# Patient Record
Sex: Female | Born: 1995 | Race: Black or African American | Hispanic: No | Marital: Married | Smoking: Never smoker
Health system: Southern US, Community
[De-identification: ages and names within clinical notes are randomized; demographics above are authoritative.]

## PROBLEM LIST (undated history)

## (undated) DIAGNOSIS — D649 Anemia, unspecified: Secondary | ICD-10-CM

## (undated) DIAGNOSIS — L732 Hidradenitis suppurativa: Secondary | ICD-10-CM

## (undated) DIAGNOSIS — O139 Gestational [pregnancy-induced] hypertension without significant proteinuria, unspecified trimester: Secondary | ICD-10-CM

## (undated) DIAGNOSIS — G971 Other reaction to spinal and lumbar puncture: Secondary | ICD-10-CM

## (undated) HISTORY — PX: KNEE ARTHROSCOPY: SUR90

## (undated) HISTORY — DX: Other reaction to spinal and lumbar puncture: G97.1

---
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
## 2016-06-30 ENCOUNTER — Emergency Department (HOSPITAL_COMMUNITY)
Admission: EM | Admit: 2016-06-30 | Discharge: 2016-06-30 | Disposition: A | Discharge status: Home / Self Care | Payer: Medicaid Other | Attending: Emergency Medicine | Admitting: Emergency Medicine

## 2016-06-30 ENCOUNTER — Encounter (HOSPITAL_COMMUNITY): Payer: Self-pay | Admitting: *Deleted

## 2016-06-30 DIAGNOSIS — J029 Acute pharyngitis, unspecified: Secondary | ICD-10-CM | POA: Diagnosis not present

## 2016-06-30 DIAGNOSIS — R05 Cough: Secondary | ICD-10-CM | POA: Insufficient documentation

## 2016-06-30 DIAGNOSIS — R509 Fever, unspecified: Secondary | ICD-10-CM | POA: Diagnosis not present

## 2016-06-30 DIAGNOSIS — R69 Illness, unspecified: Secondary | ICD-10-CM

## 2016-06-30 DIAGNOSIS — R000 Tachycardia, unspecified: Secondary | ICD-10-CM | POA: Insufficient documentation

## 2016-06-30 DIAGNOSIS — R51 Headache: Secondary | ICD-10-CM | POA: Diagnosis not present

## 2016-06-30 DIAGNOSIS — J111 Influenza due to unidentified influenza virus with other respiratory manifestations: Secondary | ICD-10-CM

## 2016-06-30 HISTORY — DX: Hidradenitis suppurativa: L73.2

## 2016-06-30 LAB — I-STAT CG4 LACTIC ACID, ED: Lactic Acid, Venous: 0.62 mmol/L (ref 0.5–1.9)

## 2016-06-30 MED ORDER — ONDANSETRON HCL 4 MG/2ML IJ SOLN
4.0000 mg | Freq: Once | INTRAMUSCULAR | Status: AC
  Administered 2016-06-30: 4 mg via INTRAVENOUS
  Filled 2016-06-30: qty 2

## 2016-06-30 MED ORDER — IBUPROFEN 800 MG PO TABS
800.0000 mg | ORAL_TABLET | Freq: Once | ORAL | Status: AC
  Administered 2016-06-30: 800 mg via ORAL
  Filled 2016-06-30: qty 1

## 2016-06-30 MED ORDER — SODIUM CHLORIDE 0.9 % IV BOLUS (SEPSIS)
1000.0000 mL | Freq: Once | INTRAVENOUS | Status: AC
  Administered 2016-06-30: 1000 mL via INTRAVENOUS

## 2016-06-30 MED ORDER — ONDANSETRON HCL 4 MG PO TABS: 4.0000 mg | ORAL_TABLET | Freq: Four times a day (QID) | ORAL | 0 refills | Status: DC

## 2016-06-30 MED ORDER — BENZONATATE 100 MG PO CAPS: 200.0000 mg | ORAL_CAPSULE | Freq: Two times a day (BID) | ORAL | 0 refills | Status: DC | PRN

## 2016-06-30 MED ORDER — ACETAMINOPHEN 325 MG PO TABS
650.0000 mg | ORAL_TABLET | Freq: Once | ORAL | Status: AC | PRN
  Administered 2016-06-30: 650 mg via ORAL
  Filled 2016-06-30: qty 2

## 2016-06-30 MED ORDER — OSELTAMIVIR PHOSPHATE 75 MG PO CAPS: 75.0000 mg | ORAL_CAPSULE | Freq: Two times a day (BID) | ORAL | 0 refills | Status: DC

## 2016-06-30 NOTE — ED Triage Notes (Signed)
Pt c/o generalized body aches, fever, chills, cough, & HA onset yesterday, pt A&O x4

## 2016-06-30 NOTE — ED Provider Notes (Signed)
[facility]-EMERGENCY DEPT Provider Note   CSN: [id number] Arrival date & time: 06/30/16  [id number]     History   Chief Complaint Chief Complaint  Patient presents with  . Generalized Body Aches  . Fever    HPI Alexandra Collins is a 21 y.o. female.  Patient presents to the emergency department with chief complaint of flulike symptoms. She reports associated fevers, chills, sore throat, cough, generalized body aches, and headache. She reports sudden onset of symptoms yesterday. She has now tried taking anything to alleviate her symptoms. There are no modifying factors. She denies having gotten a flu shot this year. She denies any other associated symptoms.   The history is provided by the patient. No language interpreter was used.    Past Medical History:  Diagnosis Date  . Hydradenitis     There are no active problems to display for this patient.   Past Surgical History:  Procedure Laterality Date  . KNEE ARTHROSCOPY Right     OB History    No data available       Home Medications    Prior to Admission medications   Not on File    Family History No family history on file.  Social History Social History  Substance Use Topics  . Smoking status: Never Smoker  . Smokeless tobacco: Never Used  . Alcohol use No     Allergies   Penicillins and Sulfur   Review of Systems Review of Systems  Constitutional: Positive for chills and fever.  HENT: Positive for sore throat.   Respiratory: Positive for cough.   Neurological: Positive for headaches.  All other systems reviewed and are negative.    Physical Exam Updated Vital Signs BP 134/79   Pulse 108   Temp 101.7 F (38.7 C) (Oral)   Resp 16   Ht 5' 6.5" (1.689 m)   Wt 122.5 kg   LMP 05/31/2016   SpO2 97%   BMI 42.93 kg/m   Physical Exam Nursing note and vitals reviewed.  Constitutional: Appears well-developed and well-nourished. No distress.  HENT: Oropharynx is mildly erythematous, no tonsillar  exudates or abscess, airway intact. Eyes: Conjunctivae are normal. Pupils are equal, round, and reactive to light.  Neck: Normal range of motion and full passive range of motion without pain.  Cardiovascular: Mild tachycardia (108), normal rhythm and intact distal pulses.   Pulmonary/Chest: Effort normal and breath sounds normal. No stridor. No wheezes, rhonchi, or rales. Abdominal: Soft. There is no focal abdominal tenderness.  Musculoskeletal: Normal range of motion. Moves all extremities. Neurological: Pt is alert and oriented to person, place, and time. Sensation and strength grossly intact throughout. Skin: Skin is warm and dry. No rash noted. Pt is not diaphoretic.  Psychiatric: Normal mood and affect.    ED Treatments / Results  Labs (all labs ordered are listed, but only abnormal results are displayed) Labs Reviewed  I-STAT CG4 LACTIC ACID, ED    EKG  EKG Interpretation None       Radiology No results found.  Procedures Procedures (including critical care time)  Medications Ordered in ED Medications  acetaminophen (TYLENOL) tablet 650 mg (650 mg Oral Given 06/30/16 0812)  sodium chloride 0.9 % bolus 1,000 mL (1,000 mLs Intravenous New Bag/Given 06/30/16 0835)  sodium chloride 0.9 % bolus 1,000 mL (1,000 mLs Intravenous New Bag/Given 06/30/16 0835)  ibuprofen (ADVIL,MOTRIN) tablet 800 mg (800 mg Oral Given 06/30/16 0833)  ondansetron (ZOFRAN) injection 4 mg (4 mg Intravenous Given 06/30/16 0833)  Initial Impression / Assessment and Plan / ED Course  I have reviewed the triage vital signs and the nursing notes.  Pertinent labs & imaging results that were available during my care of the patient were reviewed by me and considered in my medical decision making (see chart for details).     Patient with flulike symptoms. She is noted to be febrile and tachycardic in triage. Will give fluids and antipyretics. Will reassess. Take acid ordered in triage is 0.62. Suspect  influenza. No evidence of septic shock.  Patient feels improved after 2 L of fluid. Her vital signs are normalizing. Patient appears appropriate for outpatient treatment. I will prescribe Tamiflu given that she is within the treatment window and would like to try this. Specific return precautions given.  Final Clinical Impressions(s) / ED Diagnoses   Final diagnoses:  Influenza-like illness    New Prescriptions New Prescriptions   BENZONATATE (TESSALON) 100 MG CAPSULE    Take 2 capsules (200 mg total) by mouth 2 (two) times daily as needed for cough.   ONDANSETRON (ZOFRAN) 4 MG TABLET    Take 1 tablet (4 mg total) by mouth every 6 (six) hours.   OSELTAMIVIR (TAMIFLU) 75 MG CAPSULE    Take 1 capsule (75 mg total) by mouth every 12 (twelve) hours.     Roxy Horseman, PA-C 06/30/16 1009    Canary Brim Tegeler, MD 06/30/16 2009

## 2016-10-25 ENCOUNTER — Encounter (HOSPITAL_COMMUNITY): Payer: Self-pay | Admitting: Emergency Medicine

## 2016-10-25 ENCOUNTER — Emergency Department (HOSPITAL_COMMUNITY)
Admission: EM | Admit: 2016-10-25 | Discharge: 2016-10-25 | Disposition: A | Discharge status: Home / Self Care | Payer: Medicaid Other | Attending: Emergency Medicine | Admitting: Emergency Medicine

## 2016-10-25 DIAGNOSIS — L509 Urticaria, unspecified: Secondary | ICD-10-CM | POA: Diagnosis not present

## 2016-10-25 DIAGNOSIS — Z7982 Long term (current) use of aspirin: Secondary | ICD-10-CM | POA: Diagnosis not present

## 2016-10-25 DIAGNOSIS — Z79899 Other long term (current) drug therapy: Secondary | ICD-10-CM | POA: Diagnosis not present

## 2016-10-25 DIAGNOSIS — R21 Rash and other nonspecific skin eruption: Secondary | ICD-10-CM | POA: Diagnosis present

## 2016-10-25 MED ORDER — PREDNISONE 20 MG PO TABS
60.0000 mg | ORAL_TABLET | Freq: Once | ORAL | Status: AC
Start: 2016-10-25 — End: 2016-10-25
  Administered 2016-10-25: 60 mg via ORAL
  Filled 2016-10-25: qty 3

## 2016-10-25 MED ORDER — DIPHENHYDRAMINE HCL 25 MG PO CAPS
25.0000 mg | ORAL_CAPSULE | Freq: Once | ORAL | Status: AC
  Administered 2016-10-25: 25 mg via ORAL
  Filled 2016-10-25: qty 1

## 2016-10-25 MED ORDER — PREDNISONE 20 MG PO TABS: 40.0000 mg | ORAL_TABLET | Freq: Every day | ORAL | 0 refills | Status: DC

## 2016-10-25 NOTE — Discharge Instructions (Signed)
Continue Benadryl 25mg  over the counter every 6 hours as needed Continue Prednisone for the next 4 days Return for worsening symptoms

## 2016-10-25 NOTE — ED Triage Notes (Signed)
Pt reports eating shrimp last night, woke up today with hives on LUE, L side of face.  Pt now reports itching to lower lip.  Reports hx of "itchy tongue" after eating shrimp in past.

## 2016-10-26 NOTE — ED Provider Notes (Signed)
[facility]PT Provider Note   CSN: [id number] Arrival date & time: 10/25/16  0703     History   Chief Complaint Chief Complaint  Patient presents with  . Urticaria   HPI Alexandra Collins is a 21 y.o. female who presents with a rash. Past medical history significant for shrimp allergy. She denies history of anaphylaxis but was prescribed an EpiPen as precaution. She states that last night she ate shrimp and this morning she woke up with hives on her arms much side of her face. She reports an itchy lower lip. She denies fever, throat swelling, inability to swallow, drooling, facial swelling, shortness of breath, wheezing, abdominal pain, nausea or vomiting. She has not taken any medicine prior to arrival  HPI  Past Medical History:  Diagnosis Date  . Hydradenitis     There are no active problems to display for this patient.   Past Surgical History:  Procedure Laterality Date  . KNEE ARTHROSCOPY Right     OB History    No data available       Home Medications    Prior to Admission medications   Medication Sig Start Date End Date Taking? Authorizing Provider  aspirin-sod bicarb-citric acid (ALKA-SELTZER) 325 MG TBEF tablet Take 325 mg by mouth every 6 (six) hours as needed (cold symptoms).   Yes [provider]  etonogestrel (NEXPLANON) 68 MG IMPL implant Inject 68 mg into the muscle once.   Yes [provider]  Multiple Vitamin (MULTIVITAMIN) tablet Take 1 tablet by mouth daily.   Yes [provider]  benzonatate (TESSALON) 100 MG capsule Take 2 capsules (200 mg total) by mouth 2 (two) times daily as needed for cough. Patient not taking: Reported on 10/25/2016 06/30/16   Roxy HorsemanBrowning, Robert, PA-C  ondansetron (ZOFRAN) 4 MG tablet Take 1 tablet (4 mg total) by mouth every 6 (six) hours. Patient not taking: Reported on 10/25/2016 06/30/16   Roxy HorsemanBrowning, Robert, PA-C  oseltamivir (TAMIFLU) 75 MG capsule Take 1 capsule (75 mg total) by mouth every 12  (twelve) hours. Patient not taking: Reported on 10/25/2016 06/30/16   Roxy HorsemanBrowning, Robert, PA-C  predniSONE (DELTASONE) 20 MG tablet Take 2 tablets (40 mg total) by mouth daily. 10/25/16   Bethel BornGekas, Vermelle Cammarata Marie, PA-C    Family History No family history on file.  Social History Social History  Substance Use Topics  . Smoking status: Never Smoker  . Smokeless tobacco: Never Used  . Alcohol use No     Allergies   Ceclor [cefaclor]; Garlic; Penicillins; Shellfish allergy; Sulfamethoxazole; Sulfur; and Amoxicillin-pot clavulanate   Review of Systems Review of Systems  Constitutional: Negative for fever.  HENT: Negative for drooling, facial swelling and trouble swallowing.   Respiratory: Negative for shortness of breath and wheezing.   Gastrointestinal: Negative for abdominal pain, nausea and vomiting.  Skin: Positive for rash.  All other systems reviewed and are negative.    Physical Exam Updated Vital Signs BP 122/63 (BP Location: Right Arm)   Pulse 64   Temp 98.4 F (36.9 C)   Resp 16   Ht 5' 6.5" (1.689 m)   Wt 126.1 kg (278 lb)   LMP 08/25/2016 (Approximate)   SpO2 100%   BMI 44.20 kg/m   Physical Exam  Constitutional: She is oriented to person, place, and time. She appears well-developed and well-nourished. No distress.  Talking in full sentences  HENT:  Head: Normocephalic and atraumatic.  Mouth/Throat: Uvula is midline. No posterior oropharyngeal edema.  Eyes: Conjunctivae are normal.  Pupils are equal, round, and reactive to light. Right eye exhibits no discharge. Left eye exhibits no discharge. No scleral icterus.  Neck: Normal range of motion.  Cardiovascular: Normal rate and regular rhythm.  Exam reveals no gallop and no friction rub.   No murmur heard. Pulmonary/Chest: Effort normal and breath sounds normal. No respiratory distress. She has no wheezes. She has no rales. She exhibits no tenderness.  Abdominal: She exhibits no distension.  Neurological: She is  alert and oriented to person, place, and time.  Skin: Skin is warm and dry. Rash (Hives over antecubital space of bilateral arms.) noted.  Psychiatric: She has a normal mood and affect. Her behavior is normal.  Nursing note and vitals reviewed.    ED Treatments / Results  Labs (all labs ordered are listed, but only abnormal results are displayed) Labs Reviewed - No data to display  EKG  EKG Interpretation None       Radiology No results found.  Procedures Procedures (including critical care time)  Medications Ordered in ED Medications  diphenhydrAMINE (BENADRYL) capsule 25 mg (25 mg Oral Given 10/25/16 0844)  predniSONE (DELTASONE) tablet 60 mg (60 mg Oral Given 10/25/16 0844)     Initial Impression / Assessment and Plan / ED Course  I have reviewed the triage vital signs and the nursing notes.  Pertinent labs & imaging results that were available during my care of the patient were reviewed by me and considered in my medical decision making (see chart for details).  21 year old female with hives. No signs of anaphylaxis. Vital signs are normal. Benadryl and steroids given. We'll reassess  After medication patient's symptoms are improved. Will discharge with Benadryl and short course of steroids. Return precautions given.  Final Clinical Impressions(s) / ED Diagnoses   Final diagnoses:  Urticaria    New Prescriptions Discharge Medication List as of 10/25/2016  9:50 AM    START taking these medications   Details  predniSONE (DELTASONE) 20 MG tablet Take 2 tablets (40 mg total) by mouth daily., Starting Mon 10/25/2016, Print         Bethel Born, PA-C 10/26/16 1610    Azalia Bilis, MD 10/26/16 [phone]

## 2017-07-22 ENCOUNTER — Emergency Department (HOSPITAL_COMMUNITY): Payer: BLUE CROSS/BLUE SHIELD

## 2017-07-22 ENCOUNTER — Encounter (HOSPITAL_COMMUNITY): Payer: Self-pay

## 2017-07-22 ENCOUNTER — Emergency Department (HOSPITAL_COMMUNITY)
Admission: EM | Admit: 2017-07-22 | Discharge: 2017-07-23 | Disposition: A | Discharge status: Home / Self Care | Payer: BLUE CROSS/BLUE SHIELD | Attending: Emergency Medicine | Admitting: Emergency Medicine

## 2017-07-22 ENCOUNTER — Other Ambulatory Visit: Payer: Self-pay

## 2017-07-22 DIAGNOSIS — Z7982 Long term (current) use of aspirin: Secondary | ICD-10-CM | POA: Diagnosis not present

## 2017-07-22 DIAGNOSIS — Y929 Unspecified place or not applicable: Secondary | ICD-10-CM | POA: Diagnosis not present

## 2017-07-22 DIAGNOSIS — Y939 Activity, unspecified: Secondary | ICD-10-CM | POA: Insufficient documentation

## 2017-07-22 DIAGNOSIS — X58XXXA Exposure to other specified factors, initial encounter: Secondary | ICD-10-CM | POA: Diagnosis not present

## 2017-07-22 DIAGNOSIS — S91341A Puncture wound with foreign body, right foot, initial encounter: Secondary | ICD-10-CM | POA: Insufficient documentation

## 2017-07-22 DIAGNOSIS — T148XXA Other injury of unspecified body region, initial encounter: Secondary | ICD-10-CM

## 2017-07-22 DIAGNOSIS — Y999 Unspecified external cause status: Secondary | ICD-10-CM | POA: Insufficient documentation

## 2017-07-22 NOTE — ED Triage Notes (Signed)
Patient from home after having an acrylic bead get stuck in right foot.  Bleeding controlled and bead able to be seen.  Tetanus given 3 yr ago.

## 2017-07-23 DIAGNOSIS — S91341A Puncture wound with foreign body, right foot, initial encounter: Secondary | ICD-10-CM | POA: Diagnosis not present

## 2017-07-23 MED ORDER — LIDOCAINE-EPINEPHRINE (PF) 2 %-1:200000 IJ SOLN
10.0000 mL | Freq: Once | INTRAMUSCULAR | Status: AC
  Administered 2017-07-23: 10 mL via INTRADERMAL
  Filled 2017-07-23: qty 20

## 2017-07-23 NOTE — ED Provider Notes (Signed)
[facility] EMERGENCY DEPARTMENT Provider Note   CSN: [id number] Arrival date & time: 07/22/17  2211     History   Chief Complaint Chief Complaint  Patient presents with  . Foreign Body in Skin    right foot    HPI Alexandra Collins is a 22 y.o. female.  HPI Alexandra Collins is a 22 y.o. female presents to emergency department with complaint of right foot injury.  Patient states she stepped on a acrylic bead from a broken bracelet and it is lodged in the bottom of her foot.  She states that it is very painful and she is unable to see it or pull it out.  She denies any numbness or weakness distal to the injury.  She denies any other injuries.  She believes her tetanus was 4 years ago no treatment prior to coming in.  Bleeding was stopped with pressure  Past Medical History:  Diagnosis Date  . Hydradenitis     There are no active problems to display for this patient.   Past Surgical History:  Procedure Laterality Date  . KNEE ARTHROSCOPY Right     OB History    No data available       Home Medications    Prior to Admission medications   Medication Sig Start Date End Date Taking? Authorizing Provider  aspirin-sod bicarb-citric acid (ALKA-SELTZER) 325 MG TBEF tablet Take 325 mg by mouth every 6 (six) hours as needed (cold symptoms).    [provider]  benzonatate (TESSALON) 100 MG capsule Take 2 capsules (200 mg total) by mouth 2 (two) times daily as needed for cough. Patient not taking: Reported on 10/25/2016 06/30/16   Roxy Horseman, PA-C  etonogestrel (NEXPLANON) 68 MG IMPL implant Inject 68 mg into the muscle once.    [provider]  Multiple Vitamin (MULTIVITAMIN) tablet Take 1 tablet by mouth daily.    [provider]  ondansetron (ZOFRAN) 4 MG tablet Take 1 tablet (4 mg total) by mouth every 6 (six) hours. Patient not taking: Reported on 10/25/2016 06/30/16   Roxy Horseman, PA-C  oseltamivir (TAMIFLU) 75 MG capsule  Take 1 capsule (75 mg total) by mouth every 12 (twelve) hours. Patient not taking: Reported on 10/25/2016 06/30/16   Roxy Horseman, PA-C  predniSONE (DELTASONE) 20 MG tablet Take 2 tablets (40 mg total) by mouth daily. 10/25/16   Bethel Born, PA-C    Family History History reviewed. No pertinent family history.  Social History Social History   Tobacco Use  . Smoking status: Never Smoker  . Smokeless tobacco: Never Used  Substance Use Topics  . Alcohol use: No  . Drug use: No     Allergies   Ceclor [cefaclor]; Garlic; Penicillins; Shellfish allergy; Sulfamethoxazole; Sulfur; and Amoxicillin-pot clavulanate   Review of Systems Review of Systems  Constitutional: Negative for chills and fever.  Respiratory: Negative for cough, chest tightness and shortness of breath.   Cardiovascular: Negative for chest pain, palpitations and leg swelling.  Musculoskeletal: Negative for arthralgias and myalgias.  Skin: Positive for wound. Negative for rash.  Neurological: Negative for weakness.  All other systems reviewed and are negative.    Physical Exam Updated Vital Signs BP 138/72 (BP Location: Left Arm)   Pulse 83   Temp 98.1 F (36.7 C) (Oral)   Resp 18   LMP 07/07/2017 (Within Weeks)   SpO2 100%   Physical Exam  Constitutional: She appears well-developed and well-nourished. No distress.  Eyes: Conjunctivae are normal.  Neck: Neck supple.  Musculoskeletal:  Small foreign body palpated just underneath the skin in the right foot, specifically over the a soft pad of the fourth MTP joint.  Dried blood to the bottom of the foot.  Full range of motion of all toes.  Neurological: She is alert.  Skin: Skin is warm and dry.  Nursing note and vitals reviewed.    ED Treatments / Results  Labs (all labs ordered are listed, but only abnormal results are displayed) Labs Reviewed - No data to display  EKG  EKG Interpretation None       Radiology Dg Foot Complete  Right  Result Date: 07/22/2017 CLINICAL DATA:  Stepped on acrylic bead from broken bracelet. Bead is stuck in patient's right foot. Initial encounter. EXAM: RIGHT FOOT COMPLETE - 3+ VIEW COMPARISON:  None. FINDINGS: There is no evidence of fracture or dislocation. The joint spaces are preserved. There is no evidence of talar subluxation; the subtalar joint is unremarkable in appearance. An os naviculare is noted. A high-density 6 mm foreign body is noted at the plantar soft tissues near the bases of the third and fourth toes. This is just deep to the skin surface. IMPRESSION: 1. No evidence of fracture or dislocation. 2. High-density 6 mm foreign body in the plantar soft tissues near the bases of the third and fourth toes. This is just deep to the skin surface. Electronically Signed   By: Roanna RaiderJeffery  Chang M.D.   On: 07/22/2017 23:35    Procedures .Foreign Body Removal Date/Time: 07/23/2017 12:32 AM Performed by: Jaynie CrumbleKirichenko, Louie Meaders, PA-C Authorized by: Jaynie CrumbleKirichenko, Raychell Holcomb, PA-C  Consent: Verbal consent obtained. Patient understanding: patient states understanding of the procedure being performed Patient consent: the patient's understanding of the procedure matches consent given Patient identity confirmed: verbally with patient and arm band Body area: skin Anesthesia: local infiltration  Anesthesia: Local Anesthetic: lidocaine 2% with epinephrine Anesthetic total: 2 mL Patient cooperative: yes Localization method: probed and visualized Removal mechanism: forceps and irrigation Dressing: antibiotic ointment Depth: subcutaneous Complexity: simple 1 objects recovered. Objects recovered: bead Post-procedure assessment: foreign body removed Patient tolerance: Patient tolerated the procedure well with no immediate complications   (including critical care time)  Medications Ordered in ED Medications  lidocaine-EPINEPHrine (XYLOCAINE W/EPI) 2 %-1:200000 (PF) injection 10 mL (10 mLs Intradermal  Given by Other 07/23/17 0008)     Initial Impression / Assessment and Plan / ED Course  I have reviewed the triage vital signs and the nursing notes.  Pertinent labs & imaging results that were available during my care of the patient were reviewed by me and considered in my medical decision making (see chart for details).     Patient with foreign body in the right foot.  Numbed with lidocaine, 4 body removed.  Cleaned thoroughly.  Dressing and antibiotic ointment applied.  Wound care at home discussed.  Follow-up as needed. Tetanus up-to-date  Vitals:   07/22/17 2229  BP: 138/72  Pulse: 83  Resp: 18  Temp: 98.1 F (36.7 C)  TempSrc: Oral  SpO2: 100%    Final Clinical Impressions(s) / ED Diagnoses   Final diagnoses:  Foreign body in skin  Puncture wound    ED Discharge Orders    None      Jaynie CrumbleKirichenko, Noora Locascio, PA-C 07/23/17 0035  Glynn Octaveancour, Stephen, MD 07/23/17 [phone]

## 2017-07-23 NOTE — ED Notes (Signed)
Patient left at this time with all belongings. 

## 2017-07-23 NOTE — Discharge Instructions (Signed)
Keep your wound clean and dry.  Wash with soap and water.  Bacitracin twice a day.  Follow-up as needed.  Watch for any signs of infection.

## 2017-11-29 ENCOUNTER — Other Ambulatory Visit: Payer: Self-pay

## 2017-11-29 ENCOUNTER — Encounter (HOSPITAL_COMMUNITY): Payer: Self-pay | Admitting: Emergency Medicine

## 2017-11-29 ENCOUNTER — Ambulatory Visit (HOSPITAL_COMMUNITY)
Admission: EM | Admit: 2017-11-29 | Discharge: 2017-11-29 | Disposition: A | Discharge status: Home / Self Care | Payer: BLUE CROSS/BLUE SHIELD | Attending: Family Medicine | Admitting: Family Medicine

## 2017-11-29 DIAGNOSIS — R05 Cough: Secondary | ICD-10-CM | POA: Diagnosis not present

## 2017-11-29 DIAGNOSIS — R51 Headache: Secondary | ICD-10-CM

## 2017-11-29 DIAGNOSIS — B9789 Other viral agents as the cause of diseases classified elsewhere: Secondary | ICD-10-CM | POA: Diagnosis not present

## 2017-11-29 DIAGNOSIS — J069 Acute upper respiratory infection, unspecified: Secondary | ICD-10-CM

## 2017-11-29 MED ORDER — FLUTICASONE PROPIONATE 50 MCG/ACT NA SUSP: 2.0000 | Freq: Every day | NASAL | 0 refills | Status: DC

## 2017-11-29 MED ORDER — IPRATROPIUM BROMIDE 0.06 % NA SOLN: 2.0000 | Freq: Four times a day (QID) | NASAL | 0 refills | Status: DC

## 2017-11-29 MED ORDER — BENZONATATE 100 MG PO CAPS: 100.0000 mg | ORAL_CAPSULE | Freq: Three times a day (TID) | ORAL | 0 refills | Status: DC

## 2017-11-29 NOTE — Discharge Instructions (Signed)
Tessalon for cough. Start flonase, atrovent nasal spray for nasal congestion/drainage. As discussed, start an allergy medicine such as zyrtec, allegra, claritin for the next 1-3 months. You can use over the counter nasal saline rinse such as neti pot for nasal congestion. Keep hydrated, your urine should be clear to pale yellow in color. Tylenol/motrin for fever and pain. Monitor for any worsening of symptoms, chest pain, shortness of breath, wheezing, swelling of the throat, follow up for reevaluation.   For sore throat/cough try using a honey-based tea. Use 3 teaspoons of honey with juice squeezed from half lemon. Place shaved pieces of ginger into 1/2-1 cup of water and warm over stove top. Then mix the ingredients and repeat every 4 hours as needed.

## 2017-11-29 NOTE — ED Provider Notes (Signed)
[facility]    CSN: [id number] Arrival date & time: 11/29/17  1644     History   Chief Complaint Chief Complaint  Patient presents with  . Cough    HPI Alexandra Collins is a 22 y.o. female.   22 year old female comes in for URI symptoms.  States came in because 2 to 3 days ago started having congestion, dry cough, headache, neck pain, rhinorrhea.  However, for the past 3 weeks, she has had hoarseness with intermittent congestion.  She denies fever, chills, night sweats.  Does endorse sneezing, itchy watery eyes.  Denies abdominal pain, nausea, vomiting.  Headache is through the whole head and extends down to the neck.  Denies neck stiffness, photophobia, altered mental status.  Took ibuprofen with good relief of neck pain and headache.  States when symptoms first started 3 weeks ago, started on antihistamine for 3 days, but then discontinued as symptoms did not improve.  Since current symptom onset 2 to 3 days ago, has had OTC cold medication without relief.  Never smoker, passive smoker at home.  Works at daycare with positive sick contact.     Past Medical History:  Diagnosis Date  . Hydradenitis     There are no active problems to display for this patient.   Past Surgical History:  Procedure Laterality Date  . KNEE ARTHROSCOPY Right     OB History   None      Home Medications    Prior to Admission medications   Medication Sig Start Date End Date Taking? Authorizing Provider  aspirin-sod bicarb-citric acid (ALKA-SELTZER) 325 MG TBEF tablet Take 325 mg by mouth every 6 (six) hours as needed (cold symptoms).   Yes [provider]  doxycycline (VIBRAMYCIN) 100 MG capsule Take 100 mg by mouth 2 (two) times daily.   Yes [provider]  etonogestrel (NEXPLANON) 68 MG IMPL implant Inject 68 mg into the muscle once.   Yes [provider]  ferrous sulfate 325 (65 FE) MG tablet Take 325 mg by mouth daily with breakfast.   Yes [provider]  Multiple Vitamin (MULTIVITAMIN) tablet Take 1 tablet by mouth daily.   Yes [provider]  benzonatate (TESSALON) 100 MG capsule Take 1 capsule (100 mg total) by mouth every 8 (eight) hours. 11/29/17   Cathie HoopsYu, Jaydalynn Olivero V, PA-C  fluticasone (FLONASE) 50 MCG/ACT nasal spray Place 2 sprays into both nostrils daily. 11/29/17   Cathie HoopsYu, Janathan Bribiesca V, PA-C  ipratropium (ATROVENT) 0.06 % nasal spray Place 2 sprays into both nostrils 4 (four) times daily. 11/29/17   Belinda FisherYu, Ramiro Pangilinan V, PA-C    Family History History reviewed. No pertinent family history.  Social History Social History   Tobacco Use  . Smoking status: Never Smoker  . Smokeless tobacco: Never Used  Substance Use Topics  . Alcohol use: No  . Drug use: No     Allergies   Ceclor [cefaclor]; Garlic; Penicillins; Shellfish allergy; Sulfamethoxazole; Sulfur; and Amoxicillin-pot clavulanate   Review of Systems Review of Systems  Reason unable to perform ROS: See HPI as above.     Physical Exam Triage Vital Signs ED Triage Vitals  Enc Vitals Group     BP 11/29/17 1713 140/75     Pulse Rate 11/29/17 1713 74     Resp 11/29/17 1713 20     Temp 11/29/17 1713 98 F (36.7 C)     Temp Source 11/29/17 1713 Oral     SpO2 11/29/17 1713 100 %  Weight --      Height --      Head Circumference --      Peak Flow --      Pain Score 11/29/17 1711 0     Pain Loc --      Pain Edu? --      Excl. in GC? --    No data found.  Updated Vital Signs BP 140/75 (BP Location: Left Arm)   Pulse 74   Temp 98 F (36.7 C) (Oral)   Resp 20   SpO2 100%   Physical Exam  Constitutional: She is oriented to person, place, and time. She appears well-developed and well-nourished. No distress.  HENT:  Head: Normocephalic and atraumatic.  Right Ear: Tympanic membrane, external ear and ear canal normal. Tympanic membrane is not erythematous and not bulging.  Left Ear: Tympanic membrane, external ear and ear canal normal. Tympanic membrane is not  erythematous and not bulging.  Nose: Mucosal edema and rhinorrhea present. Right sinus exhibits no maxillary sinus tenderness and no frontal sinus tenderness. Left sinus exhibits no maxillary sinus tenderness and no frontal sinus tenderness.  Mouth/Throat: Uvula is midline, oropharynx is clear and moist and mucous membranes are normal.  Eyes: Pupils are equal, round, and reactive to light. Conjunctivae are normal.  Neck: Normal range of motion. Neck supple. No spinous process tenderness and no muscular tenderness present. Normal range of motion present.  Cardiovascular: Normal rate, regular rhythm and normal heart sounds. Exam reveals no gallop and no friction rub.  No murmur heard. Pulmonary/Chest: Effort normal and breath sounds normal. She has no decreased breath sounds. She has no wheezes. She has no rhonchi. She has no rales.  Lymphadenopathy:    She has no cervical adenopathy.  Neurological: She is alert and oriented to person, place, and time.  Skin: Skin is warm and dry.  Psychiatric: She has a normal mood and affect. Her behavior is normal. Judgment normal.    UC Treatments / Results  Labs (all labs ordered are listed, but only abnormal results are displayed) Labs Reviewed - No data to display  EKG None  Radiology No results found.  Procedures Procedures (including critical care time)  Medications Ordered in UC Medications - No data to display  Initial Impression / Assessment and Plan / UC Course  I have reviewed the triage vital signs and the nursing notes.  Pertinent labs & imaging results that were available during my care of the patient were reviewed by me and considered in my medical decision making (see chart for details).    Discussed with patient history and exam most consistent with viral URI. Symptomatic treatment as needed. Push fluids. Return precautions given.   Final Clinical Impressions(s) / UC Diagnoses   Final diagnoses:  Viral URI with cough     ED Prescriptions    Medication Sig Dispense Auth. Provider   ipratropium (ATROVENT) 0.06 % nasal spray Place 2 sprays into both nostrils 4 (four) times daily. 15 mL Nesiah Jump V, PA-C   fluticasone (FLONASE) 50 MCG/ACT nasal spray Place 2 sprays into both nostrils daily. 1 g Gwendola Hornaday V, PA-C   benzonatate (TESSALON) 100 MG capsule Take 1 capsule (100 mg total) by mouth every 8 (eight) hours. 21 capsule Threasa Alpha, [place] 11/29/17 1744

## 2017-11-29 NOTE — ED Triage Notes (Signed)
[facility] a [facility] of a dry cough, congestion and laryngitis x 3 weeks.

## 2018-05-26 ENCOUNTER — Ambulatory Visit (HOSPITAL_COMMUNITY)
Admission: EM | Admit: 2018-05-26 | Discharge: 2018-05-26 | Disposition: A | Discharge status: Home / Self Care | Payer: BLUE CROSS/BLUE SHIELD

## 2018-07-21 ENCOUNTER — Emergency Department (HOSPITAL_BASED_OUTPATIENT_CLINIC_OR_DEPARTMENT_OTHER)
Admission: EM | Admit: 2018-07-21 | Discharge: 2018-07-21 | Disposition: A | Discharge status: Home / Self Care | Payer: BLUE CROSS/BLUE SHIELD | Attending: Emergency Medicine | Admitting: Emergency Medicine

## 2018-07-21 ENCOUNTER — Other Ambulatory Visit: Payer: Self-pay

## 2018-07-21 ENCOUNTER — Encounter (HOSPITAL_BASED_OUTPATIENT_CLINIC_OR_DEPARTMENT_OTHER): Payer: Self-pay | Admitting: Emergency Medicine

## 2018-07-21 ENCOUNTER — Emergency Department (HOSPITAL_BASED_OUTPATIENT_CLINIC_OR_DEPARTMENT_OTHER): Payer: BLUE CROSS/BLUE SHIELD

## 2018-07-21 DIAGNOSIS — J020 Streptococcal pharyngitis: Secondary | ICD-10-CM | POA: Insufficient documentation

## 2018-07-21 DIAGNOSIS — Z79899 Other long term (current) drug therapy: Secondary | ICD-10-CM | POA: Diagnosis not present

## 2018-07-21 DIAGNOSIS — R05 Cough: Secondary | ICD-10-CM | POA: Diagnosis present

## 2018-07-21 LAB — GROUP A STREP BY PCR: Group A Strep by PCR: DETECTED — AB

## 2018-07-21 MED ORDER — AZITHROMYCIN 250 MG PO TABS: 250.0000 mg | ORAL_TABLET | Freq: Every day | ORAL | 0 refills | Status: DC

## 2018-07-21 MED ORDER — PREDNISONE 50 MG PO TABS
60.0000 mg | ORAL_TABLET | Freq: Once | ORAL | Status: AC
  Administered 2018-07-21: 60 mg via ORAL
  Filled 2018-07-21: qty 1

## 2018-07-21 NOTE — ED Triage Notes (Signed)
Patient presents with co cough, sore throat, sneezing, and shortness of breath; called EMS this am for same; denied transport and came by POV for eval; nad noted; ambulatory with steady gait. Denies fever at home.

## 2018-07-21 NOTE — ED Provider Notes (Signed)
[facility] EMERGENCY DEPARTMENT Provider Note   CSN: [id number] Arrival date & time: 07/21/18  7829    History   Chief Complaint Chief Complaint  Patient presents with  . Cough    HPI Alexandra Collins is a 23 y.o. female.     The history is provided by the patient.  Cough  Cough characteristics:  Dry and non-productive Severity:  Moderate Onset quality:  Gradual Duration:  1 week Timing:  Intermittent Progression:  Unchanged Chronicity:  New Context: sick contacts and upper respiratory infection   Relieved by:  Nothing Worsened by:  Lying down and activity Ineffective treatments:  Beta-agonist inhaler Associated symptoms: rhinorrhea, shortness of breath, sinus congestion and sore throat   Associated symptoms: no chest pain, no fever and no wheezing   Associated symptoms comment:  Sore throat starting yesterday and SOB starting today.  Better with sitting straight up or standing.  Worse with lying down or walking.  Tried albuterol at home today but no help.  [facility] and given meds for conjunctivitis. Risk factors: no recent travel   Risk factors comment:  Has not traveled outside this town recently.  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 Hydradenitis     There are no active problems to display for this patient.   Past Surgical History:  Procedure Laterality Date  . KNEE ARTHROSCOPY Right      OB History   No obstetric history on file.      Home Medications    Prior to Admission medications   Medication Sig Start Date End Date Taking? Authorizing Provider  norgestimate-ethinyl estradiol (ORTHO-CYCLEN,SPRINTEC,PREVIFEM) 0.25-35 MG-MCG tablet Take by mouth. 05/16/18  Yes [provider]  aspirin-sod bicarb-citric acid (ALKA-SELTZER) 325 MG TBEF tablet Take 325 mg by mouth every 6 (six) hours as needed (cold symptoms).    [provider]   benzonatate (TESSALON) 100 MG capsule Take 1 capsule (100 mg total) by mouth every 8 (eight) hours. 11/29/17   Cathie Hoops, Amy V, PA-C  doxycycline (VIBRAMYCIN) 100 MG capsule Take 100 mg by mouth 2 (two) times daily.    [provider]  etonogestrel (NEXPLANON) 68 MG IMPL implant Inject 68 mg into the muscle once.    [provider]  ferrous sulfate 325 (65 FE) MG tablet Take 325 mg by mouth daily with breakfast.    [provider]  fluticasone (FLONASE) 50 MCG/ACT nasal spray Place 2 sprays into both nostrils daily. 11/29/17   Cathie Hoops, Amy V, PA-C  ipratropium (ATROVENT) 0.06 % nasal spray Place 2 sprays into both nostrils 4 (four) times daily. 11/29/17   Cathie Hoops, Amy V, PA-C  Multiple Vitamin (MULTIVITAMIN) tablet Take 1 tablet by mouth daily.    [provider]    Family History History reviewed. No pertinent family history.  Social History Social History   Tobacco Use  . Smoking status: Never Smoker  . Smokeless tobacco: Never Used  Substance Use Topics  . Alcohol use: No  . Drug use: No     Allergies   Ceclor [cefaclor]; Garlic; Penicillins; Shellfish allergy; Sulfamethoxazole; Sulfur; and Amoxicillin-pot clavulanate   Review of Systems Review of Systems  Constitutional: Negative for fever.  HENT: Positive for rhinorrhea and sore throat.   Respiratory: Positive for cough and shortness of breath. Negative for wheezing.   Cardiovascular: Negative for chest pain.  All other systems reviewed and are negative.  Physical Exam Updated Vital Signs BP 133/80 (BP Location: Right Arm)   Pulse (!) 101   Temp 98.4 F (36.9 C) (Oral)   Resp 20   Ht 5\' 6"  (1.676 m)   Wt (!) 137.4 kg   LMP 07/18/2018   SpO2 100%   BMI 48.91 kg/m   Physical Exam Vitals signs and nursing note reviewed.  Constitutional:      General: She is not in acute distress.    Appearance: She is well-developed.  HENT:     Head: Normocephalic and atraumatic.     Right Ear: Tympanic  membrane normal.     Left Ear: Tympanic membrane normal.     Nose: Congestion present.     Mouth/Throat:     Mouth: Mucous membranes are moist.     Pharynx: Posterior oropharyngeal erythema present. No oropharyngeal exudate.     Comments: Bilateral tonsillar hypertrophy Eyes:     Pupils: Pupils are equal, round, and reactive to light.  Cardiovascular:     Rate and Rhythm: Normal rate and regular rhythm.     Heart sounds: Normal heart sounds. No murmur. No friction rub.  Pulmonary:     Effort: Pulmonary effort is normal.     Breath sounds: Normal breath sounds. No wheezing or rales.  Abdominal:     General: Bowel sounds are normal. There is no distension.     Palpations: Abdomen is soft.     Tenderness: There is no abdominal tenderness. There is no guarding or rebound.  Musculoskeletal: Normal range of motion.        General: No tenderness.     Right lower leg: No edema.     Left lower leg: No edema.     Comments: No edema  Skin:    General: Skin is warm and dry.     Findings: No rash.  Neurological:     General: No focal deficit present.     Mental Status: She is alert and oriented to person, place, and time. Mental status is at baseline.     Cranial Nerves: No cranial nerve deficit.  Psychiatric:        Mood and Affect: Mood normal.        Behavior: Behavior normal.        Thought Content: Thought content normal.      ED Treatments / Results  Labs (all labs ordered are listed, but only abnormal results are displayed) Labs Reviewed  GROUP A STREP BY PCR - Abnormal; Notable for the following components:      Result Value   Group A Strep by PCR DETECTED (*)    All other components within normal limits    EKG None  Radiology Dg Chest 2 View  Result Date: 07/21/2018 CLINICAL DATA:  Cough, sore throat and sneezing. EXAM: CHEST - 2 VIEW COMPARISON:  None. FINDINGS: Lungs clear. Heart size normal. No pneumothorax or pleural fluid. No bony abnormality. IMPRESSION:  Normal chest. Electronically Signed   By: Drusilla Kanner M.D.   On: 07/21/2018 07:46    Procedures Procedures (including critical care time)  Medications Ordered in ED Medications  predniSONE (DELTASONE) tablet 60 mg (has no administration in time range)     Initial Impression / Assessment and Plan / ED Course  I have reviewed the triage vital signs and the nursing notes.  Pertinent labs & imaging results that were available during my care of the patient were reviewed by me and considered in my medical decision making (  see chart for details).       Pt with symptoms consistent with viral URI.  Well appearing here.  No signs of breathing difficulty with normal resp rate and O2 sat.  Pt's breath sounds are clear without wheezing.  Breath sounds are clear bilaterally and low suspicion for PNA, PTX.  Minimal risk for PE other than birth control.  No signs of fluid overload.  Pt used inhaler before arrival which would explain tachycardia.  No travel or higher risk factors for COVID 19.  Mild tonsillar enlargement but no exudate or lymph nodes present but erythema.  No signs of otitis or abnormal abdominal findings.   CXR wnl and rapid strep pending.  Pt given dose of steroids.  pt to return with any further problems. Strep positive and pt states she gets it frequently.  Pt given azithro due to abx allergies and f.u with ENT   Final Clinical Impressions(s) / ED Diagnoses   Final diagnoses:  Strep pharyngitis    ED Discharge Orders         Ordered    azithromycin (ZITHROMAX) 250 MG tablet  Daily     07/21/18 6553           Gwyneth Sprout, MD 07/21/18 [id number]

## 2018-07-26 ENCOUNTER — Ambulatory Visit: Payer: BLUE CROSS/BLUE SHIELD | Admitting: Registered"

## 2018-09-27 LAB — CYTOLOGY - PAP: Pap: NEGATIVE

## 2018-11-27 LAB — OB RESULTS CONSOLE ANTIBODY SCREEN: Antibody Screen: NEGATIVE

## 2018-11-27 LAB — OB RESULTS CONSOLE ABO/RH: RH Type: POSITIVE

## 2018-12-21 DIAGNOSIS — Z3490 Encounter for supervision of normal pregnancy, unspecified, unspecified trimester: Secondary | ICD-10-CM | POA: Insufficient documentation

## 2018-12-21 DIAGNOSIS — O99210 Obesity complicating pregnancy, unspecified trimester: Secondary | ICD-10-CM | POA: Insufficient documentation

## 2018-12-21 LAB — DRUG SCREEN, URINE: Drug Screen, Urine: NEGATIVE

## 2018-12-21 LAB — URINE CULTURE: Urine Culture, OB: NEGATIVE

## 2018-12-21 LAB — OB RESULTS CONSOLE RUBELLA ANTIBODY, IGM: Rubella: IMMUNE

## 2018-12-21 LAB — SICKLE CELL SCREEN: Sickle Cell, Prenatal: NEGATIVE

## 2018-12-21 LAB — OB RESULTS CONSOLE GC/CHLAMYDIA
Chlamydia: NEGATIVE
Gonorrhea: NEGATIVE

## 2018-12-21 LAB — OB RESULTS CONSOLE RPR: RPR: NONREACTIVE

## 2018-12-21 LAB — OB RESULTS CONSOLE HIV ANTIBODY (ROUTINE TESTING): HIV: NONREACTIVE

## 2018-12-21 LAB — OB RESULTS CONSOLE HEPATITIS B SURFACE ANTIGEN: Hepatitis B Surface Ag: NEGATIVE

## 2019-01-13 ENCOUNTER — Inpatient Hospital Stay (HOSPITAL_COMMUNITY)
Admission: AD | Admit: 2019-01-13 | Discharge: 2019-01-13 | Disposition: A | Discharge status: Home / Self Care | Payer: BC Managed Care – PPO | Attending: Obstetrics and Gynecology | Admitting: Obstetrics and Gynecology

## 2019-01-13 ENCOUNTER — Other Ambulatory Visit: Payer: Self-pay

## 2019-01-13 ENCOUNTER — Inpatient Hospital Stay (HOSPITAL_COMMUNITY): Payer: Medicaid Other

## 2019-01-13 ENCOUNTER — Encounter (HOSPITAL_COMMUNITY): Payer: Self-pay

## 2019-01-13 DIAGNOSIS — R1011 Right upper quadrant pain: Secondary | ICD-10-CM | POA: Insufficient documentation

## 2019-01-13 DIAGNOSIS — Z880 Allergy status to penicillin: Secondary | ICD-10-CM | POA: Insufficient documentation

## 2019-01-13 DIAGNOSIS — O26891 Other specified pregnancy related conditions, first trimester: Secondary | ICD-10-CM | POA: Insufficient documentation

## 2019-01-13 DIAGNOSIS — R1013 Epigastric pain: Secondary | ICD-10-CM | POA: Insufficient documentation

## 2019-01-13 DIAGNOSIS — Z3A11 11 weeks gestation of pregnancy: Secondary | ICD-10-CM | POA: Diagnosis not present

## 2019-01-13 LAB — COMPREHENSIVE METABOLIC PANEL
ALT: 13 U/L (ref 0–44)
AST: 18 U/L (ref 15–41)
Albumin: 3.3 g/dL — ABNORMAL LOW (ref 3.5–5.0)
Alkaline Phosphatase: 55 U/L (ref 38–126)
Anion gap: 8 (ref 5–15)
BUN: 6 mg/dL (ref 6–20)
CO2: 24 mmol/L (ref 22–32)
Calcium: 9.1 mg/dL (ref 8.9–10.3)
Chloride: 103 mmol/L (ref 98–111)
Creatinine, Ser: 0.6 mg/dL (ref 0.44–1.00)
GFR calc Af Amer: >60 mL/min (ref >60)
GFR calc non Af Amer: >60 mL/min (ref >60)
Glucose, Bld: 91 mg/dL (ref 70–99)
Potassium: 4 mmol/L (ref 3.5–5.1)
Sodium: 135 mmol/L (ref 135–145)
Total Bilirubin: 0.3 mg/dL (ref 0.3–1.2)
Total Protein: 7 g/dL (ref 6.5–8.1)

## 2019-01-13 LAB — URINALYSIS, ROUTINE W REFLEX MICROSCOPIC
Bilirubin Urine: NEGATIVE
Glucose, UA: NEGATIVE mg/dL
Hgb urine dipstick: NEGATIVE
Ketones, ur: NEGATIVE mg/dL
Leukocytes,Ua: NEGATIVE
Nitrite: NEGATIVE
Protein, ur: NEGATIVE mg/dL
Specific Gravity, Urine: 1.023 (ref 1.005–1.030)
pH: 7 (ref 5.0–8.0)

## 2019-01-13 LAB — CBC
HCT: 35.6 % — ABNORMAL LOW (ref 36.0–46.0)
Hemoglobin: 12.2 g/dL (ref 12.0–15.0)
MCH: 30.2 pg (ref 26.0–34.0)
MCHC: 34.3 g/dL (ref 30.0–36.0)
MCV: 88.1 fL (ref 80.0–100.0)
Platelets: 331 10*3/uL (ref 150–400)
RBC: 4.04 MIL/uL (ref 3.87–5.11)
RDW: 12.1 % (ref 11.5–15.5)
WBC: 5.8 10*3/uL (ref 4.0–10.5)
nRBC: 0 % (ref 0.0–0.2)

## 2019-01-13 NOTE — MAU Note (Signed)
Alexandra Collins is a 23 y.o. at [redacted]w[redacted]d here in MAU reporting: has started [facility] through [facility] (able to see some results and confirmation of pregnancy under Care Everywhere). Around 1430, pt reports she had an abrupt onset of epigastric pain, was unable to get up, pt and SO called 911 and then refused transport. Reports she is still having this pain intermittently but it is now as severe and now she feels as if she is breathing more shallow, states she can't take a deep breath. Pt reports she does not feel short of breath.   Onset of complaint: around 1430  Pain score: 1/10  Vitals:   01/13/19 1609  BP: 130/67  Pulse: 95  Resp: 20  Temp: 98.9 F (37.2 C)  SpO2: 100%      Lab orders placed from triage: UA

## 2019-01-13 NOTE — Discharge Instructions (Signed)
Abdominal Pain During Pregnancy  Belly (abdominal) pain is common during pregnancy. There are many possible causes. Most of the time, it is not a serious problem. Other times, it can be a sign that something is wrong with the pregnancy. Always tell your doctor if you have belly pain. Follow these instructions at home:  Do not have sex or put anything in your vagina until your pain goes away completely.  Get plenty of rest until your pain gets better.  Drink enough fluid to keep your pee (urine) pale yellow.  Take over-the-counter and prescription medicines only as told by your doctor.  Keep all follow-up visits as told by your doctor. This is important. Contact a doctor if:  Your pain continues or gets worse after resting.  You have lower belly pain that: ? Comes and goes at regular times. ? Spreads to your back. ? Feels like menstrual cramps.  You have pain or burning when you pee (urinate). Get help right away if:  You have a fever or chills.  You have vaginal bleeding.  You are leaking fluid from your vagina.  You are passing tissue from your vagina.  You throw up (vomit) for more than 24 hours.  You have watery poop (diarrhea) for more than 24 hours.  Your baby is moving less than usual.  You feel very weak or faint.  You have shortness of breath.  You have very bad pain in your upper belly. Summary  Belly (abdominal) pain is common during pregnancy. There are many possible causes.  If you have belly pain during pregnancy, tell your doctor right away.  Keep all follow-up visits as told by your doctor. This is important. This information is not intended to replace advice given to you by your health care provider. Make sure you discuss any questions you have with your health care provider. Document Released: 04/14/2009 Document Revised: 08/14/2018 Document Reviewed: 07/29/2016 Elsevier Patient Education  2020 [employer]    Eating Plan for Pregnant  Women While you are pregnant, your body requires additional nutrition to help support your growing baby. You also have a higher need for some vitamins and minerals, such as folic acid, calcium, iron, and vitamin D. Eating a healthy, well-balanced diet is very important for your health and your baby's health. Your need for extra calories varies for the three 8353-month segments of your pregnancy (trimesters). For most women, it is recommended to consume:  150 extra calories a day during the first trimester.  300 extra calories a day during the second trimester.  300 extra calories a day during the third trimester. What are tips for following this plan?   Do not try to lose weight or go on a diet during pregnancy.  Limit your overall intake of foods that have "empty calories." These are foods that have little nutritional value, such as sweets, desserts, candies, and sugar-sweetened beverages.  Eat a variety of foods (especially fruits and vegetables) to get a full range of vitamins and minerals.  Take a prenatal vitamin to help meet your additional vitamin and mineral needs during pregnancy, specifically for folic acid, iron, calcium, and vitamin D.  Remember to stay active. Ask your health care provider what types of exercise and activities are safe for you.  Practice good food safety and cleanliness. Wash your hands before you eat and after you prepare raw meat. Wash all fruits and vegetables well before peeling or eating. Taking these actions can help to prevent food-borne illnesses that can  be very dangerous to your baby, such as listeriosis. Ask your health care provider for more information about listeriosis. What does 150 extra calories look like? Healthy options that provide 150 extra calories each day could be any of the following:  6-8 oz (170-230 g) of plain low-fat yogurt with  cup of berries.  1 apple with 2 teaspoons (11 g) of peanut butter.  Cut-up vegetables with  cup (60 g)  of hummus.  8 oz (230 mL) or 1 cup of low-fat chocolate milk.  1 stick of string cheese with 1 medium orange.  1 peanut butter and jelly sandwich that is made with one slice of whole-wheat bread and 1 tsp (5 g) of peanut butter. For 300 extra calories, you could eat two of those healthy options each day. What is a healthy amount of weight to gain? The right amount of weight gain for you is based on your BMI before you became pregnant. If your BMI:  Was less than 18 (underweight), you should gain 28-40 lb (13-18 kg).  Was 18-24.9 (normal), you should gain 25-35 lb (11-16 kg).  Was 25-29.9 (overweight), you should gain 15-25 lb (7-11 kg).  Was 30 or greater (obese), you should gain 11-20 lb (5-9 kg). What if I am having twins or multiples? Generally, if you are carrying twins or multiples:  You may need to eat 300-600 extra calories a day.  The recommended range for total weight gain is 25-54 lb (11-25 kg), depending on your BMI before pregnancy.  Talk with your health care provider to find out about nutritional needs, weight gain, and exercise that is right for you. What foods can I eat?  Grains All grains. Choose whole grains, such as whole-wheat bread, oatmeal, or brown rice. Vegetables All vegetables. Eat a variety of colors and types of vegetables. Remember to wash your vegetables well before peeling or eating. Fruits All fruits. Eat a variety of colors and types of fruit. Remember to wash your fruits well before peeling or eating. Meats and other protein foods Lean meats, including chicken, [place], fish, and lean cuts of beef, veal, or pork. If you eat fish or seafood, choose options that are higher in omega-3 fatty acids and lower in mercury, such as salmon, herring, mussels, trout, sardines, pollock, shrimp, crab, and lobster. Tofu. Tempeh. Beans. Eggs. Peanut butter and other nut butters. Make sure that all meats, poultry, and eggs are cooked to food-safe temperatures or  "well-done." Two or more servings of fish are recommended each week in order to get the most benefits from omega-3 fatty acids that are found in seafood. Choose fish that are lower in mercury. You can find more information online:  GuamGaming.ch Dairy Pasteurized milk and milk alternatives (such as almond milk). Pasteurized yogurt and pasteurized cheese. Cottage cheese. Sour cream. Beverages Water. Juices that contain 100% fruit juice or vegetable juice. Caffeine-free teas and decaffeinated coffee. Drinks that contain caffeine are okay to drink, but it is better to avoid caffeine. Keep your total caffeine intake to less than 200 mg each day (which is 12 oz or 355 mL of coffee, tea, or soda) or the limit as told by your health care provider. Fats and oils Fats and oils are okay to include in moderation. Sweets and desserts Sweets and desserts are okay to include in moderation. Seasoning and other foods All pasteurized condiments. The items listed above may not be a complete list of recommended foods and beverages. Contact your dietitian for more options. The  items listed above may not be a complete list of foods and beverages [you/your child] can eat. Contact a dietitian for more information. What foods are not recommended? Vegetables Raw (unpasteurized) vegetable juices. Fruits Unpasteurized fruit juices. Meats and other protein foods Lunch meats, bologna, hot dogs, or other deli meats. (If you must eat those meats, reheat them until they are steaming hot.) Refrigerated pat, meat spreads from a meat counter, smoked seafood that is found in the refrigerated section of a store. Raw or undercooked meats, poultry, and eggs. Raw fish, such as sushi or sashimi. Fish that have high mercury content, such as tilefish, shark, swordfish, and king mackerel. To learn more about mercury in fish, talk with your health care provider or look for online resources, such as:  PumpkinSearch.com.ee Dairy Raw  (unpasteurized) milk and any foods that have raw milk in them. Soft cheeses, such as feta, queso blanco, queso fresco, Brie, Camembert cheeses, blue-veined cheeses, and Panela cheese (unless it is made with pasteurized milk, which must be stated on the label). Beverages Alcohol. Sugar-sweetened beverages, such as sodas, teas, or energy drinks. Seasoning and other foods Homemade fermented foods and drinks, such as pickles, sauerkraut, or kombucha drinks. (Store-bought pasteurized versions of these are okay.) Salads that are made in a store or deli, such as ham salad, chicken salad, egg salad, tuna salad, and seafood salad. The items listed above may not be a complete list of foods and beverages to avoid. Contact your dietitian for more information. The items listed above may not be a complete list of foods and beverages [you/your child] should avoid. Contact a dietitian for more information. Where to find more information To calculate the number of calories you need based on your height, weight, and activity level, you can use an online calculator such as:  PackageNews.is To calculate how much weight you should gain during pregnancy, you can use an online pregnancy weight gain calculator such as:  http://jones-berg.com/ Summary  While you are pregnant, your body requires additional nutrition to help support your growing baby.  Eat a variety of foods, especially fruits and vegetables to get a full range of vitamins and minerals.  Practice good food safety and cleanliness. Wash your hands before you eat and after you prepare raw meat. Wash all fruits and vegetables well before peeling or eating. Taking these actions can help to prevent food-borne illnesses, such as listeriosis, that can be very dangerous to your baby.  Do not eat raw meat or fish. Do not eat fish that have high mercury content, such as tilefish, shark, swordfish, and king  mackerel. Do not eat unpasteurized (raw) dairy.  Take a prenatal vitamin to help meet your additional vitamin and mineral needs during pregnancy, specifically for folic acid, iron, calcium, and vitamin D. This information is not intended to replace advice given to you by your health care provider. Make sure you discuss any questions you have with your health care provider. Document Released: 02/08/2014 Document Revised: 08/17/2018 Document Reviewed: 01/21/2017 Elsevier Patient Education  2020 [employer].   Follow up with your [facility] Provider as scheduled on 01/26/19

## 2019-01-13 NOTE — MAU Provider Note (Signed)
History     CSN: [id number]  Arrival date and time: 01/13/19 1518   None     Chief Complaint  Patient presents with  . Abdominal Pain   Alexandra Collins is a 23 y.o. at [redacted]w[redacted]d who is presenting for sudden onset epigastric pain that occurred around 1430. At that time she was watching TV with her husband and the pain came on suddenly. She describes it as sharp and stabby, and it's located was epigastric without radiation. The last thing she had to eat was at noon. She denies heartburn, diarrhea, or constipation, but does endorse some mild nausea that accompanied the pain. At the time of occurrence, she felt somewhat short of breath secondary to pain (denies wheezing, coughing, or difficulty with breathing), and needed her husband's help to get up and dressed. After a few minutes, the pain died down, and has faded to about a 2/10. She says it feels like it gets worse in waves and if she pushes in the spot where it occurred. She endorses a high fat diet, as she has been craving french fries during this pregnancy.  She receives her [facility].    OB History    Gravida  1   Para      Term      Preterm      AB      Living        SAB      TAB      Ectopic      Multiple      Live Births              Past Medical History:  Diagnosis Date  . Hydradenitis     Past Surgical History:  Procedure Laterality Date  . KNEE ARTHROSCOPY Right     No family history on file.  Social History   Tobacco Use  . Smoking status: Never Smoker  . Smokeless tobacco: Never Used  Substance Use Topics  . Alcohol use: No  . Drug use: No    Allergies:  Allergies  Allergen Reactions  . Ceclor [Cefaclor] Hives  . Garlic Hives  . Penicillins Hives    Has patient had a PCN reaction causing immediate rash, facial/tongue/throat swelling, SOB or lightheadedness with hypotension: Yes Has patient had a PCN reaction causing severe rash involving mucus membranes or skin  necrosis: Yes Has patient had a PCN reaction that required hospitalization: No Has patient had a PCN reaction occurring within the last 10 years: No If all of the above answers are "NO", then may proceed with Cephalosporin use.   . Shellfish Allergy Itching  . Sulfamethoxazole Nausea And Vomiting  . Sulfur Hives  . Amoxicillin-Pot Clavulanate Rash    Medications Prior to Admission  Medication Sig Dispense Refill Last Dose  . aspirin-sod bicarb-citric acid (ALKA-SELTZER) 325 MG TBEF tablet Take 325 mg by mouth every 6 (six) hours as needed (cold symptoms).     Marland Kitchen azithromycin (ZITHROMAX) 250 MG tablet Take 1 tablet (250 mg total) by mouth daily. Take first 2 tablets together, then 1 every day until finished. 6 tablet 0   . benzonatate (TESSALON) 100 MG capsule Take 1 capsule (100 mg total) by mouth every 8 (eight) hours. 21 capsule 0   . doxycycline (VIBRAMYCIN) 100 MG capsule Take 100 mg by mouth 2 (two) times daily.     Marland Kitchen etonogestrel (NEXPLANON) 68 MG IMPL implant Inject 68 mg into the muscle once.     Marland Kitchen  ferrous sulfate 325 (65 FE) MG tablet Take 325 mg by mouth daily with breakfast.     . fluticasone (FLONASE) 50 MCG/ACT nasal spray Place 2 sprays into both nostrils daily. 1 g 0   . ipratropium (ATROVENT) 0.06 % nasal spray Place 2 sprays into both nostrils 4 (four) times daily. 15 mL 0   . Multiple Vitamin (MULTIVITAMIN) tablet Take 1 tablet by mouth daily.     . norgestimate-ethinyl estradiol (ORTHO-CYCLEN,SPRINTEC,PREVIFEM) 0.25-35 MG-MCG tablet Take by mouth.       Review of Systems  Constitutional: Negative.   HENT: Negative.   Eyes: Negative.   Respiratory: Positive for shortness of breath (only secondary to pain). Negative for cough, choking, chest tightness and wheezing.   Cardiovascular: Negative.   Gastrointestinal: Positive for abdominal pain (right upper quadrant/epigastric) and nausea (mild, only with the episode of pain). Negative for abdominal distention, constipation,  diarrhea and vomiting.  Endocrine: Negative.   Genitourinary: Negative.   Musculoskeletal: Negative.   Skin: Negative.   Allergic/Immunologic: Negative.   Neurological: Negative.   Hematological: Negative.   Psychiatric/Behavioral: Negative.    Physical Exam   Blood pressure 130/67, pulse 95, temperature 98.9 F (37.2 C), temperature source Oral, resp. rate 20, height 5\' 6"  (1.676 m), weight (!) 137.7 kg, last menstrual period 07/18/2018, SpO2 100 %.  Physical Exam  Nursing note and vitals reviewed. Constitutional: She is oriented to person, place, and time. She appears well-developed and well-nourished.  HENT:  Head: Normocephalic and atraumatic.  Eyes: Pupils are equal, round, and reactive to light. EOM are normal.  Neck: Normal range of motion. Neck supple.  Cardiovascular: Normal rate, regular rhythm and normal heart sounds. Exam reveals no gallop and no friction rub.  No murmur heard. Respiratory: Effort normal and breath sounds normal. No respiratory distress. She has no wheezes. She has no rales. She exhibits no tenderness.  GI: Soft. Bowel sounds are normal. She exhibits no distension and no mass. There is abdominal tenderness (right upper quadrant/epigastric). There is no rebound and no guarding.  Obese  Genitourinary:    Genitourinary Comments: deferred   Musculoskeletal: Normal range of motion.  Neurological: She is alert and oriented to person, place, and time.  Skin: Skin is warm and dry.  Psychiatric: She has a normal mood and affect. Her behavior is normal. Thought content normal.   Results for orders placed or performed during the hospital encounter of 01/13/19 (from the past 24 hour(s))  Urinalysis, Routine w reflex microscopic     Status: None   Collection Time: 01/13/19  4:25 PM  Result Value Ref Range   Color, Urine YELLOW YELLOW   APPearance CLEAR CLEAR   Specific Gravity, Urine 1.023 1.005 - 1.030   pH 7.0 5.0 - 8.0   Glucose, UA NEGATIVE NEGATIVE mg/dL    Hgb urine dipstick NEGATIVE NEGATIVE   Bilirubin Urine NEGATIVE NEGATIVE   Ketones, ur NEGATIVE NEGATIVE mg/dL   Protein, ur NEGATIVE NEGATIVE mg/dL   Nitrite NEGATIVE NEGATIVE   Leukocytes,Ua NEGATIVE NEGATIVE  Comprehensive metabolic panel     Status: Abnormal   Collection Time: 01/13/19  5:06 PM  Result Value Ref Range   Sodium 135 135 - 145 mmol/L   Potassium 4.0 3.5 - 5.1 mmol/L   Chloride 103 98 - 111 mmol/L   CO2 24 22 - 32 mmol/L   Glucose, Bld 91 70 - 99 mg/dL   BUN 6 6 - 20 mg/dL   Creatinine, Ser 4.090.60 0.44 - 1.00 mg/dL  Calcium 9.1 8.9 - 10.3 mg/dL   Total Protein 7.0 6.5 - 8.1 g/dL   Albumin 3.3 (L) 3.5 - 5.0 g/dL   AST 18 15 - 41 U/L   ALT 13 0 - 44 U/L   Alkaline Phosphatase 55 38 - 126 U/L   Total Bilirubin 0.3 0.3 - 1.2 mg/dL   GFR calc non Af Amer >60 >60 mL/min   GFR calc Af Amer >60 >60 mL/min   Anion gap 8 5 - 15  CBC     Status: Abnormal   Collection Time: 01/13/19  5:06 PM  Result Value Ref Range   WBC 5.8 4.0 - 10.5 K/uL   RBC 4.04 3.87 - 5.11 MIL/uL   Hemoglobin 12.2 12.0 - 15.0 g/dL   HCT 68.0 (L) 88.1 - 10.3 %   MCV 88.1 80.0 - 100.0 fL   MCH 30.2 26.0 - 34.0 pg   MCHC 34.3 30.0 - 36.0 g/dL   RDW 15.9 45.8 - 59.2 %   Platelets 331 150 - 400 K/uL   nRBC 0.0 0.0 - 0.2 %   US Abdomen Limited Ruq  Result Date: 01/13/2019 CLINICAL DATA:  Epigastric pain, patient is [redacted] weeks pregnant EXAM: ULTRASOUND ABDOMEN LIMITED RIGHT UPPER QUADRANT COMPARISON:  None. FINDINGS: Gallbladder: No gallstones or wall thickening visualized. No sonographic Murphy sign noted by sonographer. Common bile duct: Diameter: 4 mm Liver: No focal lesion identified. Within normal limits in parenchymal echogenicity. Portal vein is patent on color Doppler imaging with normal direction of blood flow towards the liver. Other: None. IMPRESSION: No sonographic finding to explain the patient's epigastric pain. Electronically Signed   By: Emmaline Kluver M.D.   On: 01/13/2019 19:11     MAU Course  Procedures    MDM Pt informed that the ultrasound is considered a limited OB ultrasound and is not intended to be a complete ultrasound exam.  Patient also informed that the ultrasound is not being completed with the intent of assessing for fetal or placental anomalies or any pelvic abnormalities.  Explained that the purpose of today's ultrasound is to assess for  viability as fetal heart tones could not be heard with doppler.  Patient acknowledges the purpose of the exam and the limitations of the study.  Cardiac activity was seen on bedside ultrasound.  R/o cholecystitis (DDX includes gas pain, indigestion, acid reflux, biliary colic) CMP, CBC, RUQ [place]  Reassessment: Abdominal pain has completely resolved. She is resting comfortably with husband at bedside. Assessment and Plan  23 yo G1P0 at [redacted]w[redacted]d EGA with RUQ abdominal pain. - [place] negative for structural problems - CMP, CBC, UA wnl - Likely related to high-fat diet, educated on healthier dietary options for pregnancy - Follow up with OB [facility] on 01/26/19 as scheduled - Return precautions given  Aayra Hornbaker L Jax Abdelrahman 01/13/2019, 4:13 PM

## 2019-01-13 NOTE — ED Notes (Signed)
Transport called and will take pt to MAU

## 2019-01-22 ENCOUNTER — Other Ambulatory Visit: Payer: Self-pay

## 2019-01-22 ENCOUNTER — Encounter (HOSPITAL_COMMUNITY): Payer: Self-pay | Admitting: *Deleted

## 2019-01-22 ENCOUNTER — Inpatient Hospital Stay (HOSPITAL_COMMUNITY): Payer: Self-pay

## 2019-01-22 ENCOUNTER — Inpatient Hospital Stay (HOSPITAL_COMMUNITY)
Admission: AD | Admit: 2019-01-22 | Discharge: 2019-01-23 | Disposition: A | Discharge status: Home / Self Care | Payer: BC Managed Care – PPO | Attending: Obstetrics and Gynecology | Admitting: Obstetrics and Gynecology

## 2019-01-22 DIAGNOSIS — R519 Headache, unspecified: Secondary | ICD-10-CM

## 2019-01-22 DIAGNOSIS — Z3A13 13 weeks gestation of pregnancy: Secondary | ICD-10-CM | POA: Diagnosis not present

## 2019-01-22 DIAGNOSIS — O26891 Other specified pregnancy related conditions, first trimester: Secondary | ICD-10-CM | POA: Diagnosis not present

## 2019-01-22 DIAGNOSIS — R51 Headache: Secondary | ICD-10-CM | POA: Diagnosis not present

## 2019-01-22 DIAGNOSIS — R102 Pelvic and perineal pain: Secondary | ICD-10-CM | POA: Insufficient documentation

## 2019-01-22 DIAGNOSIS — Z880 Allergy status to penicillin: Secondary | ICD-10-CM | POA: Insufficient documentation

## 2019-01-22 DIAGNOSIS — O26899 Other specified pregnancy related conditions, unspecified trimester: Secondary | ICD-10-CM

## 2019-01-22 DIAGNOSIS — R0602 Shortness of breath: Secondary | ICD-10-CM | POA: Insufficient documentation

## 2019-01-22 LAB — URINALYSIS, ROUTINE W REFLEX MICROSCOPIC
Bilirubin Urine: NEGATIVE
Glucose, UA: NEGATIVE mg/dL
Hgb urine dipstick: NEGATIVE
Ketones, ur: NEGATIVE mg/dL
Leukocytes,Ua: NEGATIVE
Nitrite: NEGATIVE
Protein, ur: NEGATIVE mg/dL
Specific Gravity, Urine: 1.024 (ref 1.005–1.030)
pH: 6 (ref 5.0–8.0)

## 2019-01-22 LAB — CBC
HCT: 35.6 % — ABNORMAL LOW (ref 36.0–46.0)
Hemoglobin: 12.4 g/dL (ref 12.0–15.0)
MCH: 30.8 pg (ref 26.0–34.0)
MCHC: 34.8 g/dL (ref 30.0–36.0)
MCV: 88.6 fL (ref 80.0–100.0)
Platelets: 341 10*3/uL (ref 150–400)
RBC: 4.02 MIL/uL (ref 3.87–5.11)
RDW: 12.6 % (ref 11.5–15.5)
WBC: 6.1 10*3/uL (ref 4.0–10.5)
nRBC: 0 % (ref 0.0–0.2)

## 2019-01-22 LAB — WET PREP, GENITAL
Clue Cells Wet Prep HPF POC: NONE SEEN
Sperm: NONE SEEN
Trich, Wet Prep: NONE SEEN
Yeast Wet Prep HPF POC: NONE SEEN

## 2019-01-22 LAB — COMPREHENSIVE METABOLIC PANEL
ALT: 13 U/L (ref 0–44)
AST: 18 U/L (ref 15–41)
Albumin: 3.4 g/dL — ABNORMAL LOW (ref 3.5–5.0)
Alkaline Phosphatase: 56 U/L (ref 38–126)
Anion gap: 8 (ref 5–15)
BUN: 6 mg/dL (ref 6–20)
CO2: 22 mmol/L (ref 22–32)
Calcium: 9 mg/dL (ref 8.9–10.3)
Chloride: 105 mmol/L (ref 98–111)
Creatinine, Ser: 0.66 mg/dL (ref 0.44–1.00)
GFR calc Af Amer: >60 mL/min (ref >60)
GFR calc non Af Amer: >60 mL/min (ref >60)
Glucose, Bld: 124 mg/dL — ABNORMAL HIGH (ref 70–99)
Potassium: 3.8 mmol/L (ref 3.5–5.1)
Sodium: 135 mmol/L (ref 135–145)
Total Bilirubin: 0.2 mg/dL — ABNORMAL LOW (ref 0.3–1.2)
Total Protein: 6.9 g/dL (ref 6.5–8.1)

## 2019-01-22 LAB — HCG, QUANTITATIVE, PREGNANCY: hCG, Beta Chain, Quant, S: 115745 m[IU]/mL — ABNORMAL HIGH (ref <5)

## 2019-01-22 LAB — ABO/RH: ABO/RH(D): O POS

## 2019-01-22 MED ORDER — BUTALBITAL-APAP-CAFFEINE 50-325-40 MG PO TABS
2.0000 | ORAL_TABLET | Freq: Four times a day (QID) | ORAL | Status: DC | PRN
  Administered 2019-01-22: 2 via ORAL
  Filled 2019-01-22: qty 2

## 2019-01-22 MED ORDER — CYCLOBENZAPRINE HCL 10 MG PO TABS: 10.0000 mg | ORAL_TABLET | Freq: Once | ORAL | Status: DC

## 2019-01-22 NOTE — MAU Provider Note (Signed)
History     CSN: [id number]  Arrival date and time: 01/22/19 1534   First Provider Initiated Contact with Patient 01/22/19 2310      Chief Complaint  Patient presents with  . Abdominal Pain  . Shortness of Breath    Alexandra Collins is a 23 y.o. G1P0 at [redacted]w[redacted]d who receives care at [facility].  She presents today for Abdominal Pain and Shortness of Breath.  She states the abdominal pain and cramping started about 12pm today and has been a intermittent sharp pain. She states the pain is less than 30 seconds and is relieved with position change, while the cramping lasts about 2-3 minutes.  She rates the pain at about a 6-7/10, but it was initially a 2-3/10.  Patient states the SOB is in conjunction with the pain. Patient reports that she started having back pain at 8pm with the onset of chills shortly after.  Patient reports some vaginal discharge that is clear to white, but denies odor, itching, or burning.  Patient reports sexual activity yesterday. Patient also reports a headache that has been present all day despite tylenol dosing.  Patient goes on to state that she has been experiencing headaches daily since discovering pregnancy.  Patient questions normalcy of this.   Of note, patient with labs and [place] orders placed prior to provider assessment.      OB History    Gravida  1   Para      Term      Preterm      AB      Living        SAB      TAB      Ectopic      Multiple      Live Births              Past Medical History:  Diagnosis Date  . Hydradenitis     Past Surgical History:  Procedure Laterality Date  . KNEE ARTHROSCOPY Right     Family History  Problem Relation Age of Onset  . Hypertension Mother     Social History   Tobacco Use  . Smoking status: Never Smoker  . Smokeless tobacco: Never Used  Substance Use Topics  . Alcohol use: No  . Drug use: No    Allergies:  Allergies  Allergen Reactions  . Ceclor [Cefaclor]  Hives  . Garlic Hives  . Penicillins Hives    Has patient had a PCN reaction causing immediate rash, facial/tongue/throat swelling, SOB or lightheadedness with hypotension: Yes Has patient had a PCN reaction causing severe rash involving mucus membranes or skin necrosis: Yes Has patient had a PCN reaction that required hospitalization: No Has patient had a PCN reaction occurring within the last 10 years: No If all of the above answers are "NO", then may proceed with Cephalosporin use.   . Shellfish Allergy Itching  . Sulfamethoxazole Nausea And Vomiting  . Sulfur Hives  . Amoxicillin-Pot Clavulanate Rash    Medications Prior to Admission  Medication Sig Dispense Refill Last Dose  . Prenatal Vit-Fe Fumarate-FA (MULTIVITAMIN-PRENATAL) 27-0.8 MG TABS tablet Take 1 tablet by mouth daily.   01/21/2019 at Unknown time    Review of Systems  Constitutional: Positive for chills. Negative for fever.  Respiratory: Positive for shortness of breath. Negative for cough.   Gastrointestinal: Positive for abdominal pain, constipation and nausea. Negative for diarrhea and vomiting.  Genitourinary: Positive for vaginal discharge. Negative for difficulty urinating, dysuria and  vaginal bleeding.  Neurological: Positive for light-headedness and headaches (6/10). Negative for dizziness.   Physical Exam   Blood pressure (!) 127/54, pulse 86, temperature 97.9 F (36.6 C), temperature source Oral, resp. rate 20, height 5\' 6"  (1.676 m), weight (!) 139.7 kg, last menstrual period 07/18/2018, SpO2 100 %.  Physical Exam  Constitutional: She is oriented to person, place, and time. She appears well-developed and well-nourished. No distress.  HENT:  Head: Normocephalic and atraumatic.  Eyes: Conjunctivae are normal.  Neck: Normal range of motion.  Cardiovascular: Normal rate, regular rhythm and normal heart sounds.  Respiratory: Effort normal and breath sounds normal.  GI: Soft. Bowel sounds are normal. There  is abdominal tenderness in the right lower quadrant and left lower quadrant. There is no rebound, no guarding, no CVA tenderness and no tenderness at McBurney's point.  Musculoskeletal: Normal range of motion.  Neurological: She is alert and oriented to person, place, and time.  Skin: Skin is warm and dry.  Psychiatric: She has a normal mood and affect. Her behavior is normal.    MAU Course  Procedures Results for orders placed or performed during the hospital encounter of 01/22/19 (from the past 24 hour(s))  Urinalysis, Routine w reflex microscopic     Status: Abnormal   Collection Time: 01/22/19  4:00 PM  Result Value Ref Range   Color, Urine YELLOW YELLOW   APPearance HAZY (A) CLEAR   Specific Gravity, Urine 1.024 1.005 - 1.030   pH 6.0 5.0 - 8.0   Glucose, UA NEGATIVE NEGATIVE mg/dL   Hgb urine dipstick NEGATIVE NEGATIVE   Bilirubin Urine NEGATIVE NEGATIVE   Ketones, ur NEGATIVE NEGATIVE mg/dL   Protein, ur NEGATIVE NEGATIVE mg/dL   Nitrite NEGATIVE NEGATIVE   Leukocytes,Ua NEGATIVE NEGATIVE  ABO/Rh     Status: None   Collection Time: 01/22/19  6:55 PM  Result Value Ref Range   ABO/RH(D) O POS    No rh immune globuloin      NOT A RH IMMUNE GLOBULIN CANDIDATE, PT RH POSITIVE Performed at [facility], [address] [place], [place] [address]   CBC     Status: Abnormal   Collection Time: 01/22/19  7:10 PM  Result Value Ref Range   WBC 6.1 4.0 - 10.5 K/uL   RBC 4.02 3.87 - 5.11 MIL/uL   Hemoglobin 12.4 12.0 - 15.0 g/dL   HCT 60.435.6 (L) 54.036.0 - 98.146.0 %   MCV 88.6 80.0 - 100.0 fL   MCH 30.8 26.0 - 34.0 pg   MCHC 34.8 30.0 - 36.0 g/dL   RDW 19.112.6 47.811.5 - 29.515.5 %   Platelets 341 150 - 400 K/uL   nRBC 0.0 0.0 - 0.2 %  Comprehensive metabolic panel     Status: Abnormal   Collection Time: 01/22/19  7:10 PM  Result Value Ref Range   Sodium 135 135 - 145 mmol/L   Potassium 3.8 3.5 - 5.1 mmol/L   Chloride 105 98 - 111 mmol/L   CO2 22 22 - 32 mmol/L   Glucose, Bld 124 (H)  70 - 99 mg/dL   BUN 6 6 - 20 mg/dL   Creatinine, Ser 6.210.66 0.44 - 1.00 mg/dL   Calcium 9.0 8.9 - 30.810.3 mg/dL   Total Protein 6.9 6.5 - 8.1 g/dL   Albumin 3.4 (L) 3.5 - 5.0 g/dL   AST 18 15 - 41 U/L   ALT 13 0 - 44 U/L   Alkaline Phosphatase 56 38 - 126 U/L  Total Bilirubin 0.2 (L) 0.3 - 1.2 mg/dL   GFR calc non Af Amer >60 >60 mL/min   GFR calc Af Amer >60 >60 mL/min   Anion gap 8 5 - 15  hCG, quantitative, pregnancy     Status: Abnormal   Collection Time: 01/22/19  7:10 PM  Result Value Ref Range   hCG, Beta Chain, Quant, S 115,745 (H) <5 mIU/mL  Wet prep, genital     Status: Abnormal   Collection Time: 01/22/19 11:22 PM   Specimen: Vaginal  Result Value Ref Range   Yeast Wet Prep HPF POC NONE SEEN NONE SEEN   Trich, Wet Prep NONE SEEN NONE SEEN   Clue Cells Wet Prep HPF POC NONE SEEN NONE SEEN   WBC, Wet Prep HPF POC FEW (A) NONE SEEN   Sperm NONE SEEN    US Ob Comp Less 14 Wks  Result Date: 01/22/2019 CLINICAL DATA:  Pain EXAM: OBSTETRIC <14 WK ULTRASOUND TECHNIQUE: Transabdominal ultrasound was performed for evaluation of the gestation as well as the maternal uterus and adnexal regions. COMPARISON:  None. FINDINGS: LMP: 07/18/2018 GA by LMP: 26 w  6 d EDC by LMP: 04/24/2019 GA by first ultrasound: 13 weeks 1 days EDC by first ultrasound: 07/29/2019 Intrauterine gestational sac: Single Yolk sac:  Not Visualized. Embryo:  Visualized. Cardiac Activity: Visualized. Heart Rate: 150 bpm CRL:   55.8 mm   12 w 1 d                  [place] EDC: 08/05/2019 Subchorionic hemorrhage:  Trace subchorionic hemorrhage is noted. Maternal uterus/adnexae: Maternal structures are unremarkable. No free fluid. IMPRESSION: Single viable intrauterine gestation within estimated gestational age [redacted] weeks 1 day, similar to prior outside sonographic dating. Small volume subchorionic hemorrhage. Consider obstetrical consultation. Nonvisualization of the yolk sac may be a normal finding following [redacted] weeks gestation.  Electronically Signed   By: Kreg Shropshire M.D.   On: 01/22/2019 21:38    MDM Pelvic Exam; Wet Prep and GC/CT Labs: UA, UPT, CBC, hCG, ABO Ultrasound Pain Medication Assessment and Plan  23 year old G1 SIUP at 13.2 weeks Round Ligament Pain Headache  -Exam, lab, and US findings discussed. -Patient educated on normalcy of round ligament pain in pregnancy. -Discussed increase of headaches in pregnancy, but instructed to follow up with primary care ob for further evaluation and referral to neurology if necessary.  -Informed that provider will treat headache today and send medication if effective. -Patient instructed to collect cultures via self swab.  -Will also give food and fluids. -Will observe and reassess.   Cherre Robins  MSN, CNM 01/22/2019, 11:10 PM   Reassessment (12:13 AM)  -Wet prep returns with insignificant findings. -Results discussed with patient. -Informed that GC/CT will return within 2-3 days. -Patient reports pain now 0/10. -Rx for Fiorcet sent to pharmacy on file.  -Instructed to follow up with primary ob regarding recurring HA. -Encouraged to call or return to MAU if symptoms worsen or with the onset of new symptoms. -Discharged to home in improved condition.  Cherre Robins MSN, CNM

## 2019-01-22 NOTE — MAU Note (Signed)
Having discomfort/pain in mid abd, esp on rt side and behind belly button, cramping in lower abd.  Comes in waves, sharp pain.   SOB, this and pain started at 1200.  Nausea started at 1400. Was here before, found out gallbladder was enlarged. Was told if pain came back to come back.  Pain feels the same but now in a different spot.

## 2019-01-23 MED ORDER — BUTALBITAL-APAP-CAFFEINE 50-325-40 MG PO TABS: 2.0000 | ORAL_TABLET | Freq: Four times a day (QID) | ORAL | 0 refills | Status: DC | PRN

## 2019-01-23 NOTE — Discharge Instructions (Signed)
Round Ligament Pain  The round ligament is a cord of muscle and tissue that helps support the uterus. It can become a source of pain during pregnancy if it becomes stretched or twisted as the baby grows. The pain usually begins in the second trimester (13-28 weeks) of pregnancy, and it can come and go until the baby is delivered. It is not a serious problem, and it does not cause harm to the baby. Round ligament pain is usually a short, sharp, and pinching pain, but it can also be a dull, lingering, and aching pain. The pain is felt in the lower side of the abdomen or in the groin. It usually starts deep in the groin and moves up to the outside of the hip area. The pain may occur when you:  Suddenly change position, such as quickly going from a sitting to standing position.  Roll over in bed.  Cough or sneeze.  Do physical activity. Follow these instructions at home:   Watch your condition for any changes.  When the pain starts, relax. Then try any of these methods to help with the pain: ? Sitting down. ? Flexing your knees up to your abdomen. ? Lying on your side with one pillow under your abdomen and another pillow between your legs. ? Sitting in a warm bath for 15-20 minutes or until the pain goes away.  Take over-the-counter and prescription medicines only as told by your health care provider.  Move slowly when you sit down or stand up.  Avoid long walks if they cause pain.  Stop or reduce your physical activities if they cause pain.  Keep all follow-up visits as told by your health care provider. This is important. Contact a health care provider if:  Your pain does not go away with treatment.  You feel pain in your back that you did not have before.  Your medicine is not helping. Get help right away if:  You have a fever or chills.  You develop uterine contractions.  You have vaginal bleeding.  You have nausea or vomiting.  You have diarrhea.  You have pain  when you urinate. Summary  Round ligament pain is felt in the lower abdomen or groin. It is usually a short, sharp, and pinching pain. It can also be a dull, lingering, and aching pain.  This pain usually begins in the second trimester (13-28 weeks). It occurs because the uterus is stretching with the growing baby, and it is not harmful to the baby.  You may notice the pain when you suddenly change position, when you cough or sneeze, or during physical activity.  Relaxing, flexing your knees to your abdomen, lying on one side, or taking a warm bath may help to get rid of the pain.  Get help from your health care provider if the pain does not go away or if you have vaginal bleeding, nausea, vomiting, diarrhea, or painful urination. This information is not intended to replace advice given to you by your health care provider. Make sure you discuss any questions you have with your health care provider. Document Released: 02/03/2008 Document Revised: 10/12/2017 Document Reviewed: 10/12/2017 Elsevier Patient Education  2020 [employer].   Second Trimester of Pregnancy The second trimester is from week 14 through week 27 (months 4 through 6). The second trimester is often a time when you feel your best. Your body has adjusted to being pregnant, and you begin to feel better physically. Usually, morning sickness has lessened or quit  completely, you may have more energy, and you may have an increase in appetite. The second trimester is also a time when the fetus is growing rapidly. At the end of the sixth month, the fetus is about 9 inches long and weighs about 1 pounds. You will likely begin to feel the baby move (quickening) between 16 and 20 weeks of pregnancy. Body changes during your second trimester Your body continues to go through many changes during your second trimester. The changes vary from woman to woman.  Your weight will continue to increase. You will notice your lower abdomen bulging  out.  You may begin to get stretch marks on your hips, abdomen, and breasts.  You may develop headaches that can be relieved by medicines. The medicines should be approved by your health care provider.  You may urinate more often because the fetus is pressing on your bladder.  You may develop or continue to have heartburn as a result of your pregnancy.  You may develop constipation because certain hormones are causing the muscles that push waste through your intestines to slow down.  You may develop hemorrhoids or swollen, bulging veins (varicose veins).  You may have back pain. This is caused by: ? Weight gain. ? Pregnancy hormones that are relaxing the joints in your pelvis. ? A shift in weight and the muscles that support your balance.  Your breasts will continue to grow and they will continue to become tender.  Your gums may bleed and may be sensitive to brushing and flossing.  Dark spots or blotches (chloasma, mask of pregnancy) may develop on your face. This will likely fade after the baby is born.  A dark line from your belly button to the pubic area (linea nigra) may appear. This will likely fade after the baby is born.  You may have changes in your hair. These can include thickening of your hair, rapid growth, and changes in texture. Some women also have hair loss during or after pregnancy, or hair that feels dry or thin. Your hair will most likely return to normal after your baby is born. What to expect at prenatal visits During a routine prenatal visit:  You will be weighed to make sure you and the fetus are growing normally.  Your blood pressure will be taken.  Your abdomen will be measured to track your baby's growth.  The fetal heartbeat will be listened to.  Any test results from the previous visit will be discussed. Your health care provider may ask you:  How you are feeling.  If you are feeling the baby move.  If you have had any abnormal symptoms, such  as leaking fluid, bleeding, severe headaches, or abdominal cramping.  If you are using any tobacco products, including cigarettes, chewing tobacco, and electronic cigarettes.  If you have any questions. Other tests that may be performed during your second trimester include:  Blood tests that check for: ? Low iron levels (anemia). ? High blood sugar that affects pregnant women (gestational diabetes) between 16 and 28 weeks. ? Rh antibodies. This is to check for a protein on red blood cells (Rh factor).  Urine tests to check for infections, diabetes, or protein in the urine.  An ultrasound to confirm the proper growth and development of the baby.  An amniocentesis to check for possible genetic problems.  Fetal screens for spina bifida and Down syndrome.  HIV (human immunodeficiency virus) testing. Routine prenatal testing includes screening for HIV, unless you choose not to  have this test. Follow these instructions at home: Medicines  Follow your health care provider's instructions regarding medicine use. Specific medicines may be either safe or unsafe to take during pregnancy.  Take a prenatal vitamin that contains at least 600 micrograms (mcg) of folic acid.  If you develop constipation, try taking a stool softener if your health care provider approves. Eating and drinking   Eat a balanced diet that includes fresh fruits and vegetables, whole grains, good sources of protein such as meat, eggs, or tofu, and low-fat dairy. Your health care provider will help you determine the amount of weight gain that is right for you.  Avoid raw meat and uncooked cheese. These carry germs that can cause birth defects in the baby.  If you have low calcium intake from food, talk to your health care provider about whether you should take a daily calcium supplement.  Limit foods that are high in fat and processed sugars, such as fried and sweet foods.  To prevent constipation: ? Drink enough  fluid to keep your urine clear or pale yellow. ? Eat foods that are high in fiber, such as fresh fruits and vegetables, whole grains, and beans. Activity  Exercise only as directed by your health care provider. Most women can continue their usual exercise routine during pregnancy. Try to exercise for 30 minutes at least 5 days a week. Stop exercising if you experience uterine contractions.  Avoid heavy lifting, wear low heel shoes, and practice good posture.  A sexual relationship may be continued unless your health care provider directs you otherwise. Relieving pain and discomfort  Wear a good support bra to prevent discomfort from breast tenderness.  Take warm sitz baths to soothe any pain or discomfort caused by hemorrhoids. Use hemorrhoid cream if your health care provider approves.  Rest with your legs elevated if you have leg cramps or low back pain.  If you develop varicose veins, wear support hose. Elevate your feet for 15 minutes, 3-4 times a day. Limit salt in your diet. Prenatal Care  Write down your questions. Take them to your prenatal visits.  Keep all your prenatal visits as told by your health care provider. This is important. Safety  Wear your seat belt at all times when driving.  Make a list of emergency phone numbers, including numbers for family, friends, the hospital, and police and fire departments. General instructions  Ask your health care provider for a referral to a local prenatal education class. Begin classes no later than the beginning of month 6 of your pregnancy.  Ask for help if you have counseling or nutritional needs during pregnancy. Your health care provider can offer advice or refer you to specialists for help with various needs.  Do not use hot tubs, steam rooms, or saunas.  Do not douche or use tampons or scented sanitary pads.  Do not cross your legs for long periods of time.  Avoid cat litter boxes and soil used by cats. These carry  germs that can cause birth defects in the baby and possibly loss of the fetus by miscarriage or stillbirth.  Avoid all smoking, herbs, alcohol, and unprescribed drugs. Chemicals in these products can affect the formation and growth of the baby.  Do not use any products that contain nicotine or tobacco, such as cigarettes and e-cigarettes. If you need help quitting, ask your health care provider.  Visit your dentist if you have not gone yet during your pregnancy. Use a soft toothbrush to brush  your teeth and be gentle when you floss. Contact a health care provider if:  You have dizziness.  You have mild pelvic cramps, pelvic pressure, or nagging pain in the abdominal area.  You have persistent nausea, vomiting, or diarrhea.  You have a bad smelling vaginal discharge.  You have pain when you urinate. Get help right away if:  You have a fever.  You are leaking fluid from your vagina.  You have spotting or bleeding from your vagina.  You have severe abdominal cramping or pain.  You have rapid weight gain or weight loss.  You have shortness of breath with chest pain.  You notice sudden or extreme swelling of your face, hands, ankles, feet, or legs.  You have not felt your baby move in over an hour.  You have severe headaches that do not go away when you take medicine.  You have vision changes. Summary  The second trimester is from week 14 through week 27 (months 4 through 6). It is also a time when the fetus is growing rapidly.  Your body goes through many changes during pregnancy. The changes vary from woman to woman.  Avoid all smoking, herbs, alcohol, and unprescribed drugs. These chemicals affect the formation and growth your baby.  Do not use any tobacco products, such as cigarettes, chewing tobacco, and e-cigarettes. If you need help quitting, ask your health care provider.  Contact your health care provider if you have any questions. Keep all prenatal visits as told  by your health care provider. This is important. This information is not intended to replace advice given to you by your health care provider. Make sure you discuss any questions you have with your health care provider. Document Released: 04/20/2001 Document Revised: 08/18/2018 Document Reviewed: 06/01/2016 Elsevier Patient Education  2020 [employer]

## 2019-01-24 LAB — GC/CHLAMYDIA PROBE AMP (~~LOC~~) NOT AT ARMC
Chlamydia: NEGATIVE
Neisseria Gonorrhea: NEGATIVE

## 2019-01-26 LAB — CYSTIC FIBROSIS MUTATION 97
Cystic Fibrosis Profile: NEGATIVE
Glucose 1 Hour: 100

## 2019-02-18 ENCOUNTER — Other Ambulatory Visit: Payer: Self-pay

## 2019-02-18 ENCOUNTER — Emergency Department (HOSPITAL_COMMUNITY)
Admission: EM | Admit: 2019-02-18 | Discharge: 2019-02-18 | Disposition: A | Discharge status: Home / Self Care | Payer: BC Managed Care – PPO | Attending: Emergency Medicine | Admitting: Emergency Medicine

## 2019-02-18 ENCOUNTER — Encounter (HOSPITAL_COMMUNITY): Payer: Self-pay | Admitting: *Deleted

## 2019-02-18 DIAGNOSIS — Y999 Unspecified external cause status: Secondary | ICD-10-CM | POA: Diagnosis not present

## 2019-02-18 DIAGNOSIS — Y9389 Activity, other specified: Secondary | ICD-10-CM | POA: Diagnosis not present

## 2019-02-18 DIAGNOSIS — O99891 Other specified diseases and conditions complicating pregnancy: Secondary | ICD-10-CM | POA: Insufficient documentation

## 2019-02-18 DIAGNOSIS — Y92410 Unspecified street and highway as the place of occurrence of the external cause: Secondary | ICD-10-CM | POA: Diagnosis not present

## 2019-02-18 DIAGNOSIS — Z3A17 17 weeks gestation of pregnancy: Secondary | ICD-10-CM | POA: Diagnosis not present

## 2019-02-18 DIAGNOSIS — Z79899 Other long term (current) drug therapy: Secondary | ICD-10-CM | POA: Diagnosis not present

## 2019-02-18 DIAGNOSIS — R109 Unspecified abdominal pain: Secondary | ICD-10-CM | POA: Insufficient documentation

## 2019-02-18 NOTE — ED Notes (Signed)
Dr. Ronnald Nian at bedside. Ultrasound being preformed.
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
## 2019-02-18 NOTE — Discharge Instructions (Addendum)
Follow-up with your OB/GYN as discussed. 

## 2019-02-18 NOTE — ED Notes (Signed)
Discharge instructions discussed with pt. Pt verbalized understanding. Pt stable and ambulatory. No signature pad available. 

## 2019-03-17 ENCOUNTER — Other Ambulatory Visit: Payer: Self-pay

## 2019-03-17 ENCOUNTER — Inpatient Hospital Stay (HOSPITAL_COMMUNITY)
Admission: AD | Admit: 2019-03-17 | Discharge: 2019-03-18 | Disposition: A | Discharge status: Home / Self Care | Payer: BC Managed Care – PPO | Attending: Obstetrics and Gynecology | Admitting: Obstetrics and Gynecology

## 2019-03-17 DIAGNOSIS — Z880 Allergy status to penicillin: Secondary | ICD-10-CM | POA: Insufficient documentation

## 2019-03-17 DIAGNOSIS — Z3A21 21 weeks gestation of pregnancy: Secondary | ICD-10-CM

## 2019-03-17 DIAGNOSIS — N949 Unspecified condition associated with female genital organs and menstrual cycle: Secondary | ICD-10-CM

## 2019-03-17 DIAGNOSIS — Z882 Allergy status to sulfonamides status: Secondary | ICD-10-CM | POA: Insufficient documentation

## 2019-03-17 DIAGNOSIS — Z881 Allergy status to other antibiotic agents status: Secondary | ICD-10-CM | POA: Insufficient documentation

## 2019-03-17 DIAGNOSIS — R1032 Left lower quadrant pain: Secondary | ICD-10-CM | POA: Insufficient documentation

## 2019-03-17 DIAGNOSIS — O26892 Other specified pregnancy related conditions, second trimester: Secondary | ICD-10-CM | POA: Insufficient documentation

## 2019-03-17 HISTORY — DX: Anemia, unspecified: D64.9

## 2019-03-18 ENCOUNTER — Other Ambulatory Visit: Payer: Self-pay

## 2019-03-18 ENCOUNTER — Encounter (HOSPITAL_COMMUNITY): Payer: Self-pay | Admitting: *Deleted

## 2019-03-18 DIAGNOSIS — O26892 Other specified pregnancy related conditions, second trimester: Secondary | ICD-10-CM | POA: Diagnosis not present

## 2019-03-18 DIAGNOSIS — R1032 Left lower quadrant pain: Secondary | ICD-10-CM | POA: Diagnosis present

## 2019-03-18 DIAGNOSIS — Z3A21 21 weeks gestation of pregnancy: Secondary | ICD-10-CM | POA: Diagnosis not present

## 2019-03-18 DIAGNOSIS — N949 Unspecified condition associated with female genital organs and menstrual cycle: Secondary | ICD-10-CM | POA: Diagnosis not present

## 2019-03-18 DIAGNOSIS — Z881 Allergy status to other antibiotic agents status: Secondary | ICD-10-CM | POA: Diagnosis not present

## 2019-03-18 DIAGNOSIS — Z882 Allergy status to sulfonamides status: Secondary | ICD-10-CM | POA: Diagnosis not present

## 2019-03-18 DIAGNOSIS — Z880 Allergy status to penicillin: Secondary | ICD-10-CM | POA: Diagnosis not present

## 2019-03-18 LAB — URINALYSIS, ROUTINE W REFLEX MICROSCOPIC
Bilirubin Urine: NEGATIVE
Glucose, UA: NEGATIVE mg/dL
Hgb urine dipstick: NEGATIVE
Ketones, ur: NEGATIVE mg/dL
Leukocytes,Ua: NEGATIVE
Nitrite: NEGATIVE
Protein, ur: NEGATIVE mg/dL
Specific Gravity, Urine: 1.027 (ref 1.005–1.030)
pH: 6 (ref 5.0–8.0)

## 2019-03-18 MED ORDER — IBUPROFEN 600 MG PO TABS
600.0000 mg | ORAL_TABLET | Freq: Once | ORAL | Status: AC
  Administered 2019-03-18: 01:00:00 600 mg via ORAL
  Filled 2019-03-18: qty 1

## 2019-03-18 MED ORDER — CYCLOBENZAPRINE HCL 10 MG PO TABS: 10.0000 mg | ORAL_TABLET | Freq: Two times a day (BID) | ORAL | 1 refills | Status: DC | PRN

## 2019-03-18 MED ORDER — CYCLOBENZAPRINE HCL 10 MG PO TABS: 10.0000 mg | ORAL_TABLET | Freq: Two times a day (BID) | ORAL | 0 refills | Status: DC | PRN

## 2019-03-18 MED ORDER — CYCLOBENZAPRINE HCL 10 MG PO TABS
10.0000 mg | ORAL_TABLET | Freq: Once | ORAL | Status: AC
  Administered 2019-03-18: 01:00:00 10 mg via ORAL
  Filled 2019-03-18: qty 1

## 2019-03-18 NOTE — MAU Provider Note (Signed)
Patient Alexandra Collins is a 23 y.o. G1P0 At [redacted]w[redacted]d here with complaints of pain on her left lower quadrant that radiates to her groin. She denies VB, nausea/vomiting, dysuria, low back pain, vaginal discharge or other ob-gyn complaints. She gets her at [facility] in [facility], but she would like to deliver at [facility]   She denies problems with BM; denies constipation. She reports significant problems ambulating now;  History     CSN: [id number]  Arrival date and time: 03/17/19 2344   None     Chief Complaint  Patient presents with  . Groin Pain  . Abdominal Pain   Groin Pain The patient's pertinent negatives include no vaginal bleeding or vaginal discharge. This is a recurrent problem. The current episode started more than 1 month ago. The problem occurs constantly. Associated symptoms include abdominal pain. Pertinent negatives include no back pain, chills, constipation, diarrhea, sore throat, urgency or vomiting. Nothing aggravates the symptoms. She has tried nothing for the symptoms.  Abdominal Pain Pertinent negatives include no constipation, diarrhea or vomiting.    OB History    Gravida  1   Para      Term      Preterm      AB      Living        SAB      TAB      Ectopic      Multiple      Live Births              Past Medical History:  Diagnosis Date  . Anemia   . Hydradenitis     Past Surgical History:  Procedure Laterality Date  . KNEE ARTHROSCOPY Right     Family History  Problem Relation Age of Onset  . Hypertension Mother     Social History   Tobacco Use  . Smoking status: Never Smoker  . Smokeless tobacco: Never Used  Substance Use Topics  . Alcohol use: No  . Drug use: No    Allergies:  Allergies  Allergen Reactions  . Ceclor [Cefaclor] Hives  . Garlic Hives  . Penicillins Hives    Has patient had a PCN reaction causing immediate rash, facial/tongue/throat swelling, SOB or lightheadedness with hypotension: Yes Has patient  had a PCN reaction causing severe rash involving mucus membranes or skin necrosis: Yes Has patient had a PCN reaction that required hospitalization: No Has patient had a PCN reaction occurring within the last 10 years: No If all of the above answers are "NO", then may proceed with Cephalosporin use.   . Shellfish Allergy Itching  . Sulfamethoxazole Nausea And Vomiting  . Sulfur Hives  . Amoxicillin-Pot Clavulanate Rash    Medications Prior to Admission  Medication Sig Dispense Refill Last Dose  . Prenatal Vit-Fe Fumarate-FA (MULTIVITAMIN-PRENATAL) 27-0.8 MG TABS tablet Take 2 tablets by mouth daily.    03/18/2019 at Unknown time  . butalbital-acetaminophen-caffeine (FIORICET) 50-325-40 MG tablet Take 2 tablets by mouth every 6 (six) hours as needed for headache. (Patient not taking: Reported on 02/18/2019) 14 tablet 0 More than a month at Unknown time  . EPINEPHrine 0.3 mg/0.3 mL IJ SOAJ injection Inject 0.3 mg into the muscle as needed for anaphylaxis.   More than a month at Unknown time    Review of Systems  Constitutional: Negative for chills.  HENT: Negative for sore throat.   Gastrointestinal: Positive for abdominal pain. Negative for constipation, diarrhea and vomiting.  Genitourinary: Negative for urgency and vaginal  discharge.  Musculoskeletal: Negative for back pain.   Physical Exam   Blood pressure 109/63, pulse 98, temperature 98.8 F (37.1 C), temperature source Oral, resp. rate 18, height 5\' 6"  (1.676 m), weight (!) 141.8 kg, last menstrual period 07/18/2018, SpO2 98 %.  Physical Exam  Constitutional: She is oriented to person, place, and time. She appears well-developed and well-nourished.  HENT:  Head: Normocephalic.  Neck: Normal range of motion.  Respiratory: Effort normal.  GI: Soft.  Musculoskeletal: Normal range of motion.  Neurological: She is alert and oriented to person, place, and time.  Skin: Skin is warm and dry.  Psychiatric: She has a normal mood and  affect.  Abdomen is soft, non-tender.   MAU Course  Procedures  MDM -Patient appears to be having an exacerbation of round ligament pain. Patient ambulating in room with assistance but appears uncomfortable while walking.  -Low index of suspicion for surgical abdomen -FHR by Doppler is 147 -Patient had ibuprofen and flexeril, pain now improved.   Assessment and Plan   1. Round ligament pain    2. Patient desires to switch practices so that she can have prenatal care with [facility]; message sent to [facility] office to schedule.   3. Patient to be sent home with RX for flexeril, try heat and take ibuprofen up to 28 weeks. Written RX for maternity support belt given to patient as well as instructions on where to go to get it filled.   4. PT ambulatory referral sent to [facility] location.   [facility] Teresa Nicodemus 03/18/2019, 2:37 AM

## 2019-03-18 NOTE — ED Triage Notes (Signed)
Pt c/o pain below her left sided pannus as well as left groin and left, interior, upper thigh. States that this has been ongoing about 7-8 weeks. Pt is currently 20-[redacted] weeks pregnant.

## 2019-03-18 NOTE — Discharge Instructions (Signed)
-go to [email or web address] and buy a maternity support belt to help lift your uterus off of your hips. Get a larger size and have your husband help you put it on.  -try heat, ibuprofen up to 28 weeks and Flexeril.  -someone from PT will be calling you to arrange an appt-this will help you!    Round Ligament Pain  The round ligament is a cord of muscle and tissue that helps support the uterus. It can become a source of pain during pregnancy if it becomes stretched or twisted as the baby grows. The pain usually begins in the second trimester (13-28 weeks) of pregnancy, and it can come and go until the baby is delivered. It is not a serious problem, and it does not cause harm to the baby. Round ligament pain is usually a short, sharp, and pinching pain, but it can also be a dull, lingering, and aching pain. The pain is felt in the lower side of the abdomen or in the groin. It usually starts deep in the groin and moves up to the outside of the hip area. The pain may occur when you:  Suddenly change position, such as quickly going from a sitting to standing position.  Roll over in bed.  Cough or sneeze.  Do physical activity. Follow these instructions at home:   Watch your condition for any changes.  When the pain starts, relax. Then try any of these methods to help with the pain: ? Sitting down. ? Flexing your knees up to your abdomen. ? Lying on your side with one pillow under your abdomen and another pillow between your legs. ? Sitting in a warm bath for 15-20 minutes or until the pain goes away.  Take over-the-counter and prescription medicines only as told by your health care provider.  Move slowly when you sit down or stand up.  Avoid long walks if they cause pain.  Stop or reduce your physical activities if they cause pain.  Keep all follow-up visits as told by your health care provider. This is important. Contact a health care provider if:  Your pain does not go away with  treatment.  You feel pain in your back that you did not have before.  Your medicine is not helping. Get help right away if:  You have a fever or chills.  You develop uterine contractions.  You have vaginal bleeding.  You have nausea or vomiting.  You have diarrhea.  You have pain when you urinate. Summary  Round ligament pain is felt in the lower abdomen or groin. It is usually a short, sharp, and pinching pain. It can also be a dull, lingering, and aching pain.  This pain usually begins in the second trimester (13-28 weeks). It occurs because the uterus is stretching with the growing baby, and it is not harmful to the baby.  You may notice the pain when you suddenly change position, when you cough or sneeze, or during physical activity.  Relaxing, flexing your knees to your abdomen, lying on one side, or taking a warm bath may help to get rid of the pain.  Get help from your health care provider if the pain does not go away or if you have vaginal bleeding, nausea, vomiting, diarrhea, or painful urination. This information is not intended to replace advice given to you by your health care provider. Make sure you discuss any questions you have with your health care provider. Document Released: 02/03/2008 Document Revised: 10/12/2017 Document Reviewed: 10/12/2017 Elsevier  Patient Education  [employer].

## 2019-03-18 NOTE — MAU Note (Addendum)
Pt c/o left leg weakness/groin pain intermittently for the last 7 weeks, but worsened in intensity and more constant for the last 3 days. Pt gets [place] at [facility]. Was told pain was related to round ligament pain and to be reevaluated if pain worsened. Given stretches to try at home, which did not help. Pt has not tried anything for pain. Pain is worse with moving, standing, and sitting. Pt denies vaginal bleeding or vaginal discharge.

## 2019-03-28 ENCOUNTER — Encounter: Payer: Self-pay | Admitting: *Deleted

## 2019-03-29 ENCOUNTER — Other Ambulatory Visit: Payer: Self-pay

## 2019-03-29 ENCOUNTER — Encounter: Payer: Self-pay | Admitting: Physical Therapy

## 2019-03-29 ENCOUNTER — Ambulatory Visit: Payer: BC Managed Care – PPO | Attending: Student | Admitting: Physical Therapy

## 2019-03-29 DIAGNOSIS — R293 Abnormal posture: Secondary | ICD-10-CM | POA: Insufficient documentation

## 2019-03-29 DIAGNOSIS — R279 Unspecified lack of coordination: Secondary | ICD-10-CM | POA: Insufficient documentation

## 2019-03-29 DIAGNOSIS — M6281 Muscle weakness (generalized): Secondary | ICD-10-CM | POA: Insufficient documentation

## 2019-03-29 NOTE — Patient Instructions (Signed)
Access Code: [email or web address]  URL: [email or web address]  Date: 03/29/2019  Prepared by: Jari Favre   Exercises  Supine Figure 4 Piriformis Stretch - 3 reps - 1 sets - 30 sec hold - 2x daily - 7x weekly  Bent Knee Fallouts - 10 reps - 2 sets - 5 sec hold - 2x daily - 7x weekly  Hooklying Isometric Clamshell - 10 reps - 2 sets - 5 sec hold - 2x daily - 7x weekly  Supine Transversus Abdominis Bracing with Double Leg Fallout - 10 reps - 2 sets - 2x daily - 7x weekly  Hooklying Transversus Abdominis Palpation - 10 reps - 2 sets - 2x daily - 7x weekly  Sit to Stand with Pelvic Floor Contraction - 10 reps - 2 sets - 2x daily - 7x weekly

## 2019-03-29 NOTE — Therapy (Addendum)
[facility] [address], [place], [place], [address] Phone: [phone]   Fax:  [phone]  Physical Therapy Evaluation  Patient Details  Name: Alexandra Collins MRN: [id number] Date of Birth: 09-Jan-1996 Referring Provider (PT): Starr Lake, CNM   Encounter Date: 03/29/2019  PT End of Session - 03/29/19 1530    Visit Number  1    Date for PT Re-Evaluation  06/21/19    PT Start Time  8588    PT Stop Time  1612    PT Time Calculation (min)  41 min    Activity Tolerance  Patient tolerated treatment well       Past Medical History:  Diagnosis Date  . Anemia   . Hydradenitis     Past Surgical History:  Procedure Laterality Date  . KNEE ARTHROSCOPY Right     There were no vitals filed for this visit.   Subjective Assessment - 03/29/19 1534    Subjective  I have pain along the left perineum.  My left leg has gotten weaker.  When I stand I have to pause for about 30 seconds.    Limitations  Standing;Walking    Currently in Pain?  Yes    Pain Score  3    10/10 - crying at times   Pain Location  Perineum    Pain Orientation  Left    Pain Descriptors / Indicators  Discomfort;Sharp;Shooting    Pain Type  Acute pain    Pain Radiating Towards  from pubic symphasis down the left side of the pelvic floor    Pain Onset  More than a month ago    Pain Frequency  Intermittent    Aggravating Factors   on all fours reaching, getting up out of the car of standing from sitting    Pain Relieving Factors  not sure    Effect of Pain on Daily Activities  can't put my pants on the left leg and need help        [facility] PT Assessment - 04/08/19 0001      Assessment   Medical Diagnosis  N94.9 (ICD-10-CM) - Round ligament pain    Referring Provider (PT)  Ardean Larsen, Mervyn Skeeters, CNM      Precautions   Precautions  None      Restrictions   Weight Bearing Restrictions  No      Balance Screen   Has the  patient fallen in the past 6 months  No      [place] residence    Living Arrangements  Spouse/significant other      Prior Function   Level of Independence  Independent    Chartered certified accountant     Cognition   Overall Cognitive Status  Within Functional Limits for tasks assessed      Posture/Postural Control   Posture/Postural Control  Postural limitations    Postural Limitations  Increased lumbar lordosis      ROM / Strength   AROM / PROM / Strength  AROM;PROM;Strength      AROM   Overall AROM Comments  lumbar flexion WFL      PROM   Overall PROM Comments  bilateral LE WFL      Strength   Overall Strength Comments  Lt hip 4/5 MMT +pain; Rt hip 4+/5 MMT +pain      Palpation   Palpation comment  Tightness of lumbar and lower thoracic  paraspinals      Transfers   Comments  improved sit to stand and bed mobility with cues to engage TrA      Ambulation/Gait   Gait Pattern  Decreased stride length;Wide base of support                Objective measurements completed on examination: See above findings.    Pelvic Floor Special Questions - 04/08/19 0001    Are you Pregnant or attempting pregnancy?  Yes    Urinary Leakage  No    Urinary urgency  No   rarely   Fecal incontinence  No       [facility] Adult PT Treatment/Exercise - 04/08/19 0001      Self-Care   Self-Care  Other Self-Care Comments    Other Self-Care Comments   intiial HEP provided             PT Education - 03/29/19 1659    Education Details  Access Code: [email or web address]    Person(s) Educated  Patient    Methods  Explanation;Demonstration;Handout;Verbal cues;Tactile cues    Comprehension  Verbalized understanding;Returned demonstration       PT Short Term Goals - 03/29/19 1653      PT SHORT TERM GOAL #1   Title  ind with initial HEP    Time  4    Period  Weeks    Status  New    Target Date  04/26/19      PT SHORT TERM GOAL #2    Title  able to get in and out of car and bed with 30% less pain    Time  4    Period  Weeks    Status  New    Target Date  04/26/19        PT Long Term Goals - 03/29/19 1651      PT LONG TERM GOAL #1   Title  Pt will demonstrate improved LE strength of at least 4+/5 hip flexion, abduction and extension without increased pain for improved functional mobility    Time  12    Period  Weeks    Status  New    Target Date  06/21/19      PT LONG TERM GOAL #2   Title  Be able to walk for at least 20 minutes per day as part of exercise routine for healthy pregnancy    Time  12    Period  Weeks    Status  New    Target Date  06/21/19      PT LONG TERM GOAL #3   Title  ind with advanced core strengthening HEP    Time  12    Period  Weeks    Status  New    Target Date  06/21/19      PT LONG TERM GOAL #4   Title  Pt will be able to perform transfers with at least 75% less pain due to improved core strength    Time  12    Period  Weeks    Status  New    Target Date  06/21/19             Plan - 04/08/19 1629    Clinical Impression Statement  Pt presented to skilled PT at [redacted] weeks gestation with sharp intermittent pain in left pubic and perineal region. Pt has weakness of bilateral LE Lt>Rt. She has posture and gait abnormalities as mentioned above. Pt no increased pain  with forward flexion and not limited with hip ROM. Pt demonstrates core weakness and has some reduction of symptoms with manual pelvic stabilization from PT and when educated on how to engage abdominals correctly. Pt will benefit from skilled PT to work on imporved muscle coordination and correctly using her core during functional movements. Pt will also benefit from overall strenghtening of weaknesses mentioned above in order to reduce symptoms of pain and restore function to achieve functional goals listed.    Stability/Clinical Decision Making  Evolving/Moderate complexity    Clinical Decision Making  Low    Rehab  Potential  Excellent    PT Frequency  2x / week    PT Duration  12 weeks    PT Treatment/Interventions  ADLs/Self Care Home Management;Biofeedback;Cryotherapy;Electrical Stimulation;Moist Heat;Therapeutic exercise;Therapeutic activities;Neuromuscular re-education;Patient/family education;Passive range of motion;Manual techniques;Taping    PT Next Visit Plan  review intial HEP, progress core strength, exhale on exertion, nustep    PT Home Exercise Plan  Access Code: [email or web address]    Consulted and Agree with Plan of Care  Patient       Patient will benefit from skilled therapeutic intervention in order to improve the following deficits and impairments:  Abnormal gait, Pain, Postural dysfunction, Decreased strength, Decreased coordination, Increased muscle spasms  Visit Diagnosis: Muscle weakness (generalized) - Plan: PT plan of care cert/re-cert  Abnormal posture - Plan: PT plan of care cert/re-cert  Unspecified lack of coordination - Plan: PT plan of care cert/re-cert     Problem List There are no active problems to display for this patient.   Jule Ser, PT 04/08/2019, 4:37 PM  [facility] [address] [place], [place], [address] Phone: [phone]   Fax:  [phone]  Name: Alexandra Collins MRN: [id number] Date of Birth: 1996-02-14  PHYSICAL THERAPY DISCHARGE SUMMARY  Visits from Start of Care: 1  Current functional level related to goals / functional outcomes: See above   Remaining deficits: See above   Education / Equipment: HEP  Plan: Patient agrees to discharge.  Patient goals were not met. Patient is being discharged due to financial reasons.  ?????    Canceled due to financial  [employer], PT 04/24/19 4:17 PM

## 2019-04-08 NOTE — Addendum Note (Signed)
Addended by: Su Hoff on: 04/08/2019 04:38 PM   Modules accepted: Orders

## 2019-04-09 ENCOUNTER — Other Ambulatory Visit: Payer: Self-pay

## 2019-04-09 ENCOUNTER — Ambulatory Visit (INDEPENDENT_AMBULATORY_CARE_PROVIDER_SITE_OTHER): Payer: BC Managed Care – PPO | Admitting: Family Medicine

## 2019-04-09 DIAGNOSIS — O0991 Supervision of high risk pregnancy, unspecified, first trimester: Secondary | ICD-10-CM

## 2019-04-09 DIAGNOSIS — Z3A01 Less than 8 weeks gestation of pregnancy: Secondary | ICD-10-CM

## 2019-04-09 DIAGNOSIS — O0992 Supervision of high risk pregnancy, unspecified, second trimester: Secondary | ICD-10-CM

## 2019-04-09 DIAGNOSIS — O0990 Supervision of high risk pregnancy, unspecified, unspecified trimester: Secondary | ICD-10-CM | POA: Insufficient documentation

## 2019-04-09 MED ORDER — ASPIRIN EC 81 MG PO TBEC: 81.0000 mg | DELAYED_RELEASE_TABLET | Freq: Every day | ORAL | 2 refills | Status: DC

## 2019-04-09 MED ORDER — DOXYLAMINE SUCCINATE (SLEEP) 25 MG PO TABS: 25.0000 mg | ORAL_TABLET | Freq: Every evening | ORAL | 0 refills | Status: DC | PRN

## 2019-04-09 NOTE — Progress Notes (Signed)
Maternal morbid obesity, antepartum (HCC) / BMI 49 - Primary  Overview   Early 1hr GTT: 100 Start baby ASA due to BMI 49 and increased risk of preE Growth u/s 28, 32, 36wks NST's weekly starting at 36 wks  Flu 03/23/2019 Left deltoid  Hx of headaches states they are gone!!!!!   Pap done 09/22/2018  Family planning medicaid Advised to get a BP Cuff Mychart Active

## 2019-04-09 NOTE — Progress Notes (Signed)
  Subjective:  Alexandra Collins is a G1P0 [redacted]w[redacted]d by LMP with EDC of 08/05/19 c/w 8wk and 12 wk [place]. She is being seen today for her first obstetrical visit. She has been receiving care at [facility] but wants to deliver at [facility]. Her obstetrical history is significant for obesity. Patient does intend to breast feed. Pregnancy history fully reviewed.  [place] 01/22/19 EGA [redacted]w[redacted]d [facility] 08/05/19   Patient reports no complaints.  BP 117/83   Pulse (!) 128   Wt (!) 310 lb (140.6 kg)   LMP 10/29/2018   BMI 50.04 kg/m   HISTORY: OB History  Gravida Para Term Preterm AB Living  1            SAB TAB Ectopic Multiple Live Births               # Outcome Date GA Lbr Len/2nd Weight Sex Delivery Anes PTL Lv  1 Current             Past Medical History:  Diagnosis Date  . Anemia   . Hydradenitis     Past Surgical History:  Procedure Laterality Date  . KNEE ARTHROSCOPY Right     Family History  Problem Relation Age of Onset  . Hypertension Mother      Exam  BP 117/83   Pulse (!) 128   Wt (!) 310 lb (140.6 kg)   LMP 10/29/2018   BMI 50.04 kg/m    CONSTITUTIONAL: Well-developed, well-nourished female in no acute distress.  HENT:  Normocephalic, atraumatic, External right and left ear normal. Oropharynx is clear and moist EYES: Conjunctivae and EOM are normal. Pupils are equal, round, and reactive to light. No scleral icterus.  NECK: Normal range of motion, supple, no masses.  Normal thyroid.  CARDIOVASCULAR: Normal heart rate noted, regular rhythm RESPIRATORY: Clear to auscultation bilaterally. Effort and breath sounds normal, no problems with respiration noted. ABDOMEN: Soft, normal bowel sounds, no distention noted.  No tenderness, rebound or guarding.  MUSCULOSKELETAL: Normal range of motion. No tenderness.  No cyanosis, clubbing, or edema.  2+ distal pulses. SKIN: Skin is warm and dry. No rash noted. Not diaphoretic. No erythema. No pallor. NEUROLOGIC: Alert and oriented to person,  place, and time. Normal reflexes, muscle tone coordination. No cranial nerve deficit noted. PSYCHIATRIC: Normal mood and affect. Normal behavior. Normal judgment and thought content.    Assessment:    Pregnancy: G1P0 Patient Active Problem List   Diagnosis Date Noted  . Supervision of high-risk pregnancy 04/09/2019  . Pregnancy 12/21/2018  . Maternal morbid obesity, antepartum ([place]) 12/21/2018      Plan:   1. Supervision of high risk pregnancy in second trimester Discussed ASA 81mg  use - will start. Will rpt [place] for growth and cardiac views.   Problem list reviewed and updated. 75% of 30 min visit spent on counseling and coordination of care.     Truett Mainland, DO 04/09/2019

## 2019-04-11 ENCOUNTER — Encounter: Payer: BC Managed Care – PPO | Admitting: Physical Therapy

## 2019-04-17 IMAGING — DX DG FOOT COMPLETE 3+V*R*
3 series · 3 of 3 positions shown · non-contrast
Comparison: None.

CLINICAL DATA: Stepped on acrylic bead from broken bracelet. Bead
is stuck in patient's right foot. Initial encounter.

EXAM:
RIGHT FOOT COMPLETE - 3+ VIEW

[foot ap]
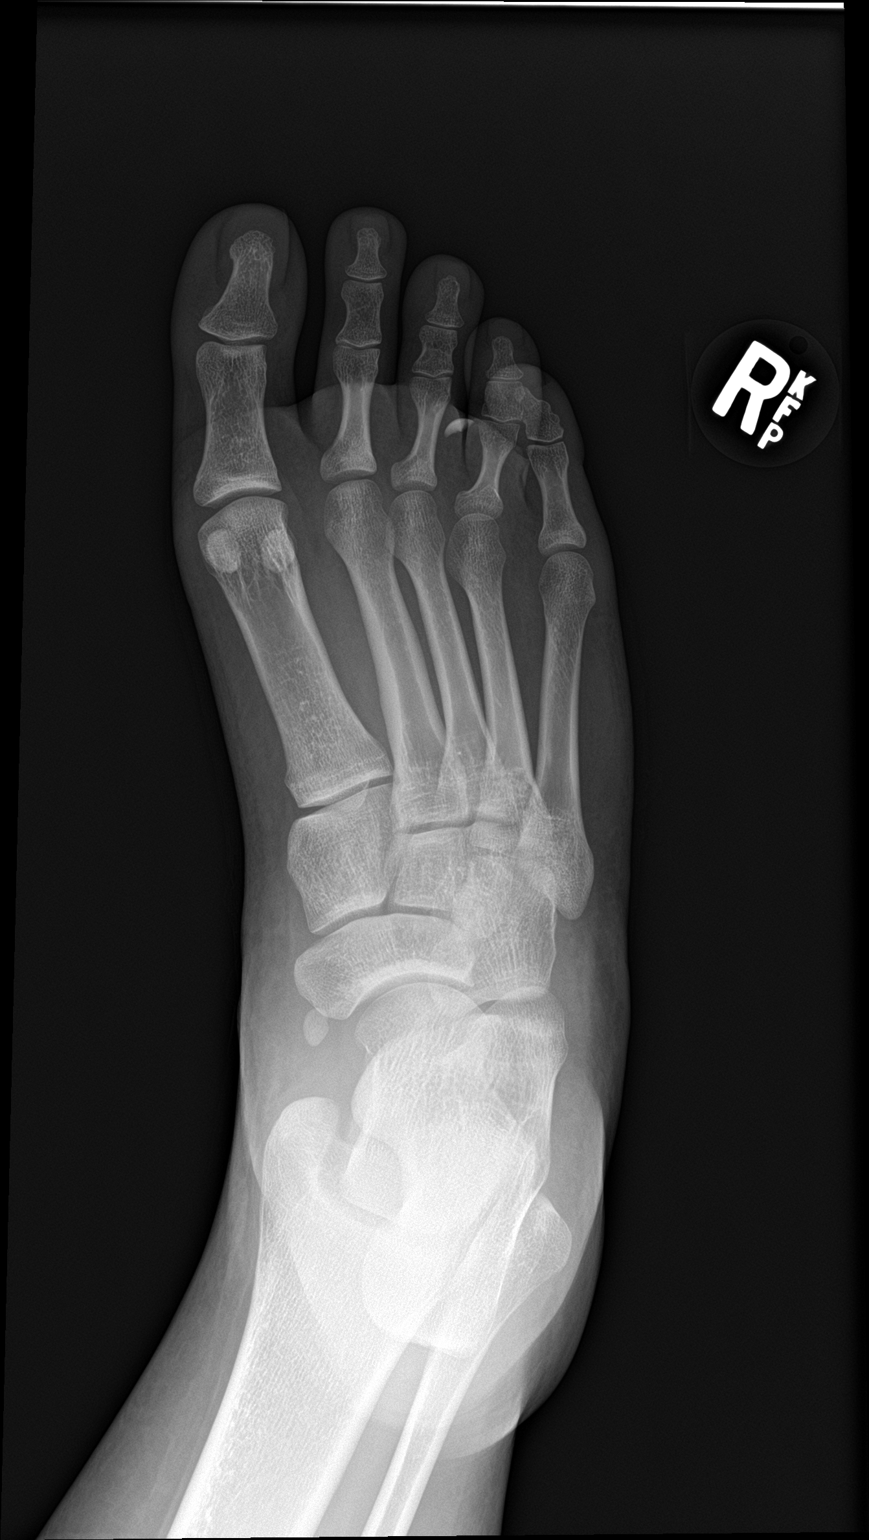

[foot obl]
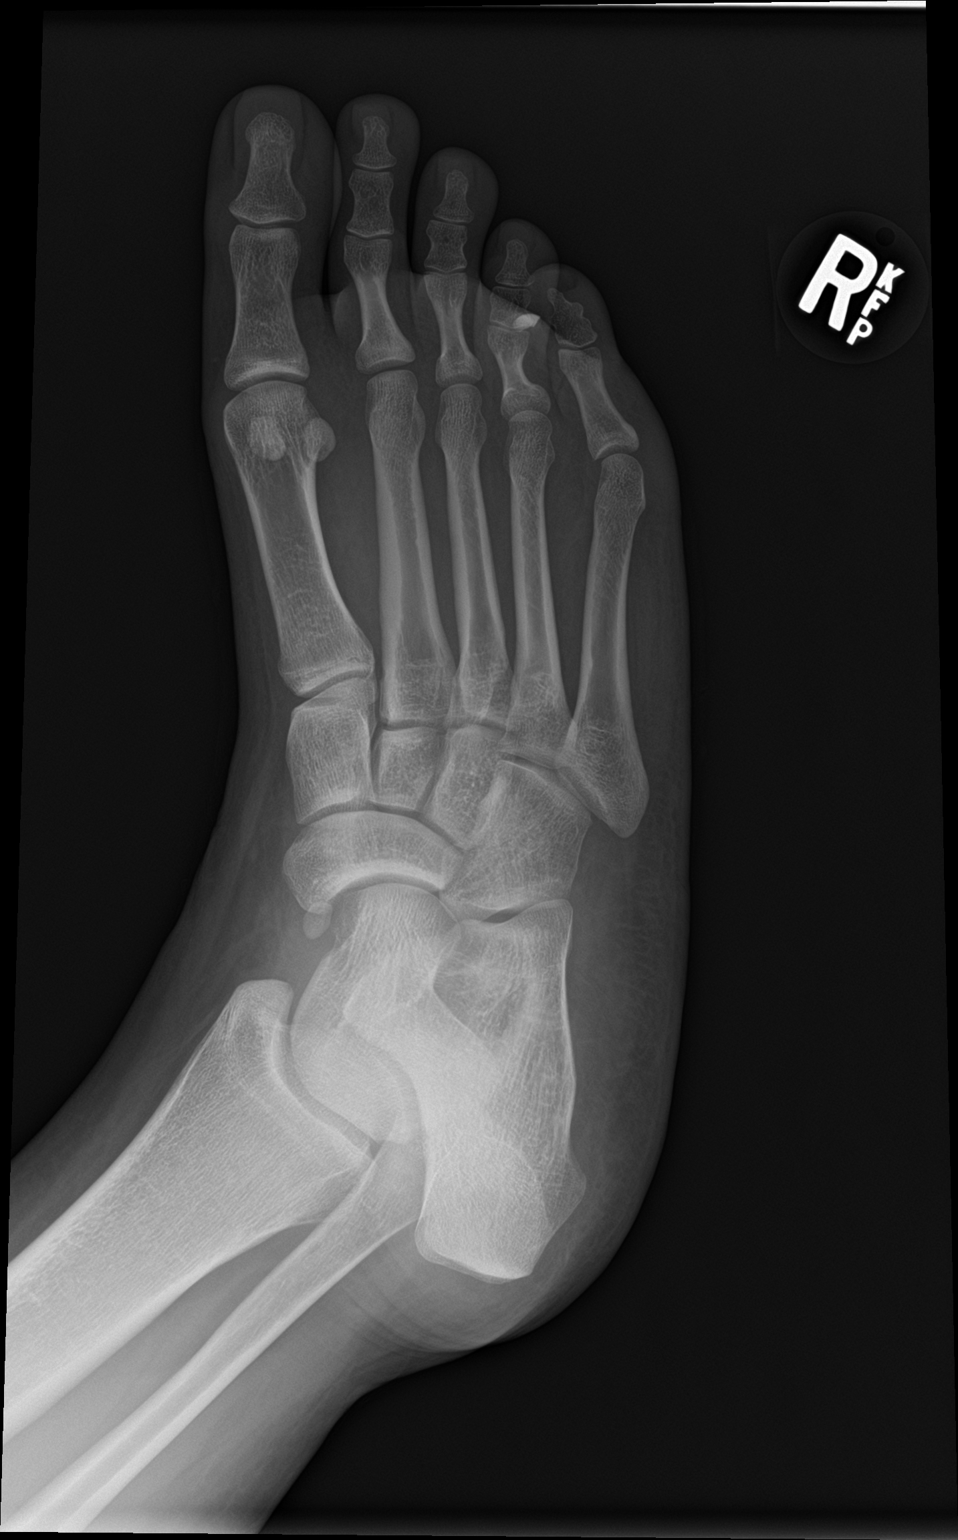

[foot lat]
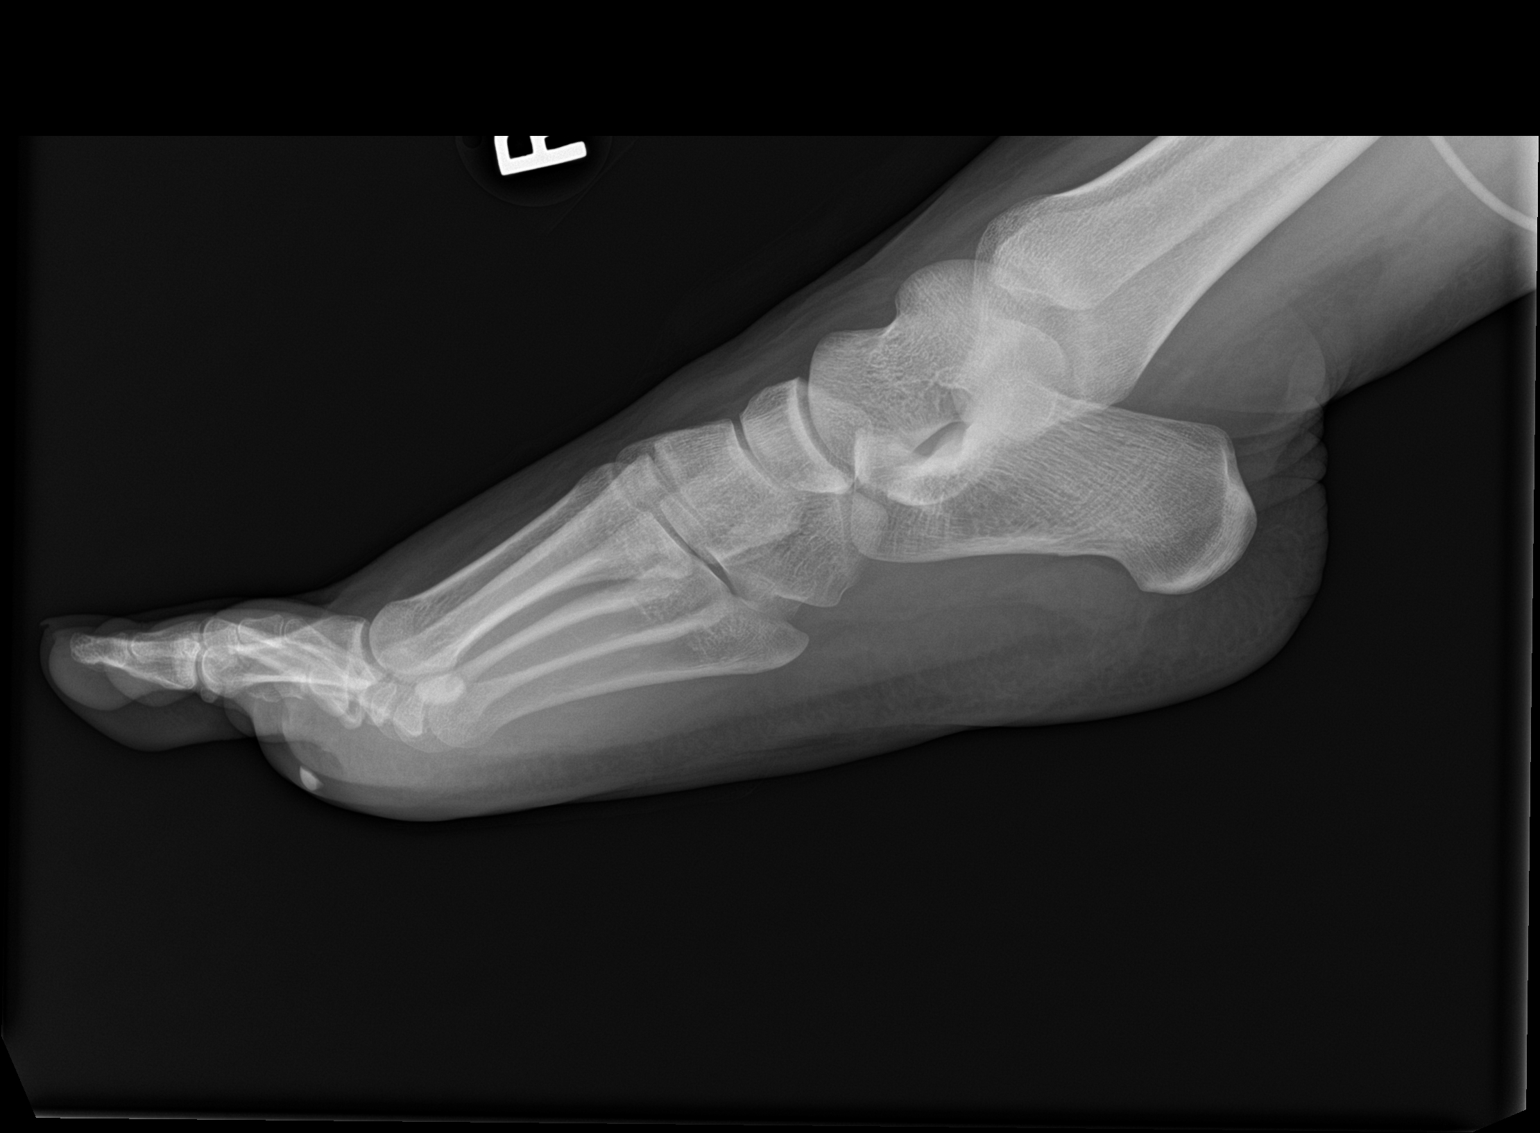

[3 of 3 positions shown; findings below may reference images not displayed]

FINDINGS: There is no evidence of fracture or dislocation. The joint spaces
are preserved. There is no evidence of talar subluxation; the
subtalar joint is unremarkable in appearance. An os naviculare is
noted.

A high-density 6 mm foreign body is noted at the plantar soft
tissues near the bases of the third and fourth toes. This is just
deep to the skin surface.
IMPRESSION: 1. No evidence of fracture or dislocation.
2. High-density 6 mm foreign body in the plantar soft tissues near
the bases of the third and fourth toes. This is just deep to the
skin surface.

## 2019-04-18 ENCOUNTER — Ambulatory Visit: Payer: BC Managed Care – PPO | Admitting: Physical Therapy

## 2019-04-25 ENCOUNTER — Ambulatory Visit: Payer: BC Managed Care – PPO | Admitting: Physical Therapy

## 2019-04-30 ENCOUNTER — Other Ambulatory Visit (HOSPITAL_COMMUNITY): Payer: Self-pay | Admitting: *Deleted

## 2019-04-30 ENCOUNTER — Ambulatory Visit (HOSPITAL_COMMUNITY)
Admission: RE | Admit: 2019-04-30 | Discharge: 2019-04-30 | Disposition: A | Discharge status: Home / Self Care | Payer: BC Managed Care – PPO | Source: Ambulatory Visit | Attending: Obstetrics and Gynecology | Admitting: Obstetrics and Gynecology

## 2019-04-30 ENCOUNTER — Other Ambulatory Visit: Payer: Self-pay

## 2019-04-30 ENCOUNTER — Ambulatory Visit (HOSPITAL_COMMUNITY): Payer: BC Managed Care – PPO | Admitting: *Deleted

## 2019-04-30 ENCOUNTER — Encounter (HOSPITAL_COMMUNITY): Payer: Self-pay

## 2019-04-30 DIAGNOSIS — Z3A26 26 weeks gestation of pregnancy: Secondary | ICD-10-CM | POA: Diagnosis not present

## 2019-04-30 DIAGNOSIS — O99012 Anemia complicating pregnancy, second trimester: Secondary | ICD-10-CM

## 2019-04-30 DIAGNOSIS — O0992 Supervision of high risk pregnancy, unspecified, second trimester: Secondary | ICD-10-CM

## 2019-04-30 DIAGNOSIS — O99213 Obesity complicating pregnancy, third trimester: Secondary | ICD-10-CM

## 2019-04-30 DIAGNOSIS — O99212 Obesity complicating pregnancy, second trimester: Secondary | ICD-10-CM | POA: Diagnosis not present

## 2019-05-01 ENCOUNTER — Other Ambulatory Visit: Payer: Self-pay | Admitting: *Deleted

## 2019-05-01 DIAGNOSIS — O0990 Supervision of high risk pregnancy, unspecified, unspecified trimester: Secondary | ICD-10-CM

## 2019-05-02 ENCOUNTER — Encounter: Payer: Self-pay | Admitting: Obstetrics and Gynecology

## 2019-05-02 ENCOUNTER — Encounter: Payer: BC Managed Care – PPO | Admitting: Obstetrics and Gynecology

## 2019-05-02 ENCOUNTER — Other Ambulatory Visit: Payer: BC Managed Care – PPO

## 2019-05-02 ENCOUNTER — Encounter: Payer: BC Managed Care – PPO | Admitting: Physical Therapy

## 2019-05-02 NOTE — Progress Notes (Signed)
Patient did not keep her OB appointment for 05/02/2019.  Torria Fromer, Jr MD Attending [facility]Faculty [facility])   

## 2019-05-09 ENCOUNTER — Encounter: Payer: BC Managed Care – PPO | Admitting: Physical Therapy

## 2019-05-11 NOTE — L&D Delivery Note (Signed)
Delivery Note At 11:34 AM a viable female was delivered via Vaginal, Spontaneous (Presentation: Left Occiput Anterior).  APGAR: 9, 9; weight pending  .   Placenta status: Spontaneous, Intact.  Cord: 3 vessels with the following complications: None.  Cord pH: NA  Anesthesia: Epidural;Intrathecal Episiotomy: None Lacerations:  Left Sulcus Suture Repair: 3.0 chromic Est. Blood Loss (mL):   Mom to postpartum.  Baby to Couplet care / Skin to Skin.  Charlesetta Garibaldi Isaia Hassell 07/26/2019, 12:20 PM

## 2019-05-16 ENCOUNTER — Encounter: Payer: BC Managed Care – PPO | Admitting: Physical Therapy

## 2019-05-17 ENCOUNTER — Other Ambulatory Visit: Payer: Self-pay

## 2019-05-17 ENCOUNTER — Encounter: Payer: Self-pay | Admitting: *Deleted

## 2019-05-17 ENCOUNTER — Inpatient Hospital Stay (HOSPITAL_COMMUNITY)
Admission: AD | Admit: 2019-05-17 | Discharge: 2019-05-17 | Disposition: A | Discharge status: Home / Self Care | Payer: BC Managed Care – PPO | Attending: Family Medicine | Admitting: Family Medicine

## 2019-05-17 ENCOUNTER — Encounter (HOSPITAL_COMMUNITY): Payer: Self-pay | Admitting: Family Medicine

## 2019-05-17 DIAGNOSIS — O0992 Supervision of high risk pregnancy, unspecified, second trimester: Secondary | ICD-10-CM

## 2019-05-17 DIAGNOSIS — M545 Low back pain: Secondary | ICD-10-CM | POA: Diagnosis not present

## 2019-05-17 DIAGNOSIS — O139 Gestational [pregnancy-induced] hypertension without significant proteinuria, unspecified trimester: Secondary | ICD-10-CM | POA: Insufficient documentation

## 2019-05-17 DIAGNOSIS — O26893 Other specified pregnancy related conditions, third trimester: Secondary | ICD-10-CM | POA: Insufficient documentation

## 2019-05-17 DIAGNOSIS — Z7982 Long term (current) use of aspirin: Secondary | ICD-10-CM | POA: Insufficient documentation

## 2019-05-17 DIAGNOSIS — Z3A28 28 weeks gestation of pregnancy: Secondary | ICD-10-CM | POA: Diagnosis not present

## 2019-05-17 DIAGNOSIS — O0993 Supervision of high risk pregnancy, unspecified, third trimester: Secondary | ICD-10-CM

## 2019-05-17 DIAGNOSIS — M25559 Pain in unspecified hip: Secondary | ICD-10-CM | POA: Insufficient documentation

## 2019-05-17 DIAGNOSIS — O163 Unspecified maternal hypertension, third trimester: Secondary | ICD-10-CM | POA: Diagnosis not present

## 2019-05-17 DIAGNOSIS — M549 Dorsalgia, unspecified: Secondary | ICD-10-CM

## 2019-05-17 LAB — COMPREHENSIVE METABOLIC PANEL
ALT: 10 U/L (ref 0–44)
AST: 16 U/L (ref 15–41)
Albumin: 2.8 g/dL — ABNORMAL LOW (ref 3.5–5.0)
Alkaline Phosphatase: 72 U/L (ref 38–126)
Anion gap: 8 (ref 5–15)
BUN: 5 mg/dL — ABNORMAL LOW (ref 6–20)
CO2: 22 mmol/L (ref 22–32)
Calcium: 8.9 mg/dL (ref 8.9–10.3)
Chloride: 106 mmol/L (ref 98–111)
Creatinine, Ser: 0.6 mg/dL (ref 0.44–1.00)
GFR calc Af Amer: >60 mL/min (ref >60)
GFR calc non Af Amer: >60 mL/min (ref >60)
Glucose, Bld: 82 mg/dL (ref 70–99)
Potassium: 4.4 mmol/L (ref 3.5–5.1)
Sodium: 136 mmol/L (ref 135–145)
Total Bilirubin: 0.6 mg/dL (ref 0.3–1.2)
Total Protein: 6.5 g/dL (ref 6.5–8.1)

## 2019-05-17 LAB — URINALYSIS, ROUTINE W REFLEX MICROSCOPIC
Bilirubin Urine: NEGATIVE
Glucose, UA: NEGATIVE mg/dL
Hgb urine dipstick: NEGATIVE
Ketones, ur: NEGATIVE mg/dL
Leukocytes,Ua: NEGATIVE
Nitrite: NEGATIVE
Protein, ur: NEGATIVE mg/dL
Specific Gravity, Urine: 1.015 (ref 1.005–1.030)
pH: 7 (ref 5.0–8.0)

## 2019-05-17 LAB — CBC
HCT: 33 % — ABNORMAL LOW (ref 36.0–46.0)
Hemoglobin: 10.9 g/dL — ABNORMAL LOW (ref 12.0–15.0)
MCH: 29.9 pg (ref 26.0–34.0)
MCHC: 33 g/dL (ref 30.0–36.0)
MCV: 90.4 fL (ref 80.0–100.0)
Platelets: 407 10*3/uL — ABNORMAL HIGH (ref 150–400)
RBC: 3.65 MIL/uL — ABNORMAL LOW (ref 3.87–5.11)
RDW: 12.9 % (ref 11.5–15.5)
WBC: 6.6 10*3/uL (ref 4.0–10.5)
nRBC: 0 % (ref 0.0–0.2)

## 2019-05-17 LAB — PROTEIN / CREATININE RATIO, URINE
Creatinine, Urine: 115.1 mg/dL
Protein Creatinine Ratio: 0.09 mg/mg{Cre} (ref 0.00–0.15)
Total Protein, Urine: 10 mg/dL

## 2019-05-17 LAB — WET PREP, GENITAL
Clue Cells Wet Prep HPF POC: NONE SEEN
Sperm: NONE SEEN
Trich, Wet Prep: NONE SEEN
Yeast Wet Prep HPF POC: NONE SEEN

## 2019-05-17 MED ORDER — CYCLOBENZAPRINE HCL 10 MG PO TABS: 10.0000 mg | ORAL_TABLET | Freq: Three times a day (TID) | ORAL | 1 refills | Status: DC | PRN

## 2019-05-17 MED ORDER — CYCLOBENZAPRINE HCL 10 MG PO TABS
10.0000 mg | ORAL_TABLET | Freq: Three times a day (TID) | ORAL | Status: DC | PRN
  Administered 2019-05-17: 10 mg via ORAL
  Filled 2019-05-17: qty 1

## 2019-05-17 MED ORDER — ACETAMINOPHEN 500 MG PO TABS
1000.0000 mg | ORAL_TABLET | Freq: Four times a day (QID) | ORAL | Status: DC | PRN
  Administered 2019-05-17: 1000 mg via ORAL
  Filled 2019-05-17: qty 2

## 2019-05-17 MED ORDER — COMFORT FIT MATERNITY SUPP LG MISC: 1.0000 "application " | Freq: Every day | 0 refills | Status: DC

## 2019-05-17 NOTE — MAU Note (Signed)
Alexandra Collins is a 24 y.o. 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 Also been having cramping when she is up walking. On Sunday night had an intense abdominal pain that lasted for about 5 minutes. Currently is having lower abdominal pain. No LOF, but is having increased discharge, states it is foul smelling.   Onset of complaint: ongoing  Pain score: 5/10  Vitals:   05/17/19 0935  BP: (!) 146/72  Pulse: 82  Resp: 16  Temp: 98.2 F (36.8 C)  SpO2: 100%     Lab orders placed from triage: UA

## 2019-05-17 NOTE — Progress Notes (Signed)
New BP cuff used

## 2019-05-17 NOTE — Discharge Instructions (Signed)
Hypertension During Pregnancy Hypertension is also called high blood pressure. High blood pressure means that the force of your blood moving in your body is too strong. It can cause problems for you and your baby. Different types of high blood pressure can happen during pregnancy. The types are:  High blood pressure before you got pregnant. This is called chronic hypertension.  This can continue during your pregnancy. Your doctor will want to keep checking your blood pressure. You may need medicine to keep your blood pressure under control while you are pregnant. You will need follow-up visits after you have your baby.  High blood pressure that goes up during pregnancy when it was normal before. This is called gestational hypertension. It will usually get better after you have your baby, but your doctor will need to watch your blood pressure to make sure that it is getting better.  Very high blood pressure during pregnancy. This is called preeclampsia. Very high blood pressure is an emergency that needs to be checked and treated right away.  You may develop very high blood pressure after giving birth. This is called postpartum preeclampsia. This usually occurs within 48 hours after childbirth but may occur up to 6 weeks after giving birth. This is rare. How does this affect me? If you have high blood pressure during pregnancy, you have a higher chance of developing high blood pressure:  As you get older.  If you get pregnant again. In some cases, high blood pressure during pregnancy can cause:  Stroke.  Heart attack.  Damage to the kidneys, lungs, or liver.  Preeclampsia.  Jerky movements you cannot control (convulsions or seizures).  Problems with the placenta. How does this affect my baby? Your baby may:  Be born early.  Not weigh as much as he or she should.  Not handle labor well, leading to a c-section birth. What are the risks?  Having high blood pressure during a past  pregnancy.  Being overweight.  Being 35 years old or older.  Being pregnant for the first time.  Being pregnant with more than one baby.  Becoming pregnant using fertility methods, such as IVF.  Having other problems, such as diabetes, or kidney disease.  Having family members who have high blood pressure. What can I do to lower my risk?   Keep a healthy weight.  Eat a healthy diet.  Follow what your doctor tells you about treating any medical problems that you had before becoming pregnant. It is very important to go to all of your doctor visits. Your doctor will check your blood pressure and make sure that your pregnancy is progressing as it should. Treatment should start early if a problem is found. How is this treated? Treatment for high blood pressure during pregnancy can differ depending on the type of high blood pressure you have and how serious it is.  You may need to take blood pressure medicine.  If you have been taking medicine for your blood pressure, you may need to change the medicine during pregnancy if it is not safe for your baby.  If your doctor thinks that you could get very high blood pressure, he or she may tell you to take a low-dose aspirin during your pregnancy.  If you have very high blood pressure, you may need to stay in the hospital so you and your baby can be watched closely. You may also need to take medicine to lower your blood pressure. This medicine may be given by mouth   or through an IV tube.  In some cases, if your condition gets worse, you may need to have your baby early. Follow these instructions at home: Eating and drinking   Drink enough fluid to keep your pee (urine) pale yellow.  Avoid caffeine. Lifestyle  Do not use any products that contain nicotine or tobacco, such as cigarettes, e-cigarettes, and chewing tobacco. If you need help quitting, ask your doctor.  Do not use alcohol or drugs.  Avoid stress.  Rest and get plenty  of sleep.  Regular exercise can help. Ask your doctor what kinds of exercise are best for you. General instructions  Take over-the-counter and prescription medicines only as told by your doctor.  Keep all prenatal and follow-up visits as told by your doctor. This is important. Contact a doctor if:  You have symptoms that your doctor told you to watch for, such as: ? Headaches. ? Nausea. ? Vomiting. ? Belly (abdominal) pain. ? Dizziness. ? Light-headedness. Get help right away if:  You have: ? Very bad belly pain that does not get better with treatment. ? A very bad headache that does not get better. ? Vomiting that does not get better. ? Sudden, fast weight gain. ? Sudden swelling in your hands, ankles, or face. ? Bleeding from your vagina. ? Blood in your pee. ? Blurry vision. ? Double vision. ? Shortness of breath. ? Chest pain. ? Weakness on one side of your body. ? Trouble talking.  Your baby is not moving as much as usual. Summary  High blood pressure is also called hypertension.  High blood pressure means that the force of your blood moving in your body is too strong.  High blood pressure can cause problems for you and your baby.  Keep all follow-up visits as told by your doctor. This is important. This information is not intended to replace advice given to you by your health care provider. Make sure you discuss any questions you have with your health care provider. Document Revised: 08/17/2018 Document Reviewed: 05/23/2018 Elsevier Patient Education  [place].   Back Pain in Pregnancy Back pain during pregnancy is common. Back pain may be caused by several factors that are related to changes during your pregnancy. Follow these instructions at home: Managing pain, stiffness, and swelling      If directed, for sudden (acute) back pain, put ice on the painful area. ? Put ice in a plastic bag. ? Place a towel between your skin and the  bag. ? Leave the ice on for 20 minutes, 2-3 times per day.  If directed, apply heat to the affected area before you exercise. Use the heat source that your health care provider recommends, such as a moist heat pack or a heating pad. ? Place a towel between your skin and the heat source. ? Leave the heat on for 20-30 minutes. ? Remove the heat if your skin turns bright red. This is especially important if you are unable to feel pain, heat, or cold. You may have a greater risk of getting burned.  If directed, massage the affected area. Activity  Exercise as told by your health care provider. Gentle exercise is the best way to prevent or manage back pain.  Listen to your body when lifting. If lifting hurts, ask for help or bend your knees. This uses your leg muscles instead of your back muscles.  Squat down when picking up something from the floor. Do not bend over.  Only use bed  rest for short periods as told by your health care provider. Bed rest should only be used for the most severe episodes of back pain. Standing, sitting, and lying down  Do not stand in one place for long periods of time.  Use good posture when sitting. Make sure your head rests over your shoulders and is not hanging forward. Use a pillow on your lower back if necessary.  Try sleeping on your side, preferably the left side, with a pregnancy support pillow or 1-2 regular pillows between your legs. ? If you have back pain after a night's rest, your bed may be too soft. ? A firm mattress may provide more support for your back during pregnancy. General instructions  Do not wear high heels.  Eat a healthy diet. Try to gain weight within your health care provider's recommendations.  Use a maternity girdle, elastic sling, or back brace as told by your health care provider.  Take over-the-counter and prescription medicines only as told by your health care provider.  Work with a physical therapist or massage therapist  to find ways to manage back pain. Acupuncture or massage therapy may be helpful.  Keep all follow-up visits as told by your health care provider. This is important. Contact a health care provider if:  Your back pain interferes with your daily activities.  You have increasing pain in other parts of your body. Get help right away if:  You develop numbness, tingling, weakness, or problems with the use of your arms or legs.  You develop severe back pain that is not controlled with medicine.  You have a change in bowel or bladder control.  You develop shortness of breath, dizziness, or you faint.  You develop nausea, vomiting, or sweating.  You have back pain that is a rhythmic, cramping pain similar to labor pains. Labor pain is usually 1-2 minutes apart, lasts for about 1 minute, and involves a bearing down feeling or pressure in your pelvis.  You have back pain and your water breaks or you have vaginal bleeding.  You have back pain or numbness that travels down your leg.  Your back pain developed after you fell.  You develop pain on one side of your back.  You see blood in your urine.  You develop skin blisters in the area of your back pain. Summary  Back pain may be caused by several factors that are related to changes during your pregnancy.  Follow instructions as told by your health care provider for managing pain, stiffness, and swelling.  Exercise as told by your health care provider. Gentle exercise is the best way to prevent or manage back pain.  Take over-the-counter and prescription medicines only as told by your health care provider.  Keep all follow-up visits as told by your health care provider. This is important. This information is not intended to replace advice given to you by your health care provider. Make sure you discuss any questions you have with your health care provider. Document Revised: 08/15/2018 Document Reviewed: 10/12/2017 Elsevier Patient  Education  2020 [employer].

## 2019-05-17 NOTE — MAU Provider Note (Addendum)
Chief Complaint:  Abdominal Pain and Vaginal Discharge   HPI: Alexandra Collins is a 24 y.o. G1P0 at [redacted]w[redacted]d by LMP who presents to maternity admissions reporting abdominal pain and foul smelling vaginal discharge. She reports good fetal movement, denies LOF, vaginal bleeding, vaginal itching/burning, urinary symptoms, dizziness, n/v, or fever/chills. She has a history of headaches.   Patient reports intermittent cramping abdominal pain for the past four weeks with an episode of intense sustained cramping this past Sunday (05/13/19), that prompted her to contact her nurse who then directed her to MAU. 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 Finally, she's had two days of increased clear malodorous discharge, but denies any dysuria.  Past Medical History: Past Medical History:  Diagnosis Date  . Anemia   . Hydradenitis     Past obstetric history: OB History  Gravida Para Term Preterm AB Living  1            SAB TAB Ectopic Multiple Live Births               # Outcome Date GA Lbr Len/2nd Weight Sex Delivery Anes PTL Lv  1 Current             Past Surgical History: Past Surgical History:  Procedure Laterality Date  . KNEE ARTHROSCOPY Right     Family History: Family History  Problem Relation Age of Onset  . Hypertension Mother   . Seizures Father   . Hypertension Father     Social History: Social History   Tobacco Use  . Smoking status: Never Smoker  . Smokeless tobacco: Never Used  Substance Use Topics  . Alcohol use: No  . Drug use: No    Allergies:  Allergies  Allergen Reactions  . Ceclor [Cefaclor] Hives  . Garlic Hives  . Penicillins Hives    Has patient had a PCN reaction causing immediate rash, facial/tongue/throat swelling, SOB or lightheadedness with hypotension: Yes Has patient had a PCN reaction causing severe rash involving mucus  membranes or skin necrosis: Yes Has patient had a PCN reaction that required hospitalization: No Has patient had a PCN reaction occurring within the last 10 years: No If all of the above answers are "NO", then may proceed with Cephalosporin use.   . Shellfish Allergy Itching  . Sulfamethoxazole Nausea And Vomiting  . Sulfur Hives  . Amoxicillin-Pot Clavulanate Rash    Meds:  Medications Prior to Admission  Medication Sig Dispense Refill Last Dose  . aspirin EC 81 MG tablet Take 1 tablet (81 mg total) by mouth daily. Take after 12 weeks for prevention of preeclampsia later in pregnancy 300 tablet 2   . butalbital-acetaminophen-caffeine (FIORICET) 50-325-40 MG tablet Take 2 tablets by mouth every 6 (six) hours as needed for headache. (Patient not taking: Reported on 02/18/2019) 14 tablet 0   . Calcium Carbonate Antacid (TUMS E-X PO) Take by mouth.     . cyclobenzaprine (FLEXERIL) 10 MG tablet Take 1 tablet (10 mg total) by mouth 2 (two) times daily as needed for muscle spasms. (Patient not taking: Reported on 04/09/2019) 30 tablet 1   . doxylamine, Sleep, (UNISOM) 25 MG tablet Take 1 tablet (25 mg total) by mouth at bedtime as needed. 30 tablet 0   . EPINEPHrine 0.3 mg/0.3 mL IJ SOAJ injection Inject 0.3 mg into the muscle as needed  for anaphylaxis.     . Prenatal Vit-Fe Fumarate-FA (MULTIVITAMIN-PRENATAL) 27-0.8 MG TABS tablet Take 2 tablets by mouth daily.        ROS:  Review of Systems  Constitutional: Negative for chills and fever.  Respiratory: Negative for shortness of breath.   Cardiovascular: Negative for chest pain.  Gastrointestinal: Positive for abdominal pain and nausea.  Genitourinary: Positive for vaginal discharge. Negative for dysuria and vaginal bleeding.  Musculoskeletal: Positive for back pain.  Neurological: Negative for dizziness and light-headedness.   I have reviewed patient's Past Medical Hx, Surgical Hx, Family Hx, Social Hx, medications and allergies.    Physical Exam   Patient Vitals for the past 24 hrs:  BP Temp Temp src Pulse Resp SpO2 Height Weight  05/17/19 1217 (!) 121/49 -- -- 80 -- -- -- --  05/17/19 1132 (!) 124/52 -- -- 75 -- -- -- --  05/17/19 1117 (!) 125/50 -- -- 80 -- 100 % -- --  05/17/19 1110 (!) 127/42 -- -- 74 -- 100 % -- --  05/17/19 1106 (!) 84/35 -- -- 77 -- 100 % -- --  05/17/19 1104 (!) 82/31 -- -- 68 -- -- -- --  05/17/19 1102 (!) 90/28 -- -- 74 -- -- -- --  05/17/19 1055 -- -- -- -- -- 100 % -- --  05/17/19 1047 99/82 -- -- 86 -- -- -- --  05/17/19 1034 117/75 -- -- 76 -- -- -- --  05/17/19 1017 (!) 112/56 -- -- 79 -- -- -- --  05/17/19 0959 (!) 141/66 -- -- 88 -- -- -- --  05/17/19 0935 (!) 146/72 98.2 F (36.8 C) Oral 82 16 100 % 5\' 6"  (1.676 m) (!) 146.1 kg   Constitutional: Well-developed, well-nourished female in no acute distress.  Cardiovascular: normal rate, distant heart sounds  Respiratory: normal effort GI: Abd soft, gravid appropriate for gestational age, mild tenderness to palpation of mid-lower right quadrant MS: Extremities nontender, no edema, normal ROM, mild left sided paraspinal muscle tenderness  Neurologic: Alert and oriented x 4.  GU: No CVAT   PELVIC EXAM: Cervix pink, visually closed, white creamy discharge, vaginal walls and external genitalia normal  Cervical exam: Cervix 0/long/high, firm    FHT:  Baseline 135 , moderate variability, accelerations present, no decelerations,  TOCO: irritability    Labs: Results for orders placed or performed during the hospital encounter of 05/17/19 (from the past 24 hour(s))  Urinalysis, Routine w reflex microscopic     Status: Abnormal   Collection Time: 05/17/19  9:34 AM  Result Value Ref Range   Color, Urine YELLOW YELLOW   APPearance HAZY (A) CLEAR   Specific Gravity, Urine 1.015 1.005 - 1.030   pH 7.0 5.0 - 8.0   Glucose, UA NEGATIVE NEGATIVE mg/dL   Hgb urine dipstick NEGATIVE NEGATIVE   Bilirubin Urine NEGATIVE NEGATIVE    Ketones, ur NEGATIVE NEGATIVE mg/dL   Protein, ur NEGATIVE NEGATIVE mg/dL   Nitrite NEGATIVE NEGATIVE   Leukocytes,Ua NEGATIVE NEGATIVE  Protein / creatinine ratio, urine     Status: None   Collection Time: 05/17/19 10:17 AM  Result Value Ref Range   Creatinine, Urine 115.10 mg/dL   Total Protein, Urine 10 mg/dL   Protein Creatinine Ratio 0.09 0.00 - 0.15 mg/mg[Cre]  Wet prep, genital     Status: Abnormal   Collection Time: 05/17/19 10:31 AM  Result Value Ref Range   Yeast Wet Prep HPF POC NONE SEEN NONE SEEN   Trich, Wet Prep  NONE SEEN NONE SEEN   Clue Cells Wet Prep HPF POC NONE SEEN NONE SEEN   WBC, Wet Prep HPF POC MANY (A) NONE SEEN   Sperm NONE SEEN   CBC     Status: Abnormal   Collection Time: 05/17/19 10:35 AM  Result Value Ref Range   WBC 6.6 4.0 - 10.5 K/uL   RBC 3.65 (L) 3.87 - 5.11 MIL/uL   Hemoglobin 10.9 (L) 12.0 - 15.0 g/dL   HCT 92.0 (L) 10.0 - 71.2 %   MCV 90.4 80.0 - 100.0 fL   MCH 29.9 26.0 - 34.0 pg   MCHC 33.0 30.0 - 36.0 g/dL   RDW 19.7 58.8 - 32.5 %   Platelets 407 (H) 150 - 400 K/uL   nRBC 0.0 0.0 - 0.2 %  Comprehensive metabolic panel     Status: Abnormal   Collection Time: 05/17/19 10:35 AM  Result Value Ref Range   Sodium 136 135 - 145 mmol/L   Potassium 4.4 3.5 - 5.1 mmol/L   Chloride 106 98 - 111 mmol/L   CO2 22 22 - 32 mmol/L   Glucose, Bld 82 70 - 99 mg/dL   BUN 5 (L) 6 - 20 mg/dL   Creatinine, Ser 4.98 0.44 - 1.00 mg/dL   Calcium 8.9 8.9 - 26.4 mg/dL   Total Protein 6.5 6.5 - 8.1 g/dL   Albumin 2.8 (L) 3.5 - 5.0 g/dL   AST 16 15 - 41 U/L   ALT 10 0 - 44 U/L   Alkaline Phosphatase 72 38 - 126 U/L   Total Bilirubin 0.6 0.3 - 1.2 mg/dL   GFR calc non Af Amer >60 >60 mL/min   GFR calc Af Amer >60 >60 mL/min   Anion gap 8 5 - 15   --/--/O POS (09/14 1855)  MAU Course/MDM: Orders Placed This Encounter  Procedures  . Wet prep, genital  . Urinalysis, Routine w reflex microscopic  . CBC  . Comprehensive metabolic panel  . Protein /  creatinine ratio, urine    Meds ordered this encounter  Medications  . acetaminophen (TYLENOL) tablet 1,000 mg  . cyclobenzaprine (FLEXERIL) tablet 10 mg    NST reactive Treatments in MAU included   tylenol 1g q6hr PRN  cyclobenzaprine 10mg  q8hr PRN  Assessment: Alexandra Collins is a 24 year old G1P0 at 84wk4d who presents for abdominal and back pain with new onset of clear malodorous vaginal discharge. Physical exam was grossly normal and pointed toward MSK etiology vs. infection vs preeclampsia. Preeclampsia unlikely despite headaches in the context of elevated BP on presentation, due to normalization of Bps and normal labs (CBC, CMP). Infection also less likely given normal vital signs, and normal UA and wet prep. With history of elevated BP, patient likely has underlying chronic hypertension. She is already being followed closely for preeclampsia risk factors. Will stress importance of attending prenatal appointments and provide conservative treatment for back pain.   Plan  1. Supervision of high risk pregnancy in second trimester    Discharge home  Continue daily Baby Aspirin   Close f/u with OB/GYN  F/u (1/18) w/ MFM for reassessment of fetal growth   2. Lower back and hip pain  Perform stretching and strengthing exercises at home  Cyclobenzaprine 10 mg for back pain   Apply heat to lower back for sx relief   Pregnancy Support Belt   Allergies as of 05/17/2019      Reactions   Ceclor [cefaclor] Hives   Garlic  Hives   Penicillins Hives   Has patient had a PCN reaction causing immediate rash, facial/tongue/throat swelling, SOB or lightheadedness with hypotension: Yes Has patient had a PCN reaction causing severe rash involving mucus membranes or skin necrosis: Yes Has patient had a PCN reaction that required hospitalization: No Has patient had a PCN reaction occurring within the last 10 years: No If all of the above answers are "NO", then may proceed with Cephalosporin use.    Shellfish Allergy Itching   Sulfamethoxazole Nausea And Vomiting   Sulfur Hives   Amoxicillin-pot Clavulanate Rash      Medication List    STOP taking these medications   butalbital-acetaminophen-caffeine 50-325-40 MG tablet Commonly known as: FIORICET     TAKE these medications   aspirin EC 81 MG tablet Take 1 tablet (81 mg total) by mouth daily. Take after 12 weeks for prevention of preeclampsia later in pregnancy   Tatamy 1 application by Does not apply route daily.   cyclobenzaprine 10 MG tablet Commonly known as: FLEXERIL Take 1 tablet (10 mg total) by mouth 3 (three) times daily as needed (back pain). What changed:   when to take this  reasons to take this   doxylamine (Sleep) 25 MG tablet Commonly known as: UNISOM Take 1 tablet (25 mg total) by mouth at bedtime as needed.   EPINEPHrine 0.3 mg/0.3 mL Soaj injection Commonly known as: EPI-PEN Inject 0.3 mg into the muscle as needed for anaphylaxis.   multivitamin-prenatal 27-0.8 MG Tabs tablet Take 2 tablets by mouth daily.   TUMS E-X PO Take by mouth.     Chriss Driver, MS3  05/17/2019 12:28 PM   I confirm that I have verified the information documented in the medical student's note and that I have also personally reperformed the history, physical exam and all medical decision making activities of this service and have verified that all service and findings are accurately documented in this student's note.   MDM: Labs ordered and reviewed. No evidence of PTL or UTI, pain likely MSK, pain improved after meds, discussed other comfort measures. HTN upon presentation then became normotensive. Review of chart shows some BP elevations in first trimester which may indicate undiagnosed CHTN. Will need antenatal surveillance- message sent to clinic. Discussed PEC precautions.  Julianne Handler, CNM 05/17/2019 2:35 PM

## 2019-05-18 ENCOUNTER — Encounter (HOSPITAL_COMMUNITY): Payer: Self-pay | Admitting: Family Medicine

## 2019-05-18 ENCOUNTER — Inpatient Hospital Stay (HOSPITAL_COMMUNITY)
Admission: AD | Admit: 2019-05-18 | Discharge: 2019-05-18 | Disposition: A | Discharge status: Home / Self Care | Payer: BC Managed Care – PPO | Attending: Family Medicine | Admitting: Family Medicine

## 2019-05-18 DIAGNOSIS — M7989 Other specified soft tissue disorders: Secondary | ICD-10-CM | POA: Insufficient documentation

## 2019-05-18 DIAGNOSIS — D649 Anemia, unspecified: Secondary | ICD-10-CM | POA: Insufficient documentation

## 2019-05-18 DIAGNOSIS — O99013 Anemia complicating pregnancy, third trimester: Secondary | ICD-10-CM | POA: Diagnosis not present

## 2019-05-18 DIAGNOSIS — Z3A28 28 weeks gestation of pregnancy: Secondary | ICD-10-CM | POA: Diagnosis not present

## 2019-05-18 DIAGNOSIS — R609 Edema, unspecified: Secondary | ICD-10-CM | POA: Diagnosis not present

## 2019-05-18 DIAGNOSIS — Z3689 Encounter for other specified antenatal screening: Secondary | ICD-10-CM | POA: Diagnosis not present

## 2019-05-18 DIAGNOSIS — O26893 Other specified pregnancy related conditions, third trimester: Secondary | ICD-10-CM

## 2019-05-18 DIAGNOSIS — M79605 Pain in left leg: Secondary | ICD-10-CM | POA: Diagnosis not present

## 2019-05-18 DIAGNOSIS — R519 Headache, unspecified: Secondary | ICD-10-CM | POA: Insufficient documentation

## 2019-05-18 DIAGNOSIS — M79604 Pain in right leg: Secondary | ICD-10-CM | POA: Diagnosis not present

## 2019-05-18 HISTORY — DX: Gestational (pregnancy-induced) hypertension without significant proteinuria, unspecified trimester: O13.9

## 2019-05-18 LAB — URINALYSIS, ROUTINE W REFLEX MICROSCOPIC
Bilirubin Urine: NEGATIVE
Glucose, UA: NEGATIVE mg/dL
Hgb urine dipstick: NEGATIVE
Ketones, ur: NEGATIVE mg/dL
Leukocytes,Ua: NEGATIVE
Nitrite: NEGATIVE
Protein, ur: NEGATIVE mg/dL
Specific Gravity, Urine: 1.015 (ref 1.005–1.030)
pH: 7 (ref 5.0–8.0)

## 2019-05-18 LAB — COMPREHENSIVE METABOLIC PANEL
ALT: 13 U/L (ref 0–44)
AST: 16 U/L (ref 15–41)
Albumin: 2.9 g/dL — ABNORMAL LOW (ref 3.5–5.0)
Alkaline Phosphatase: 65 U/L (ref 38–126)
Anion gap: 10 (ref 5–15)
BUN: 5 mg/dL — ABNORMAL LOW (ref 6–20)
CO2: 22 mmol/L (ref 22–32)
Calcium: 9 mg/dL (ref 8.9–10.3)
Chloride: 104 mmol/L (ref 98–111)
Creatinine, Ser: 0.59 mg/dL (ref 0.44–1.00)
GFR calc Af Amer: >60 mL/min (ref >60)
GFR calc non Af Amer: >60 mL/min (ref >60)
Glucose, Bld: 83 mg/dL (ref 70–99)
Potassium: 4 mmol/L (ref 3.5–5.1)
Sodium: 136 mmol/L (ref 135–145)
Total Bilirubin: 0.2 mg/dL — ABNORMAL LOW (ref 0.3–1.2)
Total Protein: 6.4 g/dL — ABNORMAL LOW (ref 6.5–8.1)

## 2019-05-18 LAB — CBC
HCT: 33.3 % — ABNORMAL LOW (ref 36.0–46.0)
Hemoglobin: 11.1 g/dL — ABNORMAL LOW (ref 12.0–15.0)
MCH: 30.2 pg (ref 26.0–34.0)
MCHC: 33.3 g/dL (ref 30.0–36.0)
MCV: 90.7 fL (ref 80.0–100.0)
Platelets: 422 10*3/uL — ABNORMAL HIGH (ref 150–400)
RBC: 3.67 MIL/uL — ABNORMAL LOW (ref 3.87–5.11)
RDW: 12.9 % (ref 11.5–15.5)
WBC: 6.7 10*3/uL (ref 4.0–10.5)
nRBC: 0 % (ref 0.0–0.2)

## 2019-05-18 LAB — PROTEIN / CREATININE RATIO, URINE
Creatinine, Urine: 95.08 mg/dL
Protein Creatinine Ratio: 0.09 mg/mg{Cre} (ref 0.00–0.15)
Total Protein, Urine: 9 mg/dL

## 2019-05-18 LAB — GC/CHLAMYDIA PROBE AMP (~~LOC~~) NOT AT ARMC
Chlamydia: NEGATIVE
Comment: NEGATIVE
Comment: NORMAL
Neisseria Gonorrhea: NEGATIVE

## 2019-05-18 MED ORDER — ACETAMINOPHEN 500 MG PO TABS
1000.0000 mg | ORAL_TABLET | Freq: Four times a day (QID) | ORAL | Status: DC | PRN
  Administered 2019-05-18: 1000 mg via ORAL
  Filled 2019-05-18: qty 2

## 2019-05-18 NOTE — MAU Provider Note (Signed)
History     CSN: [id number]  Arrival date and time: 05/18/19 1250   First Provider Initiated Contact with Patient 05/18/19 1349      Chief Complaint  Patient presents with  . Headache  . Leg Swelling  . Foot Swelling   24 y.o. G1 @28 .5 wks presenting with swelling in legs. She noticed it today around noon. States both legs are swollen and right leg is painful. 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 Denies changes in skin color. Reports mild occipital HA around 1230, did not take anything. Denies visual disturbances and epigastric pain. Rates pain 1/10. Reports good FM. No pregnancy complaints. She was seen in MAU yesterday for LBP and found to have a few elevated BPs, labs were normal.   OB History    Gravida  1   Para      Term      Preterm      AB      Living        SAB      TAB      Ectopic      Multiple      Live Births              Past Medical History:  Diagnosis Date  . Anemia   . Hydradenitis   . Pregnancy induced hypertension     Past Surgical History:  Procedure Laterality Date  . KNEE ARTHROSCOPY Right     Family History  Problem Relation Age of Onset  . Hypertension Mother   . Seizures Father   . Hypertension Father     Social History   Tobacco Use  . Smoking status: Never Smoker  . Smokeless tobacco: Never Used  Substance Use Topics  . Alcohol use: No  . Drug use: No    Allergies:  Allergies  Allergen Reactions  . Ceclor [Cefaclor] Hives  . Garlic Hives  . Penicillins Hives    Has patient had a PCN reaction causing immediate rash, facial/tongue/throat swelling, SOB or lightheadedness with hypotension: Yes Has patient had a PCN reaction causing severe rash involving mucus membranes or skin necrosis: Yes Has patient had a PCN reaction that required hospitalization: No Has patient had a PCN reaction occurring within the last 10 years: No If all of the above answers are "NO", then may  proceed with Cephalosporin use.   . Shellfish Allergy Itching  . Sulfamethoxazole Nausea And Vomiting  . Sulfur Hives  . Amoxicillin-Pot Clavulanate Rash    Medications Prior to Admission  Medication Sig Dispense Refill Last Dose  . aspirin EC 81 MG tablet Take 1 tablet (81 mg total) by mouth daily. Take after 12 weeks for prevention of preeclampsia later in pregnancy 300 tablet 2 05/17/2019 at 2200  . Calcium Carbonate Antacid (TUMS E-X PO) Take by mouth.   Past Month at Unknown time  . doxylamine, Sleep, (UNISOM) 25 MG tablet Take 1 tablet (25 mg total) by mouth at bedtime as needed. 30 tablet 0 Past Month at Unknown time  . Prenatal Vit-Fe Fumarate-FA (MULTIVITAMIN-PRENATAL) 27-0.8 MG TABS tablet Take 2 tablets by mouth daily.    05/17/2019 at 0600  . cyclobenzaprine (FLEXERIL) 10 MG tablet Take 1 tablet (10 mg total) by mouth 3 (three) times daily as needed (back pain). 20 tablet 1   . Elastic Bandages & Supports (COMFORT FIT MATERNITY SUPP LG) MISC 1 application by Does not apply route daily. 1 each 0   .  EPINEPHrine 0.3 mg/0.3 mL IJ SOAJ injection Inject 0.3 mg into the muscle as needed for anaphylaxis.       Review of Systems  Respiratory: Negative for shortness of breath.   Cardiovascular: Positive for leg swelling. Negative for chest pain.  Gastrointestinal: Negative for abdominal pain.  Genitourinary: Negative for vaginal bleeding.   Physical Exam   Blood pressure (!) 110/53, pulse 68, temperature 98.1 F (36.7 C), temperature source Oral, resp. rate 16, height 5\' 6"  (1.676 m), weight (!) 145.5 kg, last menstrual period 10/29/2018, SpO2 99 %.  Patient Vitals for the past 24 hrs:  BP Temp Temp src Pulse Resp SpO2 Height Weight  05/18/19 1431 (!) 110/53 -- -- 68 -- -- -- --  05/18/19 1416 (!) 121/58 -- -- 81 -- -- -- --  05/18/19 1401 (!) 106/51 -- -- 76 -- -- -- --  05/18/19 1346 (!) 106/49 -- -- 77 -- -- -- --  05/18/19 1330 117/71 -- -- 98 -- -- -- --  05/18/19 1328 120/66  -- -- 80 -- -- -- --  05/18/19 1306 (!) 135/59 98.1 F (36.7 C) Oral 83 16 99 % 5\' 6"  (1.676 m) (!) 145.5 kg   Physical Exam  Nursing note and vitals reviewed. Constitutional: She is oriented to person, place, and time. She appears well-developed and well-nourished. No distress.  HENT:  Head: Normocephalic and atraumatic.  Cardiovascular: Normal rate.  Respiratory: Effort normal. No respiratory distress.  Musculoskeletal:        General: Edema (Trace to bilateral LE) present. No tenderness or deformity. Normal range of motion.     Cervical back: Normal range of motion.     Comments: Neg homans  Neurological: She is alert and oriented to person, place, and time. She displays normal reflexes.  Skin: Skin is warm and dry.  Psychiatric: She has a normal mood and affect.  EFM: 135 bpm, mod variability, + accels, no decels Toco: none  Results for orders placed or performed during the hospital encounter of 05/18/19 (from the past 24 hour(s))  Urinalysis, Routine w reflex microscopic     Status: Abnormal   Collection Time: 05/18/19  1:27 PM  Result Value Ref Range   Color, Urine YELLOW YELLOW   APPearance HAZY (A) CLEAR   Specific Gravity, Urine 1.015 1.005 - 1.030   pH 7.0 5.0 - 8.0   Glucose, UA NEGATIVE NEGATIVE mg/dL   Hgb urine dipstick NEGATIVE NEGATIVE   Bilirubin Urine NEGATIVE NEGATIVE   Ketones, ur NEGATIVE NEGATIVE mg/dL   Protein, ur NEGATIVE NEGATIVE mg/dL   Nitrite NEGATIVE NEGATIVE   Leukocytes,Ua NEGATIVE NEGATIVE  Protein / creatinine ratio, urine     Status: None   Collection Time: 05/18/19  1:27 PM  Result Value Ref Range   Creatinine, Urine 95.08 mg/dL   Total Protein, Urine 9 mg/dL   Protein Creatinine Ratio 0.09 0.00 - 0.15 mg/mg[Cre]  CBC     Status: Abnormal   Collection Time: 05/18/19  1:32 PM  Result Value Ref Range   WBC 6.7 4.0 - 10.5 K/uL   RBC 3.67 (L) 3.87 - 5.11 MIL/uL   Hemoglobin 11.1 (L) 12.0 - 15.0 g/dL   HCT 07/16/19 (L) 07/16/19 - 62.3 %   MCV  90.7 80.0 - 100.0 fL   MCH 30.2 26.0 - 34.0 pg   MCHC 33.3 30.0 - 36.0 g/dL   RDW 76.2 83.1 - 51.7 %   Platelets 422 (H) 150 - 400 K/uL   nRBC 0.0 0.0 -  0.2 %  Comprehensive metabolic panel     Status: Abnormal   Collection Time: 05/18/19  1:32 PM  Result Value Ref Range   Sodium 136 135 - 145 mmol/L   Potassium 4.0 3.5 - 5.1 mmol/L   Chloride 104 98 - 111 mmol/L   CO2 22 22 - 32 mmol/L   Glucose, Bld 83 70 - 99 mg/dL   BUN 5 (L) 6 - 20 mg/dL   Creatinine, Ser 7.41 0.44 - 1.00 mg/dL   Calcium 9.0 8.9 - 63.8 mg/dL   Total Protein 6.4 (L) 6.5 - 8.1 g/dL   Albumin 2.9 (L) 3.5 - 5.0 g/dL   AST 16 15 - 41 U/L   ALT 13 0 - 44 U/L   Alkaline Phosphatase 65 38 - 126 U/L   Total Bilirubin 0.2 (L) 0.3 - 1.2 mg/dL   GFR calc non Af Amer >60 >60 mL/min   GFR calc Af Amer >60 >60 mL/min   Anion gap 10 5 - 15   MAU Course  Procedures  MDM Labs ordered and reviewed. Mild edema noted, likely dependent d/t sitting, dicussed treatment measures, no signs of DVT. No evidence of PEC. Stable for discharge home.   Assessment and Plan   1. [redacted] weeks gestation of pregnancy   2. NST (non-stress test) reactive   3. Dependent edema    Discharge home Follow up at [facility] as scheduled Elevate feet and legs, compression stockings prn Return precautions  Allergies as of 05/18/2019      Reactions   Ceclor [cefaclor] Hives   Garlic Hives   Penicillins Hives   Has patient had a PCN reaction causing immediate rash, facial/tongue/throat swelling, SOB or lightheadedness with hypotension: Yes Has patient had a PCN reaction causing severe rash involving mucus membranes or skin necrosis: Yes Has patient had a PCN reaction that required hospitalization: No Has patient had a PCN reaction occurring within the last 10 years: No If all of the above answers are "NO", then may proceed with Cephalosporin use.   Shellfish Allergy Itching   Sulfamethoxazole Nausea And Vomiting   Sulfur Hives   Amoxicillin-pot  Clavulanate Rash      Medication List    TAKE these medications   aspirin EC 81 MG tablet Take 1 tablet (81 mg total) by mouth daily. Take after 12 weeks for prevention of preeclampsia later in pregnancy   Comfort Fit Maternity Supp Lg Misc 1 application by Does not apply route daily.   cyclobenzaprine 10 MG tablet Commonly known as: FLEXERIL Take 1 tablet (10 mg total) by mouth 3 (three) times daily as needed (back pain).   doxylamine (Sleep) 25 MG tablet Commonly known as: UNISOM Take 1 tablet (25 mg total) by mouth at bedtime as needed.   EPINEPHrine 0.3 mg/0.3 mL Soaj injection Commonly known as: EPI-PEN Inject 0.3 mg into the muscle as needed for anaphylaxis.   multivitamin-prenatal 27-0.8 MG Tabs tablet Take 2 tablets by mouth daily.   TUMS E-X PO Take by mouth.      Donette Larry, CNM 05/18/2019, 2:32 PM

## 2019-05-18 NOTE — MAU Note (Signed)
"  swollen legs and feet".  Noted at 1200, wasn't like that when she got up this morning.  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  Slight HA, has not taken anything; denies epigastric pain or visual changes.

## 2019-05-18 NOTE — Discharge Instructions (Signed)
Edema  Edema is when you have too much fluid in your body or under your skin. Edema may make your legs, feet, and ankles swell up. Swelling is also common in looser tissues, like around your eyes. This is a common condition. It gets more common as you get older. There are many possible causes of edema. Eating too much salt (sodium) and being on your feet or sitting for a long time can cause edema in your legs, feet, and ankles. Hot weather may make edema worse. Edema is usually painless. Your skin may look swollen or shiny. Follow these instructions at home:  Keep the swollen body part raised (elevated) above the level of your heart when you are sitting or lying down.  Do not sit still or stand for a long time.  Do not wear tight clothes. Do not wear garters on your upper legs.  Exercise your legs. This can help the swelling go down.  Wear elastic bandages or support stockings as told by your doctor.  Eat a low-salt (low-sodium) diet to reduce fluid as told by your doctor.  Depending on the cause of your swelling, you may need to limit how much fluid you drink (fluid restriction).  Take over-the-counter and prescription medicines only as told by your doctor. Contact a doctor if:  Treatment is not working.  You have heart, liver, or kidney disease and have symptoms of edema.  You have sudden and unexplained weight gain. Get help right away if:  You have shortness of breath or chest pain.  You cannot breathe when you lie down.  You have pain, redness, or warmth in the swollen areas.  You have heart, liver, or kidney disease and get edema all of a sudden.  You have a fever and your symptoms get worse all of a sudden. Summary  Edema is when you have too much fluid in your body or under your skin.  Edema may make your legs, feet, and ankles swell up. Swelling is also common in looser tissues, like around your eyes.  Raise (elevate) the swollen body part above the level of your  heart when you are sitting or lying down.  Follow your doctor's instructions about diet and how much fluid you can drink (fluid restriction). This information is not intended to replace advice given to you by your health care provider. Make sure you discuss any questions you have with your health care provider. Document Revised: 04/29/2017 Document Reviewed: 05/14/2016 Elsevier Patient Education  2020 [employer]  

## 2019-05-21 ENCOUNTER — Encounter: Payer: Self-pay | Admitting: *Deleted

## 2019-05-24 ENCOUNTER — Other Ambulatory Visit: Payer: Self-pay

## 2019-05-24 ENCOUNTER — Encounter (HOSPITAL_COMMUNITY): Payer: Self-pay | Admitting: Obstetrics and Gynecology

## 2019-05-24 ENCOUNTER — Inpatient Hospital Stay (HOSPITAL_COMMUNITY)
Admission: AD | Admit: 2019-05-24 | Discharge: 2019-05-24 | Disposition: A | Discharge status: Home / Self Care | Payer: BC Managed Care – PPO | Attending: Obstetrics and Gynecology | Admitting: Obstetrics and Gynecology

## 2019-05-24 DIAGNOSIS — Z3A29 29 weeks gestation of pregnancy: Secondary | ICD-10-CM | POA: Diagnosis not present

## 2019-05-24 DIAGNOSIS — O99013 Anemia complicating pregnancy, third trimester: Secondary | ICD-10-CM | POA: Diagnosis not present

## 2019-05-24 DIAGNOSIS — K5900 Constipation, unspecified: Secondary | ICD-10-CM | POA: Insufficient documentation

## 2019-05-24 DIAGNOSIS — O99613 Diseases of the digestive system complicating pregnancy, third trimester: Secondary | ICD-10-CM | POA: Insufficient documentation

## 2019-05-24 DIAGNOSIS — N949 Unspecified condition associated with female genital organs and menstrual cycle: Secondary | ICD-10-CM | POA: Diagnosis not present

## 2019-05-24 DIAGNOSIS — O26893 Other specified pregnancy related conditions, third trimester: Secondary | ICD-10-CM

## 2019-05-24 DIAGNOSIS — O99213 Obesity complicating pregnancy, third trimester: Secondary | ICD-10-CM | POA: Diagnosis not present

## 2019-05-24 DIAGNOSIS — Z7982 Long term (current) use of aspirin: Secondary | ICD-10-CM | POA: Insufficient documentation

## 2019-05-24 LAB — URINALYSIS, ROUTINE W REFLEX MICROSCOPIC
Bilirubin Urine: NEGATIVE
Glucose, UA: NEGATIVE mg/dL
Hgb urine dipstick: NEGATIVE
Ketones, ur: 5 mg/dL — AB
Leukocytes,Ua: NEGATIVE
Nitrite: NEGATIVE
Protein, ur: NEGATIVE mg/dL
Specific Gravity, Urine: 1.021 (ref 1.005–1.030)
pH: 6 (ref 5.0–8.0)

## 2019-05-24 LAB — WET PREP, GENITAL
Clue Cells Wet Prep HPF POC: NONE SEEN
Sperm: NONE SEEN
Trich, Wet Prep: NONE SEEN
Yeast Wet Prep HPF POC: NONE SEEN

## 2019-05-24 LAB — FETAL FIBRONECTIN: Fetal Fibronectin: NEGATIVE

## 2019-05-24 NOTE — Discharge Instructions (Signed)

## 2019-05-24 NOTE — MAU Note (Signed)
Pt reports continuous abdominal cramping since 0740 this morning, pain about 5-6. Denies LOF,VB, +FM

## 2019-05-24 NOTE — MAU Provider Note (Addendum)
History     CSN: [id number]  Arrival date and time: 05/24/19 1258   None     Chief Complaint  Patient presents with  . Abdominal Cramping   Alexandra Collins is a 24 y.o. G1P0 at [redacted]w[redacted]d presenting with a 1-day Hx of intermittent lower abdominal cramping.  Patient reports lower abdominal cramping that started today 1/14 at 0745. The pain has waxed and waned from a 5/10 to a 2/10. Pain is worse with standing and walking. 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 Reports mild clear vaginal discharge with an "ammonia odor." Denies vaginal pain, dysuria. Denies typical nausea, though she says she throws up when she gets hungry since the 2nd trimester. Patient denies vaginal bleeding, diarrhea. Reports mild constipation with a feeling of incomplete BM, still has BMs twice a day that are soft.  PMH: Morbid obesity, hydradenitis suppurativa (takes Doxycycline but hold during pregnancy)  OB History     Gravida  1   Para      Term      Preterm      AB      Living         SAB      TAB      Ectopic      Multiple      Live Births              Past Medical History:  Diagnosis Date  . Anemia   . Hydradenitis   . Pregnancy induced hypertension     Past Surgical History:  Procedure Laterality Date  . KNEE ARTHROSCOPY Right     Family History  Problem Relation Age of Onset  . Hypertension Mother   . Seizures Father   . Hypertension Father     Social History   Tobacco Use  . Smoking status: Never Smoker  . Smokeless tobacco: Never Used  Substance Use Topics  . Alcohol use: No  . Drug use: No    Allergies:  Allergies  Allergen Reactions  . Ceclor [Cefaclor] Hives  . Garlic Hives  . Penicillins Hives    Has patient had a PCN reaction causing immediate rash, facial/tongue/throat swelling, SOB or lightheadedness with hypotension: Yes Has patient had a PCN reaction causing severe  rash involving mucus membranes or skin necrosis: Yes Has patient had a PCN reaction that required hospitalization: No Has patient had a PCN reaction occurring within the last 10 years: No If all of the above answers are "NO", then may proceed with Cephalosporin use.   . Shellfish Allergy Itching  . Sulfamethoxazole Nausea And Vomiting  . Sulfur Hives  . Amoxicillin-Pot Clavulanate Rash    Medications Prior to Admission  Medication Sig Dispense Refill Last Dose  . Prenatal Vit-Fe Fumarate-FA (MULTIVITAMIN-PRENATAL) 27-0.8 MG TABS tablet Take 2 tablets by mouth daily.    05/24/2019 at Unknown time  . aspirin EC 81 MG tablet Take 1 tablet (81 mg total) by mouth daily. Take after 12 weeks for prevention of preeclampsia later in pregnancy 300 tablet 2   . Calcium Carbonate Antacid (TUMS E-X PO) Take by mouth.     . cyclobenzaprine (FLEXERIL) 10 MG tablet Take 1 tablet (10 mg total) by mouth 3 (three) times daily as needed (back pain). 20 tablet 1   . doxylamine, Sleep, (UNISOM) 25 MG tablet Take 1 tablet (25 mg total) by mouth at bedtime as needed. 30 tablet 0   .  Elastic Bandages & Supports (COMFORT FIT MATERNITY SUPP LG) MISC 1 application by Does not apply route daily. 1 each 0   . EPINEPHrine 0.3 mg/0.3 mL IJ SOAJ injection Inject 0.3 mg into the muscle as needed for anaphylaxis.       Review of Systems  Constitutional: Negative for appetite change.  Gastrointestinal: Positive for abdominal pain, constipation and vomiting. Negative for diarrhea and nausea.  Genitourinary: Positive for vaginal discharge. Negative for vaginal bleeding and vaginal pain.   Physical Exam   Blood pressure 122/64, pulse (!) 105, temperature 98.5 F (36.9 C), temperature source Oral, resp. rate 16, height 5\' 6"  (1.676 m), weight (!) 144.8 kg, last menstrual period 10/29/2018, SpO2 99 %.  Physical Exam  Constitutional: She appears well-developed and well-nourished.  HENT:  Head: Normocephalic and atraumatic.   Eyes: EOM are normal. Right eye exhibits no discharge. Left eye exhibits no discharge. No scleral icterus.  Cardiovascular: Normal rate, regular rhythm and normal heart sounds. Exam reveals no gallop and no friction rub.  No murmur heard. Respiratory: Effort normal and breath sounds normal. No respiratory distress. She has no wheezes. She has no rales. She exhibits no tenderness.  GI: Soft. Bowel sounds are normal. There is no abdominal tenderness. There is no rebound and no guarding.  Normal Gravid uterus  Psychiatric: She has a normal mood and affect. Her behavior is normal.    MAU Course    MDM CBC, UA, wet prep, and GC/Chlamydia to rule out UTI or STI. Fetal Fibronectin to assess risk of preterm labor.  Assessment and Plan  Alexandra Collins is a 24 y.o. G1P0 at [redacted]w[redacted]d presenting with a 1-day Hx of intermittent lower abdominal cramping.  The positional aspect of her pain, worsening with standing and walking, in addition to her obese body habitus, strongly suggests round ligament pain. Her normal BP, lack of vaginal bleeding make an intrauterine pathology such of placental abruption or subchorionic hemorrhage unlikely. GC/Chlaymydia swab was pending at discharge. Wet prep was normal. Fetal fibronectin was negative, decreasing risk of preterm labor. UA was normal making UTI unlikely.  Patient given information on round ligament exercises. She declined Flexural as it has previously made her very tired.  Patient does not currently have an upcoming prenatal care appointment and needs to be scheduled for one to receive her 2-hour GTT.  Alexandra Collins 05/24/2019, 2:20 PM    I confirm that I have verified the information documented in the medical student's note and that I have also personally reperformed the history, physical exam and all medical decision making activities of this service and have verified that all service and findings are accurately documented in this student's note.  -wet  prep normal -UA normal -GC GC pending -NST: 135 bpm, mod var, present acel, neg decels, no contractions.  -Cervix is closed, long and thick; patient was not rechecked as she had no contractions while in MAU.  -Low index of suspicion for acute abdomen or PTL; cervix is long, closed and thick and patient is resting comfortably. Pain is more consistent with RLQ than PTL, appendicitis, etc.    Starr Lake, CNM 05/24/2019 4:35 PM

## 2019-05-25 LAB — GC/CHLAMYDIA PROBE AMP (~~LOC~~) NOT AT ARMC
Chlamydia: NEGATIVE
Comment: NEGATIVE
Comment: NORMAL
Neisseria Gonorrhea: NEGATIVE

## 2019-05-28 ENCOUNTER — Encounter (HOSPITAL_COMMUNITY): Payer: Self-pay | Admitting: *Deleted

## 2019-05-28 ENCOUNTER — Other Ambulatory Visit (HOSPITAL_COMMUNITY): Payer: Self-pay | Admitting: *Deleted

## 2019-05-28 ENCOUNTER — Ambulatory Visit (HOSPITAL_COMMUNITY)
Admission: RE | Admit: 2019-05-28 | Discharge: 2019-05-28 | Disposition: A | Discharge status: Home / Self Care | Payer: BC Managed Care – PPO | Source: Ambulatory Visit | Attending: Obstetrics | Admitting: Obstetrics

## 2019-05-28 ENCOUNTER — Ambulatory Visit (INDEPENDENT_AMBULATORY_CARE_PROVIDER_SITE_OTHER): Payer: BC Managed Care – PPO | Admitting: Family Medicine

## 2019-05-28 ENCOUNTER — Other Ambulatory Visit: Payer: BC Managed Care – PPO

## 2019-05-28 ENCOUNTER — Other Ambulatory Visit: Payer: Self-pay

## 2019-05-28 ENCOUNTER — Ambulatory Visit (HOSPITAL_COMMUNITY): Payer: BC Managed Care – PPO | Admitting: *Deleted

## 2019-05-28 VITALS — BP 133/64 | HR 97 | Temp 97.1°F

## 2019-05-28 VITALS — BP 119/79 | HR 94 | Wt 319.9 lb

## 2019-05-28 DIAGNOSIS — Z23 Encounter for immunization: Secondary | ICD-10-CM

## 2019-05-28 DIAGNOSIS — Z362 Encounter for other antenatal screening follow-up: Secondary | ICD-10-CM | POA: Diagnosis not present

## 2019-05-28 DIAGNOSIS — O0990 Supervision of high risk pregnancy, unspecified, unspecified trimester: Secondary | ICD-10-CM

## 2019-05-28 DIAGNOSIS — Z6841 Body Mass Index (BMI) 40.0 and over, adult: Secondary | ICD-10-CM | POA: Diagnosis present

## 2019-05-28 DIAGNOSIS — O99013 Anemia complicating pregnancy, third trimester: Secondary | ICD-10-CM | POA: Diagnosis not present

## 2019-05-28 DIAGNOSIS — O163 Unspecified maternal hypertension, third trimester: Secondary | ICD-10-CM

## 2019-05-28 DIAGNOSIS — O0993 Supervision of high risk pregnancy, unspecified, third trimester: Secondary | ICD-10-CM

## 2019-05-28 DIAGNOSIS — O99213 Obesity complicating pregnancy, third trimester: Secondary | ICD-10-CM | POA: Insufficient documentation

## 2019-05-28 DIAGNOSIS — Z3A30 30 weeks gestation of pregnancy: Secondary | ICD-10-CM

## 2019-05-28 NOTE — Progress Notes (Signed)
   PRENATAL VISIT NOTE  Subjective:  Alexandra Collins is a 24 y.o. G1P0 at [redacted]w[redacted]d being seen today for ongoing prenatal care.  She is currently monitored for the following issues for this high-risk pregnancy and has Pregnancy; Maternal morbid obesity, antepartum (HCC); Supervision of high-risk pregnancy; and Hypertension in pregnancy on their problem list.  Patient reports occasional contractions. Intermittent dizziness.  Contractions: Not present. Vag. Bleeding: None.  Movement: Present. Denies leaking of fluid.   The following portions of the patient's history were reviewed and updated as appropriate: allergies, current medications, past family history, past medical history, past social history, past surgical history and problem list.   Objective:   Vitals:   05/28/19 0915  BP: 119/79  Pulse: 94  Weight: (!) 319 lb 14.4 oz (145.1 kg)    Fetal Status: Fetal Heart Rate (bpm): 156   Movement: Present     General:  Alert, oriented and cooperative. Patient is in no acute distress.  Skin: Skin is warm and dry. No rash noted.   Cardiovascular: Normal heart rate noted  Respiratory: Normal respiratory effort, no problems with respiration noted  Abdomen: Soft, gravid, appropriate for gestational age.  Pain/Pressure: Absent     Pelvic: Cervical exam deferred        Extremities: Normal range of motion.  Edema: Trace  Mental Status: Normal mood and affect. Normal behavior. Normal judgment and thought content.   Assessment and Plan:  Pregnancy: G1P0 at [redacted]w[redacted]d 1. Supervision of high risk pregnancy, antepartum FHT and FH normal. 28 week labs today.  2. Hypertension during pregnancy in third trimester, unspecified hypertension in pregnancy type Had spuriously elevated BP in MAU. BP during first trimester 130s/70s. On ASA for increased preeclampsia/GHTN  Preterm labor symptoms and general obstetric precautions including but not limited to vaginal bleeding, contractions, leaking of fluid and fetal  movement were reviewed in detail with the patient. Please refer to After Visit Summary for other counseling recommendations.   No follow-ups on file.  Future Appointments  Date Time Provider Department Center  05/28/2019 10:15 AM Noralee Chars [facility]  05/28/2019 11:30 AM [facility] NURSE [facility]  05/28/2019 11:30 AM [facility] [place] [facility]    Levie Heritage, DO

## 2019-05-29 LAB — CBC
Hematocrit: 32.3 % — ABNORMAL LOW (ref 34.0–46.6)
Hemoglobin: 10.8 g/dL — ABNORMAL LOW (ref 11.1–15.9)
MCH: 30 pg (ref 26.6–33.0)
MCHC: 33.4 g/dL (ref 31.5–35.7)
MCV: 90 fL (ref 79–97)
Platelets: 414 10*3/uL (ref 150–450)
RBC: 3.6 x10E6/uL — ABNORMAL LOW (ref 3.77–5.28)
RDW: 12.8 % (ref 11.7–15.4)
WBC: 6 10*3/uL (ref 3.4–10.8)

## 2019-05-29 LAB — RPR: RPR Ser Ql: NONREACTIVE

## 2019-05-29 LAB — GLUCOSE TOLERANCE, 2 HOURS W/ 1HR
Glucose, 1 hour: 87 mg/dL (ref 65–179)
Glucose, 2 hour: 102 mg/dL (ref 65–152)
Glucose, Fasting: 76 mg/dL (ref 65–91)

## 2019-05-29 LAB — HIV ANTIBODY (ROUTINE TESTING W REFLEX): HIV Screen 4th Generation wRfx: NONREACTIVE

## 2019-06-11 ENCOUNTER — Telehealth (INDEPENDENT_AMBULATORY_CARE_PROVIDER_SITE_OTHER): Payer: BC Managed Care – PPO | Admitting: Family Medicine

## 2019-06-11 ENCOUNTER — Encounter (HOSPITAL_COMMUNITY): Payer: Self-pay | Admitting: Obstetrics and Gynecology

## 2019-06-11 ENCOUNTER — Other Ambulatory Visit: Payer: Self-pay

## 2019-06-11 ENCOUNTER — Inpatient Hospital Stay (HOSPITAL_COMMUNITY)
Admission: AD | Admit: 2019-06-11 | Discharge: 2019-06-11 | Disposition: A | Discharge status: Home / Self Care | Payer: BC Managed Care – PPO | Attending: Obstetrics and Gynecology | Admitting: Obstetrics and Gynecology

## 2019-06-11 VITALS — BP 142/81 | HR 79

## 2019-06-11 DIAGNOSIS — O26893 Other specified pregnancy related conditions, third trimester: Secondary | ICD-10-CM | POA: Insufficient documentation

## 2019-06-11 DIAGNOSIS — Z3A32 32 weeks gestation of pregnancy: Secondary | ICD-10-CM

## 2019-06-11 DIAGNOSIS — O163 Unspecified maternal hypertension, third trimester: Secondary | ICD-10-CM

## 2019-06-11 DIAGNOSIS — R0789 Other chest pain: Secondary | ICD-10-CM | POA: Insufficient documentation

## 2019-06-11 DIAGNOSIS — O0993 Supervision of high risk pregnancy, unspecified, third trimester: Secondary | ICD-10-CM

## 2019-06-11 DIAGNOSIS — R519 Headache, unspecified: Secondary | ICD-10-CM

## 2019-06-11 DIAGNOSIS — Z880 Allergy status to penicillin: Secondary | ICD-10-CM | POA: Insufficient documentation

## 2019-06-11 DIAGNOSIS — R030 Elevated blood-pressure reading, without diagnosis of hypertension: Secondary | ICD-10-CM | POA: Diagnosis present

## 2019-06-11 DIAGNOSIS — Z3689 Encounter for other specified antenatal screening: Secondary | ICD-10-CM

## 2019-06-11 LAB — PROTEIN / CREATININE RATIO, URINE
Creatinine, Urine: 138.12 mg/dL
Protein Creatinine Ratio: 0.1 mg/mg{Cre} (ref 0.00–0.15)
Total Protein, Urine: 14 mg/dL

## 2019-06-11 LAB — CBC
HCT: 30.7 % — ABNORMAL LOW (ref 36.0–46.0)
Hemoglobin: 10.4 g/dL — ABNORMAL LOW (ref 12.0–15.0)
MCH: 29.6 pg (ref 26.0–34.0)
MCHC: 33.9 g/dL (ref 30.0–36.0)
MCV: 87.5 fL (ref 80.0–100.0)
Platelets: 396 10*3/uL (ref 150–400)
RBC: 3.51 MIL/uL — ABNORMAL LOW (ref 3.87–5.11)
RDW: 12.4 % (ref 11.5–15.5)
WBC: 6.1 10*3/uL (ref 4.0–10.5)
nRBC: 0 % (ref 0.0–0.2)

## 2019-06-11 LAB — COMPREHENSIVE METABOLIC PANEL
ALT: 13 U/L (ref 0–44)
AST: 17 U/L (ref 15–41)
Albumin: 2.4 g/dL — ABNORMAL LOW (ref 3.5–5.0)
Alkaline Phosphatase: 74 U/L (ref 38–126)
Anion gap: 9 (ref 5–15)
BUN: 5 mg/dL — ABNORMAL LOW (ref 6–20)
CO2: 20 mmol/L — ABNORMAL LOW (ref 22–32)
Calcium: 8.6 mg/dL — ABNORMAL LOW (ref 8.9–10.3)
Chloride: 108 mmol/L (ref 98–111)
Creatinine, Ser: 0.7 mg/dL (ref 0.44–1.00)
GFR calc Af Amer: >60 mL/min (ref >60)
GFR calc non Af Amer: >60 mL/min (ref >60)
Glucose, Bld: 101 mg/dL — ABNORMAL HIGH (ref 70–99)
Potassium: 3.6 mmol/L (ref 3.5–5.1)
Sodium: 137 mmol/L (ref 135–145)
Total Bilirubin: 0.2 mg/dL — ABNORMAL LOW (ref 0.3–1.2)
Total Protein: 6 g/dL — ABNORMAL LOW (ref 6.5–8.1)

## 2019-06-11 MED ORDER — ALUM & MAG HYDROXIDE-SIMETH 200-200-20 MG/5ML PO SUSP
30.0000 mL | Freq: Once | ORAL | Status: AC
  Administered 2019-06-11: 30 mL via ORAL
  Filled 2019-06-11: qty 30

## 2019-06-11 MED ORDER — ACETAMINOPHEN 500 MG PO TABS
1000.0000 mg | ORAL_TABLET | Freq: Once | ORAL | Status: AC
  Administered 2019-06-11: 08:00:00 1000 mg via ORAL
  Filled 2019-06-11: qty 2

## 2019-06-11 NOTE — MAU Provider Note (Signed)
History     CSN: [id number]  Arrival date and time: 06/11/19 4098   First Provider Initiated Contact with Patient 06/11/19 0755     Chief Complaint  Patient presents with  . Hypertension  . Headache   HPI Alexandra Collins is a 24 y.o. G1P0 at [redacted]w[redacted]d who presents to MAU with chief complaint of elevated blood pressure on her home cuff and new onset headache in setting of previous intermittently elevated blood pressures and history of headaches. She denies visual disturbances, RUQ/epigastric pain, new onset swelling or weight gain, weakness or syncope. She also denies vaginal bleeding, leaking of fluid, decreased fetal movement, fever, falls, or recent illness.   Elevated Blood Pressure Patient endorses new onset elevated pressure on her home cuff this morning. She cannot remember the readings. She endorses history of occasional elevated blood pressures but reports being predominantly normotensive.  Headache This is a recurrent problem, current headache started at 0500 today. Her headache is bilateral, anterior, non-radiating, rated as 6/10 in MAU triage, rated as 4/10 upon CNM initial assessment with no intervention at that point. Patient endorses history of headaches which resolve with rest. She reports "maybe two" migraines without aura in the past two years. She has not taken medication or tried other treatments for this complaint. She drove herself to the hospital and prefers Tylenol for initial intervention in MAU.  Chest Discomfort This is a new problem, onset when patient was placed in low Fowler's in MAU exam room. Her discomfort is superior to her nipple line in the middle of her chest. She denies history of cardiac concerns, palpitations SOB, lightheadedness. She denies history of reflux or indigestion.   Patient ate two mini [employer] sausage breakfast sandwiches around 0630.  She receives prenatal care with [facility] and her next appointment is this afternoon.  OB History     Gravida  1   Para      Term      Preterm      AB      Living        SAB      TAB      Ectopic      Multiple      Live Births              Past Medical History:  Diagnosis Date  . Anemia   . Hydradenitis   . Pregnancy induced hypertension     Past Surgical History:  Procedure Laterality Date  . KNEE ARTHROSCOPY Right     Family History  Problem Relation Age of Onset  . Hypertension Mother   . Seizures Father   . Hypertension Father     Social History   Tobacco Use  . Smoking status: Never Smoker  . Smokeless tobacco: Never Used  Substance Use Topics  . Alcohol use: No  . Drug use: No    Allergies:  Allergies  Allergen Reactions  . Ceclor [Cefaclor] Hives  . Garlic Hives  . Penicillins Hives    Has patient had a PCN reaction causing immediate rash, facial/tongue/throat swelling, SOB or lightheadedness with hypotension: Yes Has patient had a PCN reaction causing severe rash involving mucus membranes or skin necrosis: Yes Has patient had a PCN reaction that required hospitalization: No Has patient had a PCN reaction occurring within the last 10 years: No If all of the above answers are "NO", then may proceed with Cephalosporin use.   . Shellfish Allergy Itching  . Sulfamethoxazole Nausea And Vomiting  .  Sulfur Hives  . Amoxicillin-Pot Clavulanate Rash    Medications Prior to Admission  Medication Sig Dispense Refill Last Dose  . aspirin EC 81 MG tablet Take 1 tablet (81 mg total) by mouth daily. Take after 12 weeks for prevention of preeclampsia later in pregnancy 300 tablet 2   . Calcium Carbonate Antacid (TUMS E-X PO) Take by mouth.     . cyclobenzaprine (FLEXERIL) 10 MG tablet Take 1 tablet (10 mg total) by mouth 3 (three) times daily as needed (back pain). 20 tablet 1   . doxylamine, Sleep, (UNISOM) 25 MG tablet Take 1 tablet (25 mg total) by mouth at bedtime as needed. 30 tablet 0   . Elastic Bandages & Supports (COMFORT FIT MATERNITY  SUPP LG) MISC 1 application by Does not apply route daily. 1 each 0   . EPINEPHrine 0.3 mg/0.3 mL IJ SOAJ injection Inject 0.3 mg into the muscle as needed for anaphylaxis.     . Prenatal Vit-Fe Fumarate-FA (MULTIVITAMIN-PRENATAL) 27-0.8 MG TABS tablet Take 2 tablets by mouth daily.        Review of Systems  Constitutional: Negative for chills, fatigue and fever.  Eyes: Negative for photophobia and visual disturbance.  Respiratory: Negative for shortness of breath.   Cardiovascular: Negative for palpitations.  Gastrointestinal: Negative for abdominal pain, nausea and vomiting.  Genitourinary: Negative for dysuria, vaginal bleeding, vaginal discharge and vaginal pain.  Musculoskeletal: Negative for back pain.  Neurological: Positive for headaches.  All other systems reviewed and are negative.  Physical Exam   Blood pressure 124/72, pulse (!) 108, temperature 98 F (36.7 C), temperature source Oral, resp. rate 19, height 5\' 6"  (1.676 m), weight (!) 145.7 kg, last menstrual period 10/29/2018.  Physical Exam  Nursing note and vitals reviewed. Constitutional: She is oriented to person, place, and time. She appears well-developed and well-nourished.  Cardiovascular: Normal rate.  Respiratory: Effort normal and breath sounds normal. No respiratory distress.  GI: There is no abdominal tenderness. There is no CVA tenderness.  Gravid  Genitourinary:    Genitourinary Comments: Deferred due to chief complaint, ROS   Neurological: She is alert and oriented to person, place, and time.  Skin: Skin is warm and dry.  Psychiatric: She has a normal mood and affect. Her behavior is normal. Judgment and thought content normal.    MAU Course/MDM  Procedures  --Prenatal records reviewed. Patient normotensive in first trimester --Chest discomfort resolved with repositioning to High Fowler's and GI Cocktail --1G Tylenol administered at 0829. Patient sleeping at 0855 --Reactive tracing: baseline 125,  moderate variability, positive accels, no decels --Toco: occasional UI --See RN flowsheet notes regarding inaccurate readings and BP cuff evaluation (at [place], 0917 and 0926)  Orders Placed This Encounter  Procedures  . Protein / creatinine ratio, urine  . CBC  . Comprehensive metabolic panel   Meds ordered this encounter  Medications  . acetaminophen (TYLENOL) tablet 1,000 mg  . alum & mag hydroxide-simeth (MAALOX/MYLANTA) 200-200-20 MG/5ML suspension 30 mL     Patient Vitals for the past 24 hrs:  BP Temp Temp src Pulse Resp Height Weight  06/11/19 0932 (!) 83/61 98.1 F (36.7 C) Oral 80 19 -- --  06/11/19 0927 (!) 105/45 -- -- 69 -- -- --  06/11/19 0926 (!) 91/47 -- -- 73 -- -- --  06/11/19 0917 (!) 74/32 -- -- 73 -- -- --  06/11/19 0908 (!) 97/35 -- -- 71 -- -- --  06/11/19 0905 (!) 96/37 -- -- 72 -- -- --  06/11/19 0900 (!) (P) 96/37 -- -- (P) 76 -- -- --  06/11/19 0845 (!) 137/100 -- -- -- -- -- --  06/11/19 0817 135/70 -- -- 98 -- -- --  06/11/19 0802 135/80 -- -- 98 -- -- --  06/11/19 0747 124/72 -- -- (!) 108 19 -- --  06/11/19 0746 -- 98 F (36.7 C) Oral -- -- -- --  06/11/19 0742 122/73 -- -- (!) 118 -- -- --  06/11/19 0723 -- -- -- -- -- 5\' 6"  (1.676 m) (!) 145.7 kg   Results for orders placed or performed during the hospital encounter of 06/11/19 (from the past 24 hour(s))  CBC     Status: Abnormal   Collection Time: 06/11/19  8:02 AM  Result Value Ref Range   WBC 6.1 4.0 - 10.5 K/uL   RBC 3.51 (L) 3.87 - 5.11 MIL/uL   Hemoglobin 10.4 (L) 12.0 - 15.0 g/dL   HCT 08/09/19 (L) 40.8 - 14.4 %   MCV 87.5 80.0 - 100.0 fL   MCH 29.6 26.0 - 34.0 pg   MCHC 33.9 30.0 - 36.0 g/dL   RDW 81.8 56.3 - 14.9 %   Platelets 396 150 - 400 K/uL   nRBC 0.0 0.0 - 0.2 %  Comprehensive metabolic panel     Status: Abnormal   Collection Time: 06/11/19  8:02 AM  Result Value Ref Range   Sodium 137 135 - 145 mmol/L   Potassium 3.6 3.5 - 5.1 mmol/L   Chloride 108 98 - 111 mmol/L   CO2  20 (L) 22 - 32 mmol/L   Glucose, Bld 101 (H) 70 - 99 mg/dL   BUN 5 (L) 6 - 20 mg/dL   Creatinine, Ser 08/09/19 0.44 - 1.00 mg/dL   Calcium 8.6 (L) 8.9 - 10.3 mg/dL   Total Protein 6.0 (L) 6.5 - 8.1 g/dL   Albumin 2.4 (L) 3.5 - 5.0 g/dL   AST 17 15 - 41 U/L   ALT 13 0 - 44 U/L   Alkaline Phosphatase 74 38 - 126 U/L   Total Bilirubin 0.2 (L) 0.3 - 1.2 mg/dL   GFR calc non Af Amer >60 >60 mL/min   GFR calc Af Amer >60 >60 mL/min   Anion gap 9 5 - 15  Protein / creatinine ratio, urine     Status: None   Collection Time: 06/11/19  8:05 AM  Result Value Ref Range   Creatinine, Urine 138.12 mg/dL   Total Protein, Urine 14 mg/dL   Protein Creatinine Ratio 0.10 0.00 - 0.15 mg/mg[Cre]   Assessment and Plan  --24 y.o. G1P0 at [redacted]w[redacted]d  --Reactive tracing, not contracting --Normotensive in MAU, PEC labs normal --Headache resolved with Tylenol --Discharge home in stable condition, work note per patient request  F/U: --[facility] virtual appointment at 1415 hours today  [facility], CNM 06/11/2019, 10:14 AM

## 2019-06-11 NOTE — Progress Notes (Signed)
Pt lying on left lateral side.

## 2019-06-11 NOTE — MAU Note (Signed)
HA since 0500, has not taken anything for it.  BP was up when checked, was elevated, but doesn't remember how high.  Denies visual changes, epigastric pain or swelling at this time.

## 2019-06-11 NOTE — Discharge Instructions (Signed)

## 2019-06-11 NOTE — Progress Notes (Signed)
Cuff repositioned.  

## 2019-06-11 NOTE — Progress Notes (Signed)
Smaller cuff placed to evaluate BP change.

## 2019-06-11 NOTE — Progress Notes (Signed)
I connected with  [name] on 06/11/19 at 1400 by telephone and verified that I am speaking with the correct person using two identifiers.   I discussed the limitations, risks, security and privacy concerns of performing an evaluation and management service by telephone and the availability of in person appointments. I also discussed with the patient that there may be a patient responsible charge related to this service. The patient expressed understanding and agreed to proceed.  Marjo Bicker, RN 06/11/2019  2:00 PM

## 2019-06-11 NOTE — Progress Notes (Signed)
TELEHEALTH OBSTETRICS PRENATAL VIRTUAL VIDEO VISIT ENCOUNTER NOTE  Provider location: [facility] [place]   I connected with Alexandra Collins on 06/11/19 at  2:15 PM EST byMyChart Video Encounter at home and verified that I am speaking with the correct person using two identifiers.   I discussed the limitations, risks, security and privacy concerns of performing an evaluation and management service virtually and the availability of in person appointments. I also discussed with the patient that there may be a patient responsible charge related to this service. The patient expressed understanding and agreed to proceed. Subjective:  Alexandra Collins is a 24 y.o. G1P0 at [redacted]w[redacted]d being seen today for ongoing prenatal care.  She is currently monitored for the following issues for this low-risk pregnancy and has Pregnancy; Maternal morbid obesity, antepartum (HCC); Supervision of high-risk pregnancy; and Hypertension in pregnancy on their problem list.  Patient reports headache.  Contractions: Not present. Vag. Bleeding: None.  Movement: Present. Denies any leaking of fluid.   The following portions of the patient's history were reviewed and updated as appropriate: allergies, current medications, past family history, past medical history, past social history, past surgical history and problem list.   Objective:   Vitals:   06/11/19 1403 06/11/19 1414  BP: (!) 147/86 (!) 142/81  Pulse: 79     Fetal Status:     Movement: Present     General:  Alert, oriented and cooperative. Patient is in no acute distress.  Respiratory: Normal respiratory effort, no problems with respiration noted  Mental Status: Normal mood and affect. Normal behavior. Normal judgment and thought content.  Rest of physical exam deferred due to type of encounter  Imaging: [place] MFM OB FOLLOW UP  Result Date: 05/28/2019 ----------------------------------------------------------------------  OBSTETRICS REPORT                        (Signed Final 05/28/2019 12:41 pm) ---------------------------------------------------------------------- Patient Info  ID #:       [id number]                          D.O.B.:  19-Feb-1996 (24 yrs)  Name:       Alexandra Collins                   Visit Date: 05/28/2019 12:27 pm ---------------------------------------------------------------------- Performed By  Performed By:     Hurman Horn          Ref. Address:     [address]                    [id number]                                                             [address]                                                             Jacky Kindle  [phone]  Attending:        Noralee Spaceavi Shankar MD        [facility]  Referred By:      Levie HeritageJACOB J Desaray Marschner                    MD ---------------------------------------------------------------------- Orders   #  Description                          Code         Ordered By   1  US MFM OB FOLLOW UP                  [id number]     Rosana HoesYU FANG  ----------------------------------------------------------------------   #  Order #                    Accession #                 Episode #   1  [id number]                  [id number]                  [id number]  ---------------------------------------------------------------------- Indications   [redacted] weeks gestation of pregnancy                Z3A.30   Maternal morbid obesity (BMI 49)               O99.210 E66.01   Encounter for other antenatal screening        Z36.2   follow-up   Anemia during pregnancy in third trimester     O99.013  ---------------------------------------------------------------------- Fetal Evaluation  Num Of Fetuses:         1  Fetal Heart Rate(bpm):  122  Cardiac Activity:       Observed  Presentation:           Cephalic  Placenta:               Posterior  Amniotic Fluid  AFI FV:      Within normal limits  AFI Sum(cm)      %Tile       Largest Pocket(cm)  18.14           68          5.31  RUQ(cm)       RLQ(cm)       LUQ(cm)        LLQ(cm)  4.02          4.96          3.85           5.31 ---------------------------------------------------------------------- Biometry  BPD:      80.2  mm     G. Age:  32w 1d         92  %    CI:         79.9   %    70 - 86  FL/HC:      20.6   %    19.2 - 21.4  HC:      283.5  mm     G. Age:  31w 1d         42  %    HC/AC:      1.02        0.99 - 1.21  AC:      279.2  mm     G. Age:  32w 0d         91  %    FL/BPD:     72.8   %    71 - 87  FL:       58.4  mm     G. Age:  30w 4d         46  %    FL/AC:      20.9   %    20 - 24  HUM:      50.9  mm     G. Age:  29w 6d         46  %  Est. FW:    1771  gm    3 lb 14 oz      82  % ---------------------------------------------------------------------- OB History  Gravidity:    1 ---------------------------------------------------------------------- Gestational Age  LMP:           30w 1d        Date:  10/29/18                 EDD:   08/05/19  U/S Today:     31w 3d                                        EDD:   07/27/19  Best:          30w 1d     Det. By:  LMP  (10/29/18)          EDD:   08/05/19 ---------------------------------------------------------------------- Anatomy  Cranium:               Appears normal         LVOT:                   Appears normal  Cavum:                 Appears normal         Aortic Arch:            Appears normal  Ventricles:            Appears normal         Ductal Arch:            Not well visualized  Choroid Plexus:        Previously seen        Diaphragm:              Appears normal  Cerebellum:            Previously seen        Stomach:                Appears normal, left  sided  Posterior Fossa:       Previously seen        Abdomen:                Appears normal  Nuchal Fold:           Not applicable (>20     Abdominal Wall:         Not well visualized                         wks GA)  Face:                  Orbits and profile     Cord Vessels:           Previously seen                         previously seen  Lips:                  Previously seen        Kidneys:                Appear normal  Palate:                Not well visualized    Bladder:                Appears normal  Thoracic:              Appears normal         Spine:                  Appears normal  Heart:                 Not well visualized    Upper Extremities:      Previously seen  RVOT:                  Appears normal         Lower Extremities:      Previously seen  Other:  Technically difficult due to maternal habitus and fetal position. ---------------------------------------------------------------------- Impression  Patient returned for completion of fetal anatomy.  Fetal growth is appropriate for gestational age. Amniotic fluid  is normal and good fetal activity is seen. Fetal anatomical  evaluation is still suboptimal because of maternal obesity. ---------------------------------------------------------------------- Recommendations  -An appointment was made for her to return in 4 weeks for  fetal growth assessment. ----------------------------------------------------------------------                  Noralee Space, MD Electronically Signed Final Report   05/28/2019 12:41 pm ----------------------------------------------------------------------   Assessment and Plan:  Pregnancy: G1P0 at [redacted]w[redacted]d 1. Supervision of high risk pregnancy in third trimester Good fetal movement. Good FHT earlier today in MAU  2. Hypertension during pregnancy in third trimester, unspecified hypertension in pregnancy type Having increased BP at home, but normal in MAU. Will have patient bring cuff next appt.  3. Nonintractable headache, unspecified chronicity pattern, unspecified headache type ? Migraines. Using tylenol with relief  Preterm labor symptoms and  general obstetric precautions including but not limited to vaginal bleeding, contractions, leaking of fluid and fetal movement were reviewed in detail with the patient. I discussed the assessment and treatment plan with the patient. The patient was provided an opportunity to ask questions and all were answered. The patient agreed with the plan and  demonstrated an understanding of the instructions. The patient was advised to call back or seek an in-person office evaluation/go to MAU at [facility] for any urgent or concerning symptoms. Please refer to After Visit Summary for other counseling recommendations.   I provided 13 minutes of face-to-face time during this encounter.  No follow-ups on file.  Future Appointments  Date Time Provider [place]  06/25/2019  9:55 AM Truett Mainland, DO [place]  06/25/2019  3:45 PM [place] NURSE [place] MFC-US  06/25/2019  3:45 PM [place] 2 WH-MFCUS MFC-US  07/09/2019  3:35 PM Truett Mainland, DO WOC-WOCA [place]  07/16/2019  8:55 AM Darene Lamer, Dan Europe, DO WOC-WOCA [facility]OC  07/23/2019  9:35 AM Truett Mainland, DO WOC-WOCA [place]  07/30/2019  9:35 AM Nehemiah Settle, Tanna Savoy, DO WOC-WOCA [place] [facility] [employer] [place]

## 2019-06-11 NOTE — MAU Note (Signed)
Pt is a G1P0 at 32.1 week with a history of hypertension in pregnancy.  Pt c/o HA , dizziness, and swelling that started this morning.  She has not taken anything for the HA.    No other OB or medical concerns at this time.

## 2019-06-11 NOTE — Progress Notes (Signed)
Cuff changed to confirm previous BP

## 2019-06-14 ENCOUNTER — Encounter: Payer: BC Managed Care – PPO | Admitting: Obstetrics and Gynecology

## 2019-06-14 ENCOUNTER — Other Ambulatory Visit: Payer: BC Managed Care – PPO

## 2019-06-15 ENCOUNTER — Encounter: Payer: Self-pay | Admitting: *Deleted

## 2019-06-22 ENCOUNTER — Encounter (HOSPITAL_COMMUNITY): Payer: Self-pay | Admitting: Obstetrics & Gynecology

## 2019-06-22 ENCOUNTER — Other Ambulatory Visit: Payer: Self-pay

## 2019-06-22 ENCOUNTER — Inpatient Hospital Stay (HOSPITAL_COMMUNITY)
Admission: AD | Admit: 2019-06-22 | Discharge: 2019-06-22 | Disposition: A | Discharge status: Home / Self Care | Payer: BC Managed Care – PPO | Attending: Obstetrics & Gynecology | Admitting: Obstetrics & Gynecology

## 2019-06-22 DIAGNOSIS — M79605 Pain in left leg: Secondary | ICD-10-CM | POA: Diagnosis not present

## 2019-06-22 DIAGNOSIS — R109 Unspecified abdominal pain: Secondary | ICD-10-CM | POA: Insufficient documentation

## 2019-06-22 DIAGNOSIS — Z881 Allergy status to other antibiotic agents status: Secondary | ICD-10-CM | POA: Diagnosis not present

## 2019-06-22 DIAGNOSIS — Z882 Allergy status to sulfonamides status: Secondary | ICD-10-CM | POA: Diagnosis not present

## 2019-06-22 DIAGNOSIS — M79604 Pain in right leg: Secondary | ICD-10-CM | POA: Insufficient documentation

## 2019-06-22 DIAGNOSIS — R600 Localized edema: Secondary | ICD-10-CM

## 2019-06-22 DIAGNOSIS — Z3A33 33 weeks gestation of pregnancy: Secondary | ICD-10-CM | POA: Insufficient documentation

## 2019-06-22 DIAGNOSIS — Z7982 Long term (current) use of aspirin: Secondary | ICD-10-CM | POA: Diagnosis not present

## 2019-06-22 DIAGNOSIS — M7989 Other specified soft tissue disorders: Secondary | ICD-10-CM | POA: Diagnosis not present

## 2019-06-22 DIAGNOSIS — Z880 Allergy status to penicillin: Secondary | ICD-10-CM | POA: Insufficient documentation

## 2019-06-22 DIAGNOSIS — O26893 Other specified pregnancy related conditions, third trimester: Secondary | ICD-10-CM | POA: Diagnosis not present

## 2019-06-22 LAB — URINALYSIS, COMPLETE (UACMP) WITH MICROSCOPIC
Bilirubin Urine: NEGATIVE
Glucose, UA: NEGATIVE mg/dL
Hgb urine dipstick: NEGATIVE
Ketones, ur: 5 mg/dL — AB
Leukocytes,Ua: NEGATIVE
Nitrite: NEGATIVE
Protein, ur: NEGATIVE mg/dL
Specific Gravity, Urine: 1.019 (ref 1.005–1.030)
pH: 6 (ref 5.0–8.0)

## 2019-06-22 NOTE — Discharge Instructions (Signed)
Peripheral Edema  Peripheral edema is swelling that is caused by a buildup of fluid. Peripheral edema most often affects the lower legs, ankles, and feet. It can also develop in the arms, hands, and face. The area of the body that has peripheral edema will look swollen. It may also feel heavy or warm. Your clothes may start to feel tight. Pressing on the area may make a temporary dent in your skin. You may not be able to move your swollen arm or leg as much as usual. There are many causes of peripheral edema. It can happen because of a complication of other conditions such as congestive heart failure, kidney disease, or a problem with your blood circulation. It also can be a side effect of certain medicines or because of an infection. It often happens to women during pregnancy. Sometimes, the cause is not known. Follow these instructions at home: Managing pain, stiffness, and swelling   Raise (elevate) your legs while you are sitting or lying down.  Move around often to prevent stiffness and to lessen swelling.  Do not sit or stand for long periods of time.  Wear support stockings as told by your health care provider. Medicines  Take over-the-counter and prescription medicines only as told by your health care provider.  Your health care provider may prescribe medicine to help your body get rid of excess water (diuretic). General instructions  Pay attention to any changes in your symptoms.  Follow instructions from your health care provider about limiting salt (sodium) in your diet. Sometimes, eating less salt may reduce swelling.  Moisturize skin daily to help prevent skin from cracking and draining.  Keep all follow-up visits as told by your health care provider. This is important. Contact a health care provider if you have:  A fever.  Edema that starts suddenly or is getting worse, especially if you are pregnant or have a medical condition.  Swelling in only one leg.  Increased  swelling, redness, or pain in one or both of your legs.  Drainage or sores at the area where you have edema. Get help right away if you:  Develop shortness of breath, especially when you are lying down.  Have pain in your chest or abdomen.  Feel weak.  Feel faint. Summary  Peripheral edema is swelling that is caused by a buildup of fluid. Peripheral edema most often affects the lower legs, ankles, and feet.  Move around often to prevent stiffness and to lessen swelling. Do not sit or stand for long periods of time.  Pay attention to any changes in your symptoms.  Contact a health care provider if you have edema that starts suddenly or is getting worse, especially if you are pregnant or have a medical condition.  Get help right away if you develop shortness of breath, especially when lying down. This information is not intended to replace advice given to you by your health care provider. Make sure you discuss any questions you have with your health care provider. Document Revised: 01/18/2018 Document Reviewed: 01/18/2018 Elsevier Patient Education  2020 Elsevier Inc.  

## 2019-06-22 NOTE — MAU Provider Note (Addendum)
Patient Alexandra Collins is a 24 y.o. G1P0;  at [redacted]w[redacted]d here with complaints swollen feet for 7 days, two episodes of seeing stars after standing up, and occasional abdominal pain. The abdominal pain is like a "little cramp" that comes and goes.   She denies LOF, vaginal bleeding, vaginal discharge, decreased fetal movements. She denies HA, blurry vision, RUQ pain, floating spots or other ob-gyn complaints.   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 She thinks that her leg swelling is due to standing at school.  History     CSN: [id number]  Arrival date and time: 06/22/19 1958   First Provider Initiated Contact with Patient 06/22/19 2109      Chief Complaint  Patient presents with  . Leg Pain   Abdominal Pain This is a new problem. The current episode started in the past 7 days. The problem occurs intermittently. The pain is at a severity of 6/10. The quality of the pain is cramping. Associated symptoms include constipation, diarrhea and nausea. Pertinent negatives include no dysuria. The pain is relieved by nothing.   Patient also reports that her ankles and shins hurt from standing. She has tried rubbing them but it does not help.  OB History    Gravida  1   Para      Term      Preterm      AB      Living        SAB      TAB      Ectopic      Multiple      Live Births              Past Medical History:  Diagnosis Date  . Anemia   . Hydradenitis   . Pregnancy induced hypertension     Past Surgical History:  Procedure Laterality Date  . KNEE ARTHROSCOPY Right     Family History  Problem Relation Age of Onset  . Hypertension Mother   . Seizures Father   . Hypertension Father     Social History   Tobacco Use  . Smoking status: Never Smoker  . Smokeless tobacco: Never Used  Substance Use Topics  . Alcohol use: No  . Drug use: No    Allergies:  Allergies  Allergen Reactions  . Ceclor [Cefaclor] Hives  .  Garlic Hives  . Penicillins Hives    Has patient had a PCN reaction causing immediate rash, facial/tongue/throat swelling, SOB or lightheadedness with hypotension: Yes Has patient had a PCN reaction causing severe rash involving mucus membranes or skin necrosis: Yes Has patient had a PCN reaction that required hospitalization: No Has patient had a PCN reaction occurring within the last 10 years: No If all of the above answers are "NO", then may proceed with Cephalosporin use.   . Shellfish Allergy Itching  . Sulfamethoxazole Nausea And Vomiting  . Sulfur Hives  . Amoxicillin-Pot Clavulanate Rash    Medications Prior to Admission  Medication Sig Dispense Refill Last Dose  . aspirin EC 81 MG tablet Take 1 tablet (81 mg total) by mouth daily. Take after 12 weeks for prevention of preeclampsia later in pregnancy 300 tablet 2 06/21/2019 at Unknown time  . Calcium Carbonate Antacid (TUMS E-X PO) Take by mouth.   06/21/2019 at Unknown time  . cyclobenzaprine (FLEXERIL) 10 MG tablet Take 1 tablet (10 mg total) by mouth 3 (three) times daily as needed (back pain).  20 tablet 1 Past Week at Unknown time  . doxylamine, Sleep, (UNISOM) 25 MG tablet Take 1 tablet (25 mg total) by mouth at bedtime as needed. 30 tablet 0 Past Month at Unknown time  . Elastic Bandages & Supports (COMFORT FIT MATERNITY SUPP LG) MISC 1 application by Does not apply route daily. 1 each 0 06/21/2019 at Unknown time  . Prenatal Vit-Fe Fumarate-FA (MULTIVITAMIN-PRENATAL) 27-0.8 MG TABS tablet Take 2 tablets by mouth daily.    06/22/2019 at Unknown time  . EPINEPHrine 0.3 mg/0.3 mL IJ SOAJ injection Inject 0.3 mg into the muscle as needed for anaphylaxis.       Review of Systems  Gastrointestinal: Positive for abdominal pain, constipation, diarrhea and nausea.  Genitourinary: Negative for dysuria.   Physical Exam   Blood pressure 135/69, pulse 89, temperature 98 F (36.7 C), temperature source Oral, resp. rate 18, height 5\' 6"   (1.676 m), weight (!) 148.3 kg, last menstrual period 10/29/2018, SpO2 100 %.  Physical Exam  Constitutional: She appears well-developed.  HENT:  Head: Normocephalic.  Respiratory: Effort normal.  GI: Soft.  Musculoskeletal:        General: Edema present. Normal range of motion.     Cervical back: Normal range of motion.     Comments: + 2 edema, positive pedal pulses  Neurological: She is alert.  Skin: Skin is warm.    MAU Course  Procedures  MDM -NST: 120 bpm, mod var, present acel, neg decels, no contractions.  -Patient relieved to know that swelling is normal. She does not want her cervix checked. She desires discharge.   Assessment and Plan   1. Pain in both lower extremities    2. Reassurance and suggestions on managing leg pain , including hydration, exercise, elevation, prenatal massage and compression hose.   3. Keep follow up appt at [facility].   Mervyn Skeeters Brok Stocking 06/22/2019, 9:10 PM

## 2019-06-22 NOTE — MAU Note (Addendum)
PT SAYS HAS SWELLING IN LEGS AND  FEET- STARTED LAST Friday BUT WORSE TODAY . NO BP PROBLEMS IN OFFICE. [facility]  AT CLINIC.  BOTH LEGS- SHINS HURT  AND CRAMPS IN LEGS . SHE CALLED NURSE LINE-  TOLD TO COME.    SAYS  WED  SAW FLASHES OF LIGHT - NONE NOW, AND LOWER ABD PAIN YESTERDAY - STOPPED .   BACK HURTS NOW.

## 2019-06-25 ENCOUNTER — Ambulatory Visit (INDEPENDENT_AMBULATORY_CARE_PROVIDER_SITE_OTHER): Payer: Medicaid Other | Admitting: Family Medicine

## 2019-06-25 ENCOUNTER — Other Ambulatory Visit: Payer: Self-pay

## 2019-06-25 ENCOUNTER — Ambulatory Visit (HOSPITAL_COMMUNITY)
Admission: RE | Admit: 2019-06-25 | Discharge: 2019-06-25 | Disposition: A | Discharge status: Home / Self Care | Payer: BC Managed Care – PPO | Source: Ambulatory Visit | Attending: Obstetrics and Gynecology | Admitting: Obstetrics and Gynecology

## 2019-06-25 ENCOUNTER — Encounter (HOSPITAL_COMMUNITY): Payer: Self-pay | Admitting: *Deleted

## 2019-06-25 ENCOUNTER — Ambulatory Visit (HOSPITAL_COMMUNITY): Payer: BC Managed Care – PPO | Admitting: *Deleted

## 2019-06-25 ENCOUNTER — Encounter: Payer: Self-pay | Admitting: *Deleted

## 2019-06-25 ENCOUNTER — Encounter: Payer: Self-pay | Admitting: Family Medicine

## 2019-06-25 VITALS — BP 136/81 | HR 102 | Wt 331.0 lb

## 2019-06-25 DIAGNOSIS — O99213 Obesity complicating pregnancy, third trimester: Secondary | ICD-10-CM

## 2019-06-25 DIAGNOSIS — O0993 Supervision of high risk pregnancy, unspecified, third trimester: Secondary | ICD-10-CM

## 2019-06-25 DIAGNOSIS — O163 Unspecified maternal hypertension, third trimester: Secondary | ICD-10-CM | POA: Insufficient documentation

## 2019-06-25 DIAGNOSIS — O99210 Obesity complicating pregnancy, unspecified trimester: Secondary | ICD-10-CM

## 2019-06-25 DIAGNOSIS — Z3A32 32 weeks gestation of pregnancy: Secondary | ICD-10-CM

## 2019-06-25 DIAGNOSIS — Z362 Encounter for other antenatal screening follow-up: Secondary | ICD-10-CM | POA: Diagnosis not present

## 2019-06-25 DIAGNOSIS — O358XX0 Maternal care for other (suspected) fetal abnormality and damage, not applicable or unspecified: Secondary | ICD-10-CM | POA: Diagnosis not present

## 2019-06-25 DIAGNOSIS — Z3A34 34 weeks gestation of pregnancy: Secondary | ICD-10-CM

## 2019-06-25 NOTE — Progress Notes (Signed)
   PRENATAL VISIT NOTE  Subjective:  Alexandra Collins is a 24 y.o. G1P0 at [redacted]w[redacted]d being seen today for ongoing prenatal care.  She is currently monitored for the following issues for this high-risk pregnancy and has Pregnancy; Maternal morbid obesity, antepartum (HCC); Supervision of high-risk pregnancy; and Hypertension in pregnancy on their problem list.  Patient reports intermittent pelvic pain. Having some lightheadedness when on her feet for long periods of time. Systems analyst at [employer]  Contractions: Not present. Vag. Bleeding: None.  Movement: Present. Denies leaking of fluid.   The following portions of the patient's history were reviewed and updated as appropriate: allergies, current medications, past family history, past medical history, past social history, past surgical history and problem list.   Objective:   Vitals:   06/25/19 1027  BP: 136/81  Pulse: (!) 102  Weight: (!) 150.1 kg    Fetal Status: Fetal Heart Rate (bpm): 153   Movement: Present     General:  Alert, oriented and cooperative. Patient is in no acute distress.  Skin: Skin is warm and dry. No rash noted.   Cardiovascular: Normal heart rate noted  Respiratory: Normal respiratory effort, no problems with respiration noted  Abdomen: Soft, gravid, appropriate for gestational age.  Pain/Pressure: Absent     Pelvic: Cervical exam deferred        Extremities: Normal range of motion.  Edema: None  Mental Status: Normal mood and affect. Normal behavior. Normal judgment and thought content.   Assessment and Plan:  Pregnancy: G1P0 at [redacted]w[redacted]d 1. Supervision of high risk pregnancy in third trimester FHT and FH normal. Has growth [place] today. Discussed breaks and snacks (with protein). Will give letter for school.  2. Hypertension during pregnancy in third trimester, unspecified hypertension in pregnancy type Had spurriously elevated BP in MAU and had elevated BP at home. BP here always normal. Has smaller cuff  at home. On ASA 81mg  for elevated preeclampsia risk.  3. Maternal morbid obesity, antepartum (HCC)   Preterm labor symptoms and general obstetric precautions including but not limited to vaginal bleeding, contractions, leaking of fluid and fetal movement were reviewed in detail with the patient. Please refer to After Visit Summary for other counseling recommendations.   No follow-ups on file.  Future Appointments  Date Time Provider Department Center  06/25/2019  3:45 PM WH-MFC NURSE WH-MFC MFC-US  06/25/2019  3:45 PM WH-MFC 06/27/2019 2 WH-MFCUS MFC-US  07/09/2019  3:35 PM 09/08/2019, DO WOC-WOCA WOC  07/16/2019  8:55 AM 09/15/2019, Salomon Mast, DO WOC-WOCA WOC  07/23/2019  9:35 AM 07/25/2019, DO WOC-WOCA WOC  07/30/2019  9:35 AM 08/01/2019, Adrian Blackwater, DO WOC-WOCA WOC    Rhona Raider, DO

## 2019-07-09 ENCOUNTER — Other Ambulatory Visit: Payer: Self-pay

## 2019-07-09 ENCOUNTER — Other Ambulatory Visit (HOSPITAL_COMMUNITY)
Admission: RE | Admit: 2019-07-09 | Discharge: 2019-07-09 | Disposition: A | Discharge status: Home / Self Care | Payer: BC Managed Care – PPO | Source: Ambulatory Visit | Attending: Family Medicine | Admitting: Family Medicine

## 2019-07-09 ENCOUNTER — Ambulatory Visit (INDEPENDENT_AMBULATORY_CARE_PROVIDER_SITE_OTHER): Payer: Medicaid Other | Admitting: Family Medicine

## 2019-07-09 VITALS — BP 123/80 | HR 89

## 2019-07-09 DIAGNOSIS — O0993 Supervision of high risk pregnancy, unspecified, third trimester: Secondary | ICD-10-CM | POA: Insufficient documentation

## 2019-07-09 DIAGNOSIS — O163 Unspecified maternal hypertension, third trimester: Secondary | ICD-10-CM

## 2019-07-09 DIAGNOSIS — Z3A36 36 weeks gestation of pregnancy: Secondary | ICD-10-CM

## 2019-07-09 NOTE — Progress Notes (Signed)
   PRENATAL VISIT NOTE  Subjective:  Alexandra Collins is a 24 y.o. G1P0 at [redacted]w[redacted]d being seen today for ongoing prenatal care.  She is currently monitored for the following issues for this high-risk pregnancy and has Pregnancy; Maternal morbid obesity, antepartum (HCC); Supervision of high-risk pregnancy; and Hypertension in pregnancy on their problem list.  Patient reports occasional contractions.  Contractions: Not present.  .  Movement: Present. Denies leaking of fluid.   The following portions of the patient's history were reviewed and updated as appropriate: allergies, current medications, past family history, past medical history, past social history, past surgical history and problem list.   Objective:   Vitals:   07/09/19 1628  BP: 123/80  Pulse: 89    Fetal Status: Fetal Heart Rate (bpm): 130   Movement: Present  Presentation: Vertex  General:  Alert, oriented and cooperative. Patient is in no acute distress.  Skin: Skin is warm and dry. No rash noted.   Cardiovascular: Normal heart rate noted  Respiratory: Normal respiratory effort, no problems with respiration noted  Abdomen: Soft, gravid, appropriate for gestational age.  Pain/Pressure: Present     Pelvic: Cervical exam performed Dilation: Closed Effacement (%): Thick Station: -3  Extremities: Normal range of motion.     Mental Status: Normal mood and affect. Normal behavior. Normal judgment and thought content.   Assessment and Plan:  Pregnancy: G1P0 at [redacted]w[redacted]d 1. Supervision of high risk pregnancy in third trimester FHT and FH normal - GC/Chlamydia probe amp ([place])not at [facility] - Strep Gp B Culture+Rflx  2. Hypertension during pregnancy in third trimester, unspecified hypertension in pregnancy type Spurious elevated BP. Normal since then.  Preterm labor symptoms and general obstetric precautions including but not limited to vaginal bleeding, contractions, leaking of fluid and fetal movement were reviewed in detail  with the patient. Please refer to After Visit Summary for other counseling recommendations.   No follow-ups on file.  Future Appointments  Date Time Provider Department Center  07/16/2019  8:55 AM Marlowe Alt, DO WOC-WOCA WOC  07/23/2019  9:35 AM Levie Heritage, DO WOC-WOCA WOC  07/30/2019 10:35 AM Adrian Blackwater, Rhona Raider, DO WOC-WOCA WOC    Levie Heritage, DO

## 2019-07-09 NOTE — Patient Instructions (Signed)
[email or web address] -online to-tos -register for childbirth class -click on breastfeeding fundamentals class  [email or web address]

## 2019-07-11 ENCOUNTER — Encounter: Payer: Self-pay | Admitting: *Deleted

## 2019-07-11 LAB — GC/CHLAMYDIA PROBE AMP (~~LOC~~) NOT AT ARMC
Chlamydia: NEGATIVE
Comment: NEGATIVE
Comment: NORMAL
Neisseria Gonorrhea: NEGATIVE

## 2019-07-12 ENCOUNTER — Encounter: Payer: Self-pay | Admitting: *Deleted

## 2019-07-12 LAB — STREP GP B CULTURE+RFLX: Strep Gp B Culture+Rflx: NEGATIVE

## 2019-07-16 ENCOUNTER — Encounter: Payer: Self-pay | Admitting: Family Medicine

## 2019-07-16 ENCOUNTER — Telehealth (INDEPENDENT_AMBULATORY_CARE_PROVIDER_SITE_OTHER): Payer: BC Managed Care – PPO | Admitting: Family Medicine

## 2019-07-16 VITALS — BP 143/84 | HR 90

## 2019-07-16 DIAGNOSIS — O99210 Obesity complicating pregnancy, unspecified trimester: Secondary | ICD-10-CM

## 2019-07-16 DIAGNOSIS — O0993 Supervision of high risk pregnancy, unspecified, third trimester: Secondary | ICD-10-CM

## 2019-07-16 DIAGNOSIS — O163 Unspecified maternal hypertension, third trimester: Secondary | ICD-10-CM

## 2019-07-16 DIAGNOSIS — O99213 Obesity complicating pregnancy, third trimester: Secondary | ICD-10-CM

## 2019-07-16 DIAGNOSIS — Z3A37 37 weeks gestation of pregnancy: Secondary | ICD-10-CM

## 2019-07-16 NOTE — Progress Notes (Signed)
I connected with  [name] on 07/16/19 at 0820 by telephone and verified that I am speaking with the correct person using two identifiers.   I discussed the limitations, risks, security and privacy concerns of performing an evaluation and management service by telephone and the availability of in person appointments. I also discussed with the patient that there may be a patient responsible charge related to this service. The patient expressed understanding and agreed to proceed.  Marjo Bicker, RN 07/16/2019  8:20 AM

## 2019-07-16 NOTE — Progress Notes (Signed)
   TELEHEALTH VIRTUAL OBSTETRICS VISIT ENCOUNTER NOTE  I connected with Alexandra Collins on 07/16/19 at  8:55 AM EST by telephone at home and verified that I am speaking with the correct person using two identifiers.   I discussed the limitations, risks, security and privacy concerns of performing an evaluation and management service by telephone and the availability of in person appointments. I also discussed with the patient that there may be a patient responsible charge related to this service. The patient expressed understanding and agreed to proceed.  Subjective:  Alexandra Collins is a 24 y.o. G1P0 at 100w1d being followed for ongoing prenatal care.  She is currently monitored for the following issues for this high-risk pregnancy and has Pregnancy; Maternal morbid obesity, antepartum (HCC); Supervision of high-risk pregnancy; and Hypertension in pregnancy on their problem list.  Patient reports no complaints. Reports fetal movement. Denies any contractions, bleeding or leaking of fluid.   The following portions of the patient's history were reviewed and updated as appropriate: allergies, current medications, past family history, past medical history, past social history, past surgical history and problem list.   Objective:   General:  Alert, oriented and cooperative.   Mental Status: Normal mood and affect perceived. Normal judgment and thought content.  Rest of physical exam deferred due to type of encounter  Assessment and Plan:  Pregnancy: G1P0 at [redacted]w[redacted]d 1. Supervision of high risk pregnancy in third trimester  - Continue routine prenatal care - GBS negative - GC/CT negative - Discussed [employer] for optimal fetal positioning for labor  2. Hypertension during pregnancy in third trimester, unspecified hypertension in pregnancy type - BP today: 149/84, repeat 143/84; patient says her cuff is too small - Shared patient-decision making: options given of having a BP check here in the  office vs checking her BP at the [facility] pharmacy around the corner from her residence- she would rather go for a walk and check her BP at her pharmacy, then call us with the results. Advised if BP >140/>90 may need work up for Pre-E - No signs/symptoms Pre-E- discussed warning signs and reasons to go to MAU for evaluation - Baseline P:Cr 0.10; Baseline Cr 0.70  3. Maternal morbid obesity, antepartum (HCC) - on ASA 81 mg daily  Term labor symptoms and general obstetric precautions including but not limited to vaginal bleeding, contractions, leaking of fluid and fetal movement were reviewed in detail with the patient.  I discussed the assessment and treatment plan with the patient. The patient was provided an opportunity to ask questions and all were answered. The patient agreed with the plan and demonstrated an understanding of the instructions. The patient was advised to call back or seek an in-person office evaluation/go to MAU at [facility] for any urgent or concerning symptoms. Please refer to After Visit Summary for other counseling recommendations.   I provided 11 minutes of non-face-to-face time during this encounter.  Return in about 1 week (around 07/23/2019) for [facility]- virtual ok.  Future Appointments  Date Time Provider Department Center  07/16/2019  8:55 AM Marlowe Alt, DO [facility]  07/23/2019  9:35 AM Levie Heritage, DO [facility]  07/30/2019 10:35 AM Adrian Blackwater, Rhona Raider, DO [facility]    Marlowe Alt, DO Center for [employer], [facility]

## 2019-07-16 NOTE — Patient Instructions (Signed)
Signs and Symptoms of Labor Labor is your body's natural process of moving your baby, placenta, and umbilical cord out of your uterus. The process of labor usually starts when your baby is full-term, between 37 and 40 weeks of pregnancy. How will I know when I am close to going into labor? As your body prepares for labor and the birth of your baby, you may notice the following symptoms in the weeks and days before true labor starts:  Having a strong desire to get your home ready to receive your new baby. This is called nesting. Nesting may be a sign that labor is approaching, and it may occur several weeks before birth. Nesting may involve cleaning and organizing your home.  Passing a small amount of thick, bloody mucus out of your vagina (normal bloody show or losing your mucus plug). This may happen more than a week before labor begins, or it might occur right before labor begins as the opening of the cervix starts to widen (dilate). For some women, the entire mucus plug passes at once. For others, smaller portions of the mucus plug may gradually pass over several days.  Your baby moving (dropping) lower in your pelvis to get into position for birth (lightening). When this happens, you may feel more pressure on your bladder and pelvic bone and less pressure on your ribs. This may make it easier to breathe. It may also cause you to need to urinate more often and have problems with bowel movements.  Having "practice contractions" (Braxton Hicks contractions) that occur at irregular (unevenly spaced) intervals that are more than 10 minutes apart. This is also called false labor. False labor contractions are common after exercise or sexual activity, and they will stop if you change position, rest, or drink fluids. These contractions are usually mild and do not get stronger over time. They may feel like: ? A backache or back pain. ? Mild cramps, similar to menstrual cramps. ? Tightening or pressure in  your abdomen. Other early symptoms that labor may be starting soon include:  Nausea or loss of appetite.  Diarrhea.  Having a sudden burst of energy, or feeling very tired.  Mood changes.  Having trouble sleeping. How will I know when labor has begun? Signs that true labor has begun may include:  Having contractions that come at regular (evenly spaced) intervals and increase in intensity. This may feel like more intense tightening or pressure in your abdomen that moves to your back. ? Contractions may also feel like rhythmic pain in your upper thighs or back that comes and goes at regular intervals. ? For first-time mothers, this change in intensity of contractions often occurs at a more gradual pace. ? Women who have given birth before may notice a more rapid progression of contraction changes.  Having a feeling of pressure in the vaginal area.  Your water breaking (rupture of membranes). This is when the sac of fluid that surrounds your baby breaks. When this happens, you will notice fluid leaking from your vagina. This may be clear or blood-tinged. Labor usually starts within 24 hours of your water breaking, but it may take longer to begin. ? Some women notice this as a gush of fluid. ? Others notice that their underwear repeatedly becomes damp. Follow these instructions at home:   When labor starts, or if your water breaks, call your health care provider or nurse care line. Based on your situation, they will determine when you should go in for an   exam.  When you are in early labor, you may be able to rest and manage symptoms at home. Some strategies to try at home include: ? Breathing and relaxation techniques. ? Taking a warm bath or shower. ? Listening to music. ? Using a heating pad on the lower back for pain. If you are directed to use heat:  Place a towel between your skin and the heat source.  Leave the heat on for 20-30 minutes.  Remove the heat if your skin turns  bright red. This is especially important if you are unable to feel pain, heat, or cold. You may have a greater risk of getting burned. Get help right away if:  You have painful, regular contractions that are 5 minutes apart or less.  Labor starts before you are [redacted] weeks along in your pregnancy.  You have a fever.  You have a headache that does not go away.  You have bright red blood coming from your vagina.  You do not feel your baby moving.  You have a sudden onset of: ? Severe headache with vision problems. ? Nausea, vomiting, or diarrhea. ? Chest pain or shortness of breath. These symptoms may be an emergency. If your health care provider recommends that you go to the hospital or birth center where you plan to deliver, do not drive yourself. Have someone else drive you, or call emergency services (911 in the [place] Summary  Labor is your body's natural process of moving your baby, placenta, and umbilical cord out of your uterus.  The process of labor usually starts when your baby is full-term, between 67 and 40 weeks of pregnancy.  When labor starts, or if your water breaks, call your health care provider or nurse care line. Based on your situation, they will determine when you should go in for an exam. This information is not intended to replace advice given to you by your health care provider. Make sure you discuss any questions you have with your health care provider. Document Revised: 01/24/2017 Document Reviewed: 10/01/2016 Elsevier Patient Education  2020 [employer].  The [employer]  This circuit takes at least 90 minutes to complete so clear your schedule and make mental preparations so you can relax in your environment. The second step requires a lot of pillows so gather them up before beginning Before starting, you should empty your bladder! Have a nice drink nearby, and make sure it has a straw! If you are having contractions, this circuit should be done  through contractions, try not to change positions between steps Before you begin...  "I named this 'circuit' after my friend Deneen Harts, who shared and discussed it with me when I was working with a client whose labor seemed to be stalled out and no longer progressing... This circuit is useful to help get the baby lined up, ideally, in the "Left Occiput Anterior" (LOA) Position, both before labor begins and when some corrections need to be done during labor. Prenatally, this position set can help to rotate a baby. As a natural method of induction, this can help get things going if baby just needed a gentle nudge of position to set things off. To the best of my knowledge, this group of positions will not "hurt" a baby that is already lined up correctly." - Sharlyn Bologna   Step One: Open-knee Chest Stay in this position for 30 minutes, start in cat/cow, then drop your chest as low as you can to the bed or the floor  and your bottom as high as you can. Knees should be fairly wide apart, and the angle between the torso/thighs should be wider than 90 degrees. Wiggle around, prop with lots of pillows and use this time to get totally relaxed. This position allows the baby to scoot out of the pelvis a bit and gives them room to rotate, shift their head position, etc. If the pregnant person finds it helpful, careful positioning with a rebozo under the belly, with gentle tension from a support person behind can help maintain this position for the full 30 minutes.  Step WVP:XTGGYIRSWNI Left Side Lying Roll to your left side, bringing your top leg as high as possible and keeping your bottom leg straight. Roll forward as much as possible, again using a lot of pillows. Sink into the bed and relax some more. If you fall asleep, that's totally okay and you can stay there! If not, stay here for at least another half an hour. Try and get your top right leg up towards your head and get as rolled  over onto your belly as much as possible. If you repeat the circuit during labor, try alternating left and right sides. We know the photo the left is actually right side... just flip the image in your head.  Step Three: Moving and Lunges Lunge, walk stairs facing sideways, 2 at a time, (have a spotter downstairs of you!), take a walk outside with one foot on the curb and the other on the street, sit on a birth ball and hula- anything that's upright and putting your pelvis in open, asymmetrical positions. Spend at least 30 minutes doing this one as well to give your baby a chance to move down. If you are lunging or stair or curb walking, you should lunge/walk/go up stairs in the direction that feels better to you. The key with the lunge is that the toes of the higher leg and mom's belly button should be at right angles. Do not lunge over your knee, that closes the pelvis.     [place] Creator - [email or web address] Greggory Stallion, CD, BDT (DONA), LCCE, FACCE: Supporting Content - [email or web address] Jon Gills: Photography - [email or web address] Trina Ao CD/CDT [facility]): Print and Webmaster - [email or web address]

## 2019-07-23 ENCOUNTER — Inpatient Hospital Stay (HOSPITAL_COMMUNITY)
Admission: AD | Admit: 2019-07-23 | Discharge: 2019-07-29 | DRG: 807 | Disposition: A | Discharge status: Home / Self Care | Payer: BC Managed Care – PPO | Attending: Family Medicine | Admitting: Family Medicine

## 2019-07-23 ENCOUNTER — Encounter: Payer: Self-pay | Admitting: Family Medicine

## 2019-07-23 ENCOUNTER — Other Ambulatory Visit: Payer: Self-pay

## 2019-07-23 ENCOUNTER — Encounter (HOSPITAL_COMMUNITY): Payer: Self-pay | Admitting: Obstetrics & Gynecology

## 2019-07-23 ENCOUNTER — Telehealth (INDEPENDENT_AMBULATORY_CARE_PROVIDER_SITE_OTHER): Payer: BC Managed Care – PPO | Admitting: Family Medicine

## 2019-07-23 DIAGNOSIS — O9902 Anemia complicating childbirth: Secondary | ICD-10-CM | POA: Diagnosis present

## 2019-07-23 DIAGNOSIS — O0993 Supervision of high risk pregnancy, unspecified, third trimester: Secondary | ICD-10-CM

## 2019-07-23 DIAGNOSIS — Z3A38 38 weeks gestation of pregnancy: Secondary | ICD-10-CM | POA: Diagnosis not present

## 2019-07-23 DIAGNOSIS — Z20822 Contact with and (suspected) exposure to covid-19: Secondary | ICD-10-CM | POA: Diagnosis present

## 2019-07-23 DIAGNOSIS — O163 Unspecified maternal hypertension, third trimester: Secondary | ICD-10-CM

## 2019-07-23 DIAGNOSIS — O99210 Obesity complicating pregnancy, unspecified trimester: Secondary | ICD-10-CM | POA: Diagnosis present

## 2019-07-23 DIAGNOSIS — Z880 Allergy status to penicillin: Secondary | ICD-10-CM | POA: Diagnosis not present

## 2019-07-23 DIAGNOSIS — O134 Gestational [pregnancy-induced] hypertension without significant proteinuria, complicating childbirth: Secondary | ICD-10-CM | POA: Diagnosis present

## 2019-07-23 DIAGNOSIS — Z3490 Encounter for supervision of normal pregnancy, unspecified, unspecified trimester: Secondary | ICD-10-CM | POA: Diagnosis present

## 2019-07-23 DIAGNOSIS — O894 Spinal and epidural anesthesia-induced headache during the puerperium: Secondary | ICD-10-CM | POA: Diagnosis not present

## 2019-07-23 DIAGNOSIS — O133 Gestational [pregnancy-induced] hypertension without significant proteinuria, third trimester: Secondary | ICD-10-CM | POA: Diagnosis not present

## 2019-07-23 DIAGNOSIS — O139 Gestational [pregnancy-induced] hypertension without significant proteinuria, unspecified trimester: Secondary | ICD-10-CM | POA: Diagnosis present

## 2019-07-23 DIAGNOSIS — O0990 Supervision of high risk pregnancy, unspecified, unspecified trimester: Secondary | ICD-10-CM

## 2019-07-23 DIAGNOSIS — O99214 Obesity complicating childbirth: Secondary | ICD-10-CM | POA: Diagnosis present

## 2019-07-23 DIAGNOSIS — G971 Other reaction to spinal and lumbar puncture: Secondary | ICD-10-CM

## 2019-07-23 DIAGNOSIS — D649 Anemia, unspecified: Secondary | ICD-10-CM | POA: Diagnosis present

## 2019-07-23 LAB — TYPE AND SCREEN
ABO/RH(D): O POS
Antibody Screen: NEGATIVE

## 2019-07-23 LAB — CBC
HCT: 32.1 % — ABNORMAL LOW (ref 36.0–46.0)
Hemoglobin: 10.4 g/dL — ABNORMAL LOW (ref 12.0–15.0)
MCH: 28 pg (ref 26.0–34.0)
MCHC: 32.4 g/dL (ref 30.0–36.0)
MCV: 86.3 fL (ref 80.0–100.0)
Platelets: 430 10*3/uL — ABNORMAL HIGH (ref 150–400)
RBC: 3.72 MIL/uL — ABNORMAL LOW (ref 3.87–5.11)
RDW: 13.1 % (ref 11.5–15.5)
WBC: 6.9 10*3/uL (ref 4.0–10.5)
nRBC: 0 % (ref 0.0–0.2)

## 2019-07-23 LAB — COMPREHENSIVE METABOLIC PANEL
ALT: 15 U/L (ref 0–44)
AST: 18 U/L (ref 15–41)
Albumin: 2.6 g/dL — ABNORMAL LOW (ref 3.5–5.0)
Alkaline Phosphatase: 89 U/L (ref 38–126)
Anion gap: 10 (ref 5–15)
BUN: 5 mg/dL — ABNORMAL LOW (ref 6–20)
CO2: 22 mmol/L (ref 22–32)
Calcium: 8.7 mg/dL — ABNORMAL LOW (ref 8.9–10.3)
Chloride: 104 mmol/L (ref 98–111)
Creatinine, Ser: 0.7 mg/dL (ref 0.44–1.00)
GFR calc Af Amer: >60 mL/min (ref >60)
GFR calc non Af Amer: >60 mL/min (ref >60)
Glucose, Bld: 108 mg/dL — ABNORMAL HIGH (ref 70–99)
Potassium: 3.5 mmol/L (ref 3.5–5.1)
Sodium: 136 mmol/L (ref 135–145)
Total Bilirubin: 0.5 mg/dL (ref 0.3–1.2)
Total Protein: 6.1 g/dL — ABNORMAL LOW (ref 6.5–8.1)

## 2019-07-23 LAB — PROTEIN / CREATININE RATIO, URINE
Creatinine, Urine: 120.49 mg/dL
Protein Creatinine Ratio: 0.07 mg/mg{Cre} (ref 0.00–0.15)
Total Protein, Urine: 9 mg/dL

## 2019-07-23 LAB — SARS CORONAVIRUS 2 (TAT 6-24 HRS): SARS Coronavirus 2: NEGATIVE

## 2019-07-23 MED ORDER — MISOPROSTOL 50MCG HALF TABLET
50.0000 ug | ORAL_TABLET | ORAL | Status: DC
  Administered 2019-07-23 – 2019-07-24 (×4): 50 ug via BUCCAL
  Filled 2019-07-23 (×4): qty 1

## 2019-07-23 MED ORDER — LACTATED RINGERS IV SOLN: INTRAVENOUS | Status: DC

## 2019-07-23 MED ORDER — ACETAMINOPHEN 325 MG PO TABS
650.0000 mg | ORAL_TABLET | ORAL | Status: DC | PRN
  Administered 2019-07-26: 650 mg via ORAL
  Filled 2019-07-23: qty 2

## 2019-07-23 MED ORDER — ONDANSETRON HCL 4 MG/2ML IJ SOLN
4.0000 mg | Freq: Four times a day (QID) | INTRAMUSCULAR | Status: DC | PRN
  Administered 2019-07-26: 4 mg via INTRAVENOUS
  Filled 2019-07-23: qty 2

## 2019-07-23 MED ORDER — LIDOCAINE HCL (PF) 1 % IJ SOLN: 30.0000 mL | INTRAMUSCULAR | Status: DC | PRN

## 2019-07-23 MED ORDER — MISOPROSTOL 50MCG HALF TABLET
ORAL_TABLET | ORAL | Status: AC
  Administered 2019-07-23: 50 ug
  Filled 2019-07-23: qty 1

## 2019-07-23 MED ORDER — LACTATED RINGERS IV SOLN: 500.0000 mL | INTRAVENOUS | Status: DC | PRN

## 2019-07-23 MED ORDER — SOD CITRATE-CITRIC ACID 500-334 MG/5ML PO SOLN: 30.0000 mL | ORAL | Status: DC | PRN

## 2019-07-23 MED ORDER — OXYTOCIN 40 UNITS IN NORMAL SALINE INFUSION - SIMPLE MED: 2.5000 [IU]/h | INTRAVENOUS | Status: DC

## 2019-07-23 MED ORDER — OXYTOCIN BOLUS FROM INFUSION
500.0000 mL | Freq: Once | INTRAVENOUS | Status: AC
  Administered 2019-07-26: 500 mL via INTRAVENOUS

## 2019-07-23 MED ORDER — FENTANYL CITRATE (PF) 100 MCG/2ML IJ SOLN
50.0000 ug | INTRAMUSCULAR | Status: DC | PRN
  Administered 2019-07-26: 100 ug via INTRAVENOUS
  Filled 2019-07-23 (×2): qty 2

## 2019-07-23 NOTE — H&P (Signed)
OBSTETRIC ADMISSION HISTORY AND PHYSICAL  Alexandra Collins is a 24 y.o. female G1P0 with IUP at [redacted]w[redacted]d presenting for IOL 2/2 gestational hypertension- blood pressures have been 140s/90s for last 2 office visits. She reports +FMs. No LOF, VB, blurry vision, headaches, peripheral edema, or RUQ pain. She plans on breastfeeding. She requests OCPs for birth control.  Dating: By 7 week [place] --->  Estimated Date of Delivery: 08/05/19  Sono:   @[redacted]w[redacted]d , normal anatomy, cephalic presentation, 6283T, 44%ile, EFW 5#3 -abdominal wall not well visualized -4 chambered heart not well visualized  Prenatal History/Complications: Morbid obesity Gestational hypertension  Past Medical History: Past Medical History:  Diagnosis Date  . Anemia   . Hydradenitis     Past Surgical History: Past Surgical History:  Procedure Laterality Date  . KNEE ARTHROSCOPY Right     Obstetrical History: OB History    Gravida  1   Para      Term      Preterm      AB      Living        SAB      TAB      Ectopic      Multiple      Live Births              Social History: Social History   Socioeconomic History  . Marital status: Married    Spouse name: Not on file  . Number of children: Not on file  . Years of education: Not on file  . Highest education level: Not on file  Occupational History  . Not on file  Tobacco Use  . Smoking status: Never Smoker  . Smokeless tobacco: Never Used  Substance and Sexual Activity  . Alcohol use: No  . Drug use: No  . Sexual activity: Yes    Birth control/protection: None  Other Topics Concern  . Not on file  Social History Narrative  . Not on file   Social Determinants of Health   Financial Resource Strain:   . Difficulty of Paying Living Expenses:   Food Insecurity:   . Worried About Charity fundraiser in the Last Year:   . Arboriculturist in the Last Year:   Transportation Needs:   . Film/video editor (Medical):   Marland Kitchen Lack of  Transportation (Non-Medical):   Physical Activity:   . Days of Exercise per Week:   . Minutes of Exercise per Session:   Stress:   . Feeling of Stress :   Social Connections:   . Frequency of Communication with Friends and Family:   . Frequency of Social Gatherings with Friends and Family:   . Attends Religious Services:   . Active Member of Clubs or Organizations:   . Attends Archivist Meetings:   Marland Kitchen Marital Status:     Family History: Family History  Problem Relation Age of Onset  . Hypertension Mother   . Seizures Father   . Hypertension Father     Allergies: Allergies  Allergen Reactions  . Ceclor [Cefaclor] Hives  . Garlic Hives  . Penicillins Hives    Has patient had a PCN reaction causing immediate rash, facial/tongue/throat swelling, SOB or lightheadedness with hypotension: Yes Has patient had a PCN reaction causing severe rash involving mucus membranes or skin necrosis: Yes Has patient had a PCN reaction that required hospitalization: No Has patient had a PCN reaction occurring within the last 10 years: No If all of the above  answers are "NO", then may proceed with Cephalosporin use.   . Shellfish Allergy Itching  . Sulfamethoxazole Nausea And Vomiting  . Sulfur Hives  . Amoxicillin-Pot Clavulanate Rash    Medications Prior to Admission  Medication Sig Dispense Refill Last Dose  . aspirin EC 81 MG tablet Take 1 tablet (81 mg total) by mouth daily. Take after 12 weeks for prevention of preeclampsia later in pregnancy 300 tablet 2   . Calcium Carbonate Antacid (TUMS E-X PO) Take by mouth.     . doxylamine, Sleep, (UNISOM) 25 MG tablet Take 1 tablet (25 mg total) by mouth at bedtime as needed. 30 tablet 0   . EPINEPHrine 0.3 mg/0.3 mL IJ SOAJ injection Inject 0.3 mg into the muscle as needed for anaphylaxis.     . Prenatal Vit-Fe Fumarate-FA (MULTIVITAMIN-PRENATAL) 27-0.8 MG TABS tablet Take 2 tablets by mouth daily.         Review of Systems:  All  systems reviewed and negative except as stated in HPI  PE: Blood pressure 139/80, pulse 80, temperature 98 F (36.7 C), temperature source Oral, resp. rate 16, height 5\' 6"  (1.676 m), weight (!) 148.8 kg, last menstrual period 10/29/2018. General appearance: alert and cooperative Lungs: regular rate and effort Heart: regular rate  Abdomen: soft, non-tender Extremities: Homans sign is negative, no sign of DVT Presentation: cephalic EFM: 135 bpm, moderate variability, 15x15 accels, no decels Toco: no contractions Dilation: Fingertip Effacement (%): Thick Station: -3 Exam by:: [email or web address] RNC  Prenatal labs: ABO, Rh: --/--/O POS (09/14 1855) Antibody: Negative (07/20 0000) Rubella: Immune (08/13 0000) RPR: Non Reactive (01/18 0955)  HBsAg: Negative (08/13 0000)  HIV: Non Reactive (01/18 0955)  GBS: Negative/-- (03/01 1623)  2 hr GTT normal  Prenatal Transfer Tool  Maternal Diabetes: No Genetic Screening: Normal Maternal Ultrasounds/Referrals: Normal Fetal Ultrasounds or other Referrals:  None Maternal Substance Abuse:  No Significant Maternal Medications:  None Significant Maternal Lab Results: Group B Strep negative  No results found for this or any previous visit (from the past 24 hour(s)).  Patient Active Problem List   Diagnosis Date Noted  . Encounter for induction of labor 07/23/2019  . Hypertension in pregnancy 05/17/2019  . Supervision of high-risk pregnancy 04/09/2019  . Pregnancy 12/21/2018  . Maternal morbid obesity, antepartum (HCC) 12/21/2018    Assessment: Alexandra Collins is a 24 y.o. G1P0 at [redacted]w[redacted]d here for IOL 2/2 gestational hypertension  1. Labor: Start with cytotec for cervical ripening. Consider FB placement next check. 2. FWB: Cat I, EFW 7#8-8# 3. Pain: per patient request 4. GBS: negative 5. Gestational hypertension: No signs/symptoms of Pre-E. Pre-E labs collected. Normotensive at this time, continue to monitor.   Plan: Admit to L&D.  Risks and  benefits of induction were reviewed, including failure of method, prolonged labor, need for further intervention, risk of cesarean.  Patient and family seem to understand these risks and wish to proceed. Options of cytotec, foley bulb, AROM, and pitocin reviewed, with use of each discussed.  Anticipate vaginal delivery.  Alexandra Qu L Casia Corti, DO  07/23/2019, 1:50 PM

## 2019-07-23 NOTE — Progress Notes (Signed)
Alexandra Collins is a 24 y.o. G1P0 at 75w1dadmitted for IOL 2/2 gHTN.  Subjective: Comfortable. Minimal cramping. Ate dinner. Met patient and discussed plan.   Objective: BP (!) 144/75   Pulse 97   Temp 97.8 F (36.6 C) (Oral)   Resp 18   Ht '5\' 6"'  (1.676 m)   Wt (!) 148.8 kg   LMP 10/29/2018   BMI 52.94 kg/m  No intake/output data recorded.  FHT:  FHR: 135 bpm, variability: moderate,  accelerations:  Present,  decelerations:  Absent UC:   q2-348mSVE:   Dilation: Fingertip Effacement (%): Thick Station: -3 Exam by:: H Sparacino DO  Labs: Lab Results  Component Value Date   WBC 6.9 07/23/2019   HGB 10.4 (L) 07/23/2019   HCT 32.1 (L) 07/23/2019   MCV 86.3 07/23/2019   PLT 430 (H) 07/23/2019    Assessment / Plan: Alexandra Collins a [age].o. G1P0 at 389w1dre for IOL 2/2 gestational hypertension.  1. Labor: S/p cyto x 2. Patient agreeable to FB. Foley bulb placed with speculum and filled with 60 mL water; patient tolerated well. Cont Cytotec while FB in place. Anticipate SVD. 2. FWB: Cat I 3. Pain: per patient request 4. GBS: negative 5. Gestational hypertension: Pre-E labs normal. Mild range pressures thus far.   Alexandra Collins OB Fellow, Faculty Practice 07/23/2019, 9:05 PM

## 2019-07-23 NOTE — Progress Notes (Signed)
TELEHEALTH OBSTETRICS PRENATAL VIRTUAL VIDEO VISIT ENCOUNTER NOTE  Provider location: [facility] [place]   I connected with Alexandra Collins on 07/23/19 at  9:35 AM EDT by MyChart Video Encounter at home and verified that I am speaking with the correct person using two identifiers.   I discussed the limitations, risks, security and privacy concerns of performing an evaluation and management service virtually and the availability of in person appointments. I also discussed with the patient that there may be a patient responsible charge related to this service. The patient expressed understanding and agreed to proceed. Subjective:  Alexandra Collins is a 24 y.o. G1P0 at [redacted]w[redacted]d being seen today for ongoing prenatal care.  She is currently monitored for the following issues for this high-risk pregnancy and has Pregnancy; Maternal morbid obesity, antepartum (HCC); Supervision of high-risk pregnancy; and Hypertension in pregnancy on their problem list.  Patient reports no complaints.  Contractions: Irritability. Vag. Bleeding: None.  Movement: Present. Denies any leaking of fluid.   The following portions of the patient's history were reviewed and updated as appropriate: allergies, current medications, past family history, past medical history, past social history, past surgical history and problem list.   Objective:   Vitals:   07/23/19 0850 07/23/19 0900  BP: (!) 150/83 (!) 145/88  Pulse:  81    Fetal Status:     Movement: Present     General:  Alert, oriented and cooperative. Patient is in no acute distress.  Respiratory: Normal respiratory effort, no problems with respiration noted  Mental Status: Normal mood and affect. Normal behavior. Normal judgment and thought content.  Rest of physical exam deferred due to type of encounter  Imaging: [place] MFM OB FOLLOW UP  Result Date: 06/25/2019 ----------------------------------------------------------------------  OBSTETRICS REPORT                        (Signed Final 06/25/2019 04:46 pm) ---------------------------------------------------------------------- Patient Info  ID #:       [id number]                          D.O.B.:  [birth year] (23 yrs)  Name:       Alexandra Collins                   Visit Date: 06/25/2019 04:01 pm ---------------------------------------------------------------------- Performed By  Performed By:     Lenise Arena        Ref. Address:     [address]                    RD[address]                                                             Rd                                                             [employer]  [place]  Attending:        Tama High MD        [facility]  Referred By:      Truett Mainland                    MD ---------------------------------------------------------------------- Orders   #  Description                          Code         Ordered By   1  [place] MFM OB FOLLOW UP                  [phone]     [facility]  ----------------------------------------------------------------------   #  Order #                    Accession #                 Episode #   1  [id number]                  [id number]                  [id number]  ---------------------------------------------------------------------- Indications   Maternal morbid obesity (BMI 17)               O99.210 E66.01   Encounter for other antenatal screening        Z36.2   follow-up   Anemia during pregnancy in third trimester     O99.013   [redacted] weeks gestation of pregnancy                Z3A.34  ---------------------------------------------------------------------- Fetal Evaluation  Num Of Fetuses:         1  Fetal Heart Rate(bpm):  148  Cardiac Activity:       Observed  Presentation:           Cephalic  Placenta:               Posterior  P. Cord Insertion:      Previously Visualized  Amniotic  Fluid  AFI FV:      Within normal limits  AFI Sum(cm)     %Tile       Largest Pocket(cm)  13.32           44          3.86  RUQ(cm)       RLQ(cm)       LUQ(cm)        LLQ(cm)  3.23          3.86          2.95           3.28 ---------------------------------------------------------------------- Biometry  BPD:      89.9  mm     G. Age:  36w 3d         95  %    CI:        82.27   %  70 - 86                                                          FL/HC:      21.3   %    19.4 - 21.8  HC:      312.7  mm     G. Age:  35w 0d         35  %    HC/AC:      1.06        0.96 - 1.11  AC:      295.4  mm     G. Age:  33w 4d         36  %    FL/BPD:     74.1   %    71 - 87  FL:       66.6  mm     G. Age:  34w 2d         44  %    FL/AC:      22.5   %    20 - 24  LV:        2.2  mm  Est. FW:    2364  gm      5 lb 3 oz     44  % ---------------------------------------------------------------------- OB History  Gravidity:    1 ---------------------------------------------------------------------- Gestational Age  LMP:           34w 1d        Date:  10/29/18                 EDD:   08/05/19  U/S Today:     34w 6d                                        EDD:   07/31/19  Best:          34w 1d     Det. By:  LMP  (10/29/18)          EDD:   08/05/19 ---------------------------------------------------------------------- Anatomy  Cranium:               Appears normal         Aortic Arch:            Previously seen  Cavum:                 Appears normal         Ductal Arch:            Not well visualized  Ventricles:            Appears normal         Diaphragm:              Appears normal  Choroid Plexus:        Previously seen        Stomach:                Appears normal, left  sided  Cerebellum:            Previously seen        Abdomen:                Appears normal  Posterior Fossa:       Previously seen        Abdominal Wall:         Not well visualized  Nuchal Fold:            Not applicable (>20    Cord Vessels:           Previously seen                         wks GA)  Face:                  Orbits and profile     Kidneys:                Appear normal                         previously seen  Lips:                  Previously seen        Bladder:                Appears normal  Thoracic:              Appears normal         Spine:                  Previously seen  Heart:                 Not well visualized    Upper Extremities:      Previously seen  RVOT:                  Previously seen        Lower Extremities:      Previously seen  LVOT:                  Previously seen  Other:  Technically difficult due to maternal habitus and fetal position. ---------------------------------------------------------------------- Cervix Uterus Adnexa  Cervix  Not visualized (advanced GA >24wks) ---------------------------------------------------------------------- Impression  Amniotic fluid is normal and good fetal activity is seen. Fetal  growth is appropriate for gestational age. 4-chamber view  could not be assessed because of fetal position. ---------------------------------------------------------------------- Recommendations  Follow-up scans as clinically indicated. ----------------------------------------------------------------------                  Noralee Space, MD Electronically Signed Final Report   06/25/2019 04:46 pm ----------------------------------------------------------------------   Assessment and Plan:  Pregnancy: G1P0 at [redacted]w[redacted]d 1. Hypertension during pregnancy in third trimester, unspecified hypertension in pregnancy type BP elevated today. No severe symptoms. Meets criteria for GHTN. Will directly admit and induce.   2. Supervision of high risk pregnancy in third trimester   Preterm labor symptoms and general obstetric precautions including but not limited to vaginal bleeding, contractions, leaking of fluid and fetal movement were reviewed in detail with the  patient. I discussed the assessment and treatment plan with the patient. The patient was provided an opportunity to ask questions and all were answered. The patient agreed with the plan and demonstrated an understanding of the instructions. The patient was advised to call  back or seek an in-person office evaluation/[facility] for any urgent or concerning symptoms. Please refer to After Visit Summary for other counseling recommendations.   I provided 11 minutes of face-to-face time during this encounter.  No follow-ups on file.  Future Appointments  Date Time Provider Department Center  07/23/2019  9:35 AM Noralee CharsStinson, Em[facility] Wurtz J[facility]  07/30/2019 10:35 AM Adrian BlackwaterStinson, Rhona RaiderJacob J, DO [facility]    Levie HeritageJacob J Cynthea Zachman, DO [facility]

## 2019-07-23 NOTE — Progress Notes (Signed)
VAST consulted to obtain IV access. Upon arrival to bedside, pt with IV access in place. Nurse reported she forgot to cancel consult.

## 2019-07-23 NOTE — Progress Notes (Signed)
I connected with  Alexandra Collins on 07/23/19 at 0850 by telephone and verified that I am speaking with the correct person using two identifiers.   I discussed the limitations, risks, security and privacy concerns of performing an evaluation and management service by telephone and the availability of in person appointments. I also discussed with the patient that there may be a patient responsible charge related to this service. The patient expressed understanding and agreed to proceed.  Marjo Bicker, RN 07/23/2019  8:50 AM\

## 2019-07-23 NOTE — Progress Notes (Signed)
Alexandra Collins is a 24 y.o. G1P0 at [redacted]w[redacted]d admitted for IOL 2/2 gHTN  Subjective: Feeling some cramping  Objective: BP 119/70   Pulse 69   Temp 98 F (36.7 C) (Oral)   Resp 15   Ht 5\' 6"  (1.676 m)   Wt (!) 148.8 kg   LMP 10/29/2018   BMI 52.94 kg/m  No intake/output data recorded.  FHT:  FHR: 120 bpm, variability: moderate,  accelerations:  Present,  decelerations:  Absent UC:   none  SVE:   Dilation: Fingertip Effacement (%): Thick Station: -3 Exam by:: H Carmelo Reidel DO  Labs: Lab Results  Component Value Date   WBC 6.9 07/23/2019   HGB 10.4 (L) 07/23/2019   HCT 32.1 (L) 07/23/2019   MCV 86.3 07/23/2019   PLT 430 (H) 07/23/2019    Assessment / Plan: Alexandra Collins is a 24 y.o. G1P0 at [redacted]w[redacted]d here for IOL 2/2 gestational hypertension  1. Labor: S/p cyto x 1. Cervix somewhat softer, only FT dilation. Will give another cytotec. Consider FB placement next check. 2. FWB: Cat I 3. Pain: per patient request 4. GBS: negative 5. Gestational hypertension: Pre-E labs normal. No severe range pressure, highest 130s systolic.  [redacted]w[redacted]d DO OB Fellow, Faculty Practice 07/23/2019, 5:54 PM

## 2019-07-24 LAB — RPR: RPR Ser Ql: NONREACTIVE

## 2019-07-24 MED ORDER — TERBUTALINE SULFATE 1 MG/ML IJ SOLN: 0.2500 mg | Freq: Once | INTRAMUSCULAR | Status: DC | PRN

## 2019-07-24 MED ORDER — OXYTOCIN 40 UNITS IN NORMAL SALINE INFUSION - SIMPLE MED
1.0000 m[IU]/min | INTRAVENOUS | Status: DC
  Administered 2019-07-24 – 2019-07-26 (×2): 2 m[IU]/min via INTRAVENOUS
  Filled 2019-07-24: qty 1000

## 2019-07-24 NOTE — Progress Notes (Signed)
Alexandra Collins is a 24 y.o. G1P0 at [redacted]w[redacted]d by ultrasound admitted for induction of labor due to Gestational hypertension.  Subjective: Breathing through contractions.  Doula and partner at bedside.   Desires low intervention labor and birth  Objective: BP (!) 142/75   Pulse 85   Temp (!) 97.5 F (36.4 C) (Oral)   Resp 18   Ht 5\' 6"  (1.676 m)   Wt (!) 148.8 kg   LMP 10/29/2018   BMI 52.94 kg/m  I/O last 3 completed shifts: In: 120 [P.O.:120] Out: -  No intake/output data recorded.  FHT:  FHR: 135 bpm, variability: moderate,  accelerations:  Present,  decelerations:  Absent UC:   regular, every 3-4 minutes SVE:   Dilation: 4 Effacement (%): 60 Station: -3 Exam by:: Melanie, CNM   Labs: Lab Results  Component Value Date   WBC 6.9 07/23/2019   HGB 10.4 (L) 07/23/2019   HCT 32.1 (L) 07/23/2019   MCV 86.3 07/23/2019   PLT 430 (H) 07/23/2019    Assessment / Plan: Induction of labor due to gestational hypertension,  progressing well on pitocin  Labor: Progressing normally and Progressing on Pitocin, will continue to increase then AROM Preeclampsia:  labs stable Fetal Wellbeing:  Category I Pain Control:  Labor support without medications I/D:  n/a Anticipated MOD:  NSVD  07/25/2019 07/24/2019, 8:48 PM

## 2019-07-24 NOTE — Progress Notes (Signed)
Labor Progress Note Anyely Cunning is a 24 y.o. G1P0 at [redacted]w[redacted]d presented for IOL for gHTN  S:  Feeling stronger ctx with breast pumping. Reports q3-5. Requesting cervical exam.   O:  BP (!) 150/88   Pulse 98   Temp 98.6 F (37 C) (Axillary)   Resp 18   Ht 5\' 6"  (1.676 m)   Wt (!) 148.8 kg   LMP 10/29/2018   BMI 52.94 kg/m  EFM: baseline 125 bpm/ mod variability/ + accels/ no decels  Toco/IUPC: indeterminable SVE: Dilation: 4 Effacement (%): 60 Cervical Position: Posterior Station: -3 Presentation: Vertex Exam by:: [email or web address], CNM   A/P: 24 y.o. G1P0 [redacted]w[redacted]d  1. Labor: latent 2. FWB: Cat I 3. Pain: labor support without meds, has doula 4. GHTN: stable  Pt previously preferred not to start Pitocin therefore will plan for another session of pumping and evaluate for progress. Will consider another discussion of Pitocin if no progression. Would not recommend AROM d/t fetal station and still in latent phase.   [redacted]w[redacted]d, CNM 2:25 PM

## 2019-07-24 NOTE — Progress Notes (Signed)
Labor Progress Note Alexandra Collins is a 24 y.o. G1P0 at [redacted]w[redacted]d presented for IOL for gHTN  S:  Feeling some ctx, breathing through them. Desires unmedicated labor and birth.  O:  BP 135/68   Pulse 75   Temp 98.1 F (36.7 C) (Oral)   Resp 18   Ht 5\' 6"  (1.676 m)   Wt (!) 148.8 kg   LMP 10/29/2018   BMI 52.94 kg/m  EFM: baseline 125 bpm/ mod variability/ + accels/ no decels  Toco/IUPC: irregular SVE: deferred  A/P: 24 y.o. G1P0 [redacted]w[redacted]d  1. Labor: latent 2. FWB: Cat I 3. Pain: labor support w/o meds 4. gHTN-stable  S/p Cytotec x5. Pt highly desires unmedicated birth therefore prefers not to proceed with Pitocin. Offered option of nipple stimulation 4 hrs after Cytotec. Pt agrees with this plan. Anticipate labor progress and SVD.  [redacted]w[redacted]d, CNM 8:49 AM

## 2019-07-24 NOTE — Progress Notes (Signed)
Alexandra Collins is a 24 y.o. G1P0 at [redacted]w[redacted]d admitted for IOL 2/2 gHTN.  Subjective: Feeling some cramping.   Objective: BP (!) 109/55   Pulse 75   Temp 97.9 F (36.6 C) (Oral)   Resp 18   Ht 5\' 6"  (1.676 m)   Wt (!) 148.8 kg   LMP 10/29/2018   BMI 52.94 kg/m  No intake/output data recorded.  FHT:  FHR: 135 bpm, variability: moderate,  accelerations:  Present,  decelerations:  Absent UC: unable to trace currently   SVE:   Dilation: Fingertip Effacement (%): Thick Station: -3 Exam by:: H Sparacino DO  Labs: Lab Results  Component Value Date   WBC 6.9 07/23/2019   HGB 10.4 (L) 07/23/2019   HCT 32.1 (L) 07/23/2019   MCV 86.3 07/23/2019   PLT 430 (H) 07/23/2019    Assessment / Plan: Alexandra Collins is a 24 y.o. G1P0 at [redacted]w[redacted]d here for IOL 2/2 gestational hypertension.  1. Labor: S/p cyto x 3. FB still in place. Will give 4th Cytotec. Anticipate SVD. 2. FWB: Cat I 3. Pain: per patient request 4. GBS: negative 5. Gestational hypertension: Pre-E labs normal. Mild range pressures thus far.   [redacted]w[redacted]d MD OB Fellow, Faculty Practice 07/24/2019, 2:29 AM

## 2019-07-24 NOTE — Progress Notes (Signed)
Irregular ctx on toco. Pt reports no change in ctx. Has more when she is standing/walking but then they decrease once she lies down. Declines cervical exam. Recommend Pitocin augmentation and pt agrees and ready to move things along.

## 2019-07-24 NOTE — Progress Notes (Signed)
Alexandra Collins is a 24 y.o. G1P0 at [redacted]w[redacted]d admitted for IOL 2/2 gHTN.  Subjective: Feeling some cramping. Would like to eat breakfast.  Objective: BP (!) 128/55   Pulse 79   Temp 98.1 F (36.7 C) (Oral)   Resp 18   Ht 5\' 6"  (1.676 m)   Wt (!) 148.8 kg   LMP 10/29/2018   BMI 52.94 kg/m  No intake/output data recorded.  FHT:  FHR: 130 bpm, variability: moderate,  accelerations:  Present,  decelerations:  Absent UC: unable to trace currently; q5-51m per patient   SVE:   Dilation: 4 Effacement (%): 50 Station: Ballotable Exam by:: Marckus Hanover  Labs: Lab Results  Component Value Date   WBC 6.9 07/23/2019   HGB 10.4 (L) 07/23/2019   HCT 32.1 (L) 07/23/2019   MCV 86.3 07/23/2019   PLT 430 (H) 07/23/2019    Assessment / Plan: Alexandra Collins is a 24 y.o. G1P0 at [redacted]w[redacted]d here for IOL 2/2 gestational hypertension.  1. Labor: S/p FB. Will give 5th Cytotec as patient would like to eat breakfast and still some thickness to cervix. AROM/Pit as indicated. Anticipate SVD. 2. FWB: Cat I 3. Pain: per patient request 4. GBS: negative 5. Gestational hypertension: Pre-E labs normal. Mild range pressures thus far.   [redacted]w[redacted]d MD OB Fellow, Faculty Practice 07/24/2019, 6:52 AM

## 2019-07-25 MED ORDER — BUTORPHANOL TARTRATE 1 MG/ML IJ SOLN
1.0000 mg | Freq: Once | INTRAMUSCULAR | Status: AC
  Administered 2019-07-25: 1 mg via INTRAVENOUS
  Filled 2019-07-25: qty 1

## 2019-07-25 MED ORDER — PROMETHAZINE HCL 25 MG/ML IJ SOLN
25.0000 mg | Freq: Once | INTRAMUSCULAR | Status: AC
  Administered 2019-07-25: 25 mg via INTRAVENOUS
  Filled 2019-07-25: qty 1

## 2019-07-25 NOTE — Progress Notes (Signed)
RN went into pts room to adjust [place] around 0400, pt and her husband asking questions about going home until in active labor. RN notified Artelia Laroche, CNM. Midwife reviewed strip and consulted MD. Dr. Adrian Blackwater at bedside around [phone] to discuss plan of care with pt. Pt agreered to stay. 20 minute NST will be done and then she will received stadol and phenergan with plans to rest after. MD will plan to reassess pt around 8am.  20 NST done and reactive.  Pt is currently eating meal, that was approved by Dr. Adrian Blackwater and after eating she will receive medication and try to rest.

## 2019-07-25 NOTE — Progress Notes (Signed)
Patient ID: Latifah Padin, female   DOB: 05-06-1996, 24 y.o.   MRN: [id number]  Labor Progress Note Jendaya Gossett is a 24 y.o. G1P0 at [redacted]w[redacted]d presented for IOL for gHTN  S: Patient was offered therapeutic rest with Dr. Adrian Blackwater and just woke. She is feeling better and was wanted to discuss the plan.  O:  BP 130/81   Pulse (!) 101   Temp 98.1 F (36.7 C) (Oral)   Resp 18   Ht 5\' 6"  (1.676 m)   Wt (!) 148.8 kg   LMP 10/29/2018   BMI 52.94 kg/m    Vitals:   07/25/19 0615 07/25/19 0905 07/25/19 1020 07/25/19 1150  BP: 117/64 (!) 103/58 (!) 119/52 130/81  Pulse: 88 94 72 (!) 101  Temp:  98.1 F (36.7 C)    Resp: 16 18    Height:      Weight:      TempSrc:  Oral    BMI (Calculated):        EFM: 120/mod/+accels, no decels  CVE: Dilation: 4 Effacement (%): 80 Cervical Position: Posterior Station: -2 Presentation: Vertex Exam by:: [email or web address], CNM    A&P: 24 y.o. G1P0 [redacted]w[redacted]d here for IOL for gHTN #Labor: Therapuetic rest from 4:30a-12p. Not having regular contractions at this point. Discussed a plan of care going forward in detail. Given maternal and infant stability and the need to support patient autonomy we jointly agreed to:   1) patient will eat/shower  2) Nipple stimulation with breast pump  3) If not having regular ctx every 5 min after nipple stimulation,  patient agrees to AROM. Discussed risk of infection after 18hrs  4) If not having regular strong contraction 4 hours after ROM,  patient agrees to restart pitocin  #Pain: prn Fentanyl and epidural at request #FWB: Cat I #GBS negative  #gHTN: BP are WNL currently. Will continue to to monitor closely. Reinforced and patient voiced understanding the need for IOL and risk of gHTN.   [redacted]w[redacted]d, MD 12:55 PM

## 2019-07-25 NOTE — Progress Notes (Signed)
Patient intermittently using nipple stimulation.

## 2019-07-25 NOTE — Progress Notes (Signed)
Patient ID: Alexandra Collins, female   DOB: Dec 15, 1995, 24 y.o.   MRN: [id number] Initial note and cervix check done at 0220 Having a hard time with contractions  Vitals:   07/25/19 0000 07/25/19 0030 07/25/19 0106 07/25/19 0130  BP: (!) 146/78 (!) 151/85 (!) 143/84 106/70  Pulse: 94 79 88 99  Resp: 18 18  18   Temp: 98.1 F (36.7 C)     TempSrc: Oral     Weight:      Height:       Fetal heart rate difficult to trace Generally reassuring UCs every 2-3 min  Dilation: 4 Effacement (%): 80 Cervical Position: Posterior Station: -2 Presentation: Vertex Exam by:: [email or web address], CNM   Discussed options Offered AROM with internals to optimize monitoring and perhaps enhance labor progress Offered analgesia, declines She requested Pitocin to be turned off so she can rest.  WIll try this and restart in one hour  Current update: Pitocin has been off and contractions decreased Now requesting to turn everything off and go home Consulted Dr Artelia Laroche:

## 2019-07-25 NOTE — Progress Notes (Signed)
Patient ID: [name], female   DOB: 09-20-1995, 24 y.o.   MRN: [id number]   Labor Progress Note Alexandra Collins is a 24 y.o. G1P0 at [redacted]w[redacted]d presented for IOL for gHTN  S: Patient has done some nipple stimulation which caused every 10 min contractions. These stopped when breast pumped removed   O:  BP (!) 151/91   Pulse 89   Temp 98.1 F (36.7 C) (Oral)   Resp 18   Ht 5\' 6"  (1.676 m)   Wt (!) 148.8 kg   LMP 10/29/2018   BMI 52.94 kg/m  FHT: 120s, accel seen.    CVE: Dilation: 4 Effacement (%): 80 Cervical Position: Posterior Station: -2 Presentation: Vertex Exam by:: [email or web address], CNM    A&P: 24 y.o. G1P0 [redacted]w[redacted]d IOL for GHT #Labor: Did not check cervix given lack of contractions. Discussed with patient plan for AROM. She would like to try nipple stimulation for 1 more hour.   Plan for cervical exam and AROM at that time (~7pm).   She does not tolerate vaginal exams and desires fentanyl prior.  We discussed again that after AROM if she does not have contractions every 5 min after 4 hours I would recommend starting Pitocin to reduce infection risk.   We has a discussion about expectations in labor. Reviewed that the time it takes to change from 4-6cm is variable in most women and that after 6 cm she may only change 1cm every 2-3 hours and this normal for a first time mom. Reviewed this means 2-3 hrs of strong regular (every 2-5 min) for 2 hours for change to occur. Patient currently continues to endorse resistance of epidural but has set up a safe word with her partner and doula if she feels she needs the epidural.   Gave sign out to team about plan of care  #Pain: Fentanyl with AROM, Epidural PRN #FWB: Reassuring FHT #GBS negative   #gHTN: Occasional elevated BP. While in room BP was 120s/80.  All elevated BPs have been mild range. No sx currently. No need for IV medication management.   [redacted]w[redacted]d, MD 6:04 PM

## 2019-07-25 NOTE — Progress Notes (Addendum)
Patient ID: Alexandra Collins, female   DOB: 10-Aug-1995, 24 y.o.   MRN: [id number] Dr Adrian Blackwater in to talk with patient at about 0430  She agrees to stay  They will do a NST and then she will get Stadol and Phenergan for sedation and analgesia  NST reactive FHR 120 with accels to 150, average variability UCs every 4-6 min  Dr Adrian Blackwater will see her at about 7-8am and offer AROM and restart of pitocin  MD to follow

## 2019-07-25 NOTE — Progress Notes (Addendum)
Philipp Deputy, CNM at bedside around 2015 to discuss plan of care with pt, husband and doula. Plan is to continue intermittently monitoring pt. She will eat a meal in the next 15 minutes. Then IV pain medication will be given to make cervical exam and ROM tolerable for pt.

## 2019-07-26 ENCOUNTER — Inpatient Hospital Stay (HOSPITAL_COMMUNITY): Payer: BC Managed Care – PPO | Admitting: Anesthesiology

## 2019-07-26 ENCOUNTER — Encounter (HOSPITAL_COMMUNITY): Payer: Self-pay | Admitting: Obstetrics & Gynecology

## 2019-07-26 DIAGNOSIS — O133 Gestational [pregnancy-induced] hypertension without significant proteinuria, third trimester: Secondary | ICD-10-CM

## 2019-07-26 DIAGNOSIS — Z3A38 38 weeks gestation of pregnancy: Secondary | ICD-10-CM

## 2019-07-26 LAB — CBC
HCT: 32 % — ABNORMAL LOW (ref 36.0–46.0)
Hemoglobin: 10.6 g/dL — ABNORMAL LOW (ref 12.0–15.0)
MCH: 28.8 pg (ref 26.0–34.0)
MCHC: 33.1 g/dL (ref 30.0–36.0)
MCV: 87 fL (ref 80.0–100.0)
Platelets: 443 10*3/uL — ABNORMAL HIGH (ref 150–400)
RBC: 3.68 MIL/uL — ABNORMAL LOW (ref 3.87–5.11)
RDW: 13.3 % (ref 11.5–15.5)
WBC: 7.4 10*3/uL (ref 4.0–10.5)
nRBC: 0 % (ref 0.0–0.2)

## 2019-07-26 MED ORDER — SIMETHICONE 80 MG PO CHEW
80.0000 mg | CHEWABLE_TABLET | ORAL | Status: DC | PRN
  Filled 2019-07-26: qty 1

## 2019-07-26 MED ORDER — PHENYLEPHRINE 40 MCG/ML (10ML) SYRINGE FOR IV PUSH (FOR BLOOD PRESSURE SUPPORT): 80.0000 ug | PREFILLED_SYRINGE | INTRAVENOUS | Status: DC | PRN

## 2019-07-26 MED ORDER — SENNOSIDES-DOCUSATE SODIUM 8.6-50 MG PO TABS
2.0000 | ORAL_TABLET | ORAL | Status: DC
  Administered 2019-07-26 – 2019-07-28 (×3): 2 via ORAL
  Filled 2019-07-26 (×3): qty 2

## 2019-07-26 MED ORDER — CYCLOBENZAPRINE HCL 5 MG PO TABS
5.0000 mg | ORAL_TABLET | Freq: Three times a day (TID) | ORAL | Status: DC | PRN
  Administered 2019-07-26: 5 mg via ORAL
  Filled 2019-07-26 (×2): qty 1

## 2019-07-26 MED ORDER — PRENATAL MULTIVITAMIN CH
1.0000 | ORAL_TABLET | Freq: Every day | ORAL | Status: DC
  Administered 2019-07-28 – 2019-07-29 (×2): 1 via ORAL
  Filled 2019-07-26 (×3): qty 1

## 2019-07-26 MED ORDER — EPHEDRINE 5 MG/ML INJ: 10.0000 mg | INTRAVENOUS | Status: DC | PRN

## 2019-07-26 MED ORDER — BUPIVACAINE HCL (PF) 0.25 % IJ SOLN
INTRAMUSCULAR | Status: DC | PRN
  Administered 2019-07-26: 1 mL via INTRATHECAL

## 2019-07-26 MED ORDER — TETANUS-DIPHTH-ACELL PERTUSSIS 5-2.5-18.5 LF-MCG/0.5 IM SUSP: 0.5000 mL | Freq: Once | INTRAMUSCULAR | Status: DC

## 2019-07-26 MED ORDER — FENTANYL CITRATE (PF) 100 MCG/2ML IJ SOLN
INTRAMUSCULAR | Status: DC | PRN
  Administered 2019-07-26: 15 ug via INTRATHECAL

## 2019-07-26 MED ORDER — WITCH HAZEL-GLYCERIN EX PADS: 1.0000 "application " | MEDICATED_PAD | CUTANEOUS | Status: DC | PRN

## 2019-07-26 MED ORDER — IBUPROFEN 600 MG PO TABS
600.0000 mg | ORAL_TABLET | Freq: Four times a day (QID) | ORAL | Status: DC
  Administered 2019-07-26 – 2019-07-29 (×12): 600 mg via ORAL
  Filled 2019-07-26 (×12): qty 1

## 2019-07-26 MED ORDER — OXYCODONE HCL 5 MG PO TABS
5.0000 mg | ORAL_TABLET | ORAL | Status: DC | PRN
  Administered 2019-07-26 – 2019-07-27 (×4): 5 mg via ORAL
  Administered 2019-07-27: 10 mg via ORAL
  Administered 2019-07-27: 5 mg via ORAL
  Administered 2019-07-27 – 2019-07-29 (×9): 10 mg via ORAL
  Filled 2019-07-26: qty 1
  Filled 2019-07-26 (×3): qty 2
  Filled 2019-07-26: qty 1
  Filled 2019-07-26 (×4): qty 2
  Filled 2019-07-26: qty 1
  Filled 2019-07-26 (×5): qty 2

## 2019-07-26 MED ORDER — ONDANSETRON HCL 4 MG PO TABS
4.0000 mg | ORAL_TABLET | ORAL | Status: DC | PRN
  Administered 2019-07-28 – 2019-07-29 (×2): 4 mg via ORAL
  Filled 2019-07-26 (×2): qty 1

## 2019-07-26 MED ORDER — LACTATED RINGERS IV SOLN: 500.0000 mL | Freq: Once | INTRAVENOUS | Status: DC

## 2019-07-26 MED ORDER — BENZOCAINE-MENTHOL 20-0.5 % EX AERO
1.0000 "application " | INHALATION_SPRAY | CUTANEOUS | Status: DC | PRN
  Administered 2019-07-26: 1 via TOPICAL
  Filled 2019-07-26: qty 56

## 2019-07-26 MED ORDER — COCONUT OIL OIL
1.0000 "application " | TOPICAL_OIL | Status: DC | PRN
  Administered 2019-07-27: 1 via TOPICAL

## 2019-07-26 MED ORDER — SODIUM CHLORIDE (PF) 0.9 % IJ SOLN
INTRAMUSCULAR | Status: DC | PRN
  Administered 2019-07-26: 1 mL/h via EPIDURAL

## 2019-07-26 MED ORDER — FENTANYL-BUPIVACAINE-NACL 0.5-0.125-0.9 MG/250ML-% EP SOLN
12.0000 mL/h | EPIDURAL | Status: DC | PRN
  Filled 2019-07-26: qty 250

## 2019-07-26 MED ORDER — DIBUCAINE (PERIANAL) 1 % EX OINT: 1.0000 "application " | TOPICAL_OINTMENT | CUTANEOUS | Status: DC | PRN

## 2019-07-26 MED ORDER — DIPHENHYDRAMINE HCL 25 MG PO CAPS: 25.0000 mg | ORAL_CAPSULE | Freq: Four times a day (QID) | ORAL | Status: DC | PRN

## 2019-07-26 MED ORDER — ACETAMINOPHEN 325 MG PO TABS
650.0000 mg | ORAL_TABLET | ORAL | Status: DC | PRN
  Administered 2019-07-26 – 2019-07-29 (×9): 650 mg via ORAL
  Filled 2019-07-26 (×11): qty 2

## 2019-07-26 MED ORDER — ZOLPIDEM TARTRATE 5 MG PO TABS: 5.0000 mg | ORAL_TABLET | Freq: Every evening | ORAL | Status: DC | PRN

## 2019-07-26 MED ORDER — DIPHENHYDRAMINE HCL 50 MG/ML IJ SOLN
12.5000 mg | INTRAMUSCULAR | Status: DC | PRN
  Administered 2019-07-26: 12.5 mg via INTRAVENOUS
  Filled 2019-07-26: qty 1

## 2019-07-26 MED ORDER — ONDANSETRON HCL 4 MG/2ML IJ SOLN: 4.0000 mg | INTRAMUSCULAR | Status: DC | PRN

## 2019-07-26 NOTE — Anesthesia Preprocedure Evaluation (Signed)
Anesthesia Evaluation  Patient identified by MRN, date of birth, ID band Patient awake    Reviewed: Allergy & Precautions, Patient's Chart, lab work & pertinent test results  History of Anesthesia Complications Negative for: history of anesthetic complications  Airway Mallampati: III  TM Distance: >3 FB Neck ROM: Full    Dental no notable dental hx.    Pulmonary neg pulmonary ROS,    Pulmonary exam normal        Cardiovascular hypertension (gestational), Normal cardiovascular exam     Neuro/Psych negative neurological ROS  negative psych ROS   GI/Hepatic negative GI ROS, Neg liver ROS,   Endo/Other  Morbid obesity  Renal/GU negative Renal ROS  negative genitourinary   Musculoskeletal negative musculoskeletal ROS (+)   Abdominal   Peds  Hematology  (+) anemia , Hgb 10.6   Anesthesia Other Findings Day of surgery medications reviewed with patient.  Reproductive/Obstetrics (+) Pregnancy                             Anesthesia Physical Anesthesia Plan  ASA: III  Anesthesia Plan: Epidural   Post-op Pain Management:    Induction:   PONV Risk Score and Plan: Treatment may vary due to age or medical condition  Airway Management Planned: Natural Airway  Additional Equipment:   Intra-op Plan:   Post-operative Plan:   Informed Consent: I have reviewed the patients History and Physical, chart, labs and discussed the procedure including the risks, benefits and alternatives for the proposed anesthesia with the patient or authorized representative who has indicated his/her understanding and acceptance.       Plan Discussed with:   Anesthesia Plan Comments:         Anesthesia Quick Evaluation

## 2019-07-26 NOTE — Progress Notes (Signed)
Patient ID: Alexandra Collins, female   DOB: [birth year], 24 y.o.   MRN: [id number]  Comfortable with epidural (placed intrathecally); Pit started at 0200  BP 142/87, 141/78 FHR 125, +accels, no decels Ctx q 1-3 mins with Pit at 66mu/min Cx 8/C/vtx 0  IUP@38 .4wks gHTN- BPs stable Active labor  Plan to check cx in 2 hrs or sooner prn strong urge to push Anticipate vag del  [name] [facility] 07/26/2019

## 2019-07-26 NOTE — Discharge Summary (Signed)
Postpartum Discharge Summary    Patient Name: Alexandra Collins DOB: 1995-11-26 MRN: [id number]  Date of admission: 07/23/2019 Delivering Provider: [place]   Date of discharge: 07/29/2019  Admitting diagnosis: Encounter for induction of labor [Z34.90] Intrauterine pregnancy: [redacted]w[redacted]d    Secondary diagnosis:  Active Problems:   Maternal morbid obesity, antepartum ([place]   Supervision of high-risk pregnancy   Gestational hypertension   Encounter for induction of labor   Spinal headache  Additional problems: None     Discharge diagnosis: Term Pregnancy Delivered and Gestational Hypertension                                                                                                Post partum procedures:NA  Augmentation: AROM, Pitocin, Cytotec and Foley Balloon  Complications: None  Hospital course:  Induction of Labor With Vaginal Delivery   24y.o. yo G1P1001 at 318w4das admitted to the hospital 07/23/2019 for induction of labor.  Indication for induction: Gestational hypertension.  Patient had an uncomplicated labor course as follows: Membrane Rupture Time/Date: 10:19 PM ,07/25/2019   Intrapartum Procedures: Episiotomy: None [1]                                         Lacerations:  Sulcus [9]  Patient had delivery of a Viable infant.  Information for the patient's newborn:  WiRayn, Shorbirl JaCyndra0[[id number]]Delivery Method: Vaginal, Spontaneous(Filed from Delivery Summary)    07/26/2019  Details of delivery can be found in separate delivery note.    Patient postpartum course notable for spinal headache. She received intrathecal catheter during labor due to technical difficulties of placing epidural w high BMI, after delivery had spinal HA and received blood patch which improved headache.  Postpartum BP's were normotensive and she was discharged on no medications and had a BP check scheduled for 08/03/2019 on day of discharge.   Patient is discharged home  07/29/19. Delivery time: 11:34 AM    Magnesium Sulfate received: No BMZ received: No Rhophylac:No MMR:No Transfusion:No  Physical exam  Vitals:   07/29/19 0758 07/29/19 0935 07/29/19 0956 07/29/19 1015  BP: (!) 122/58 (!) 147/76 (!) 125/56 126/60  Pulse: 63 72 81 63  Resp: 14     Temp: 98 F (36.7 C)     TempSrc: Oral     SpO2: 100% 97% 99% 96%  Weight:      Height:       General: alert, cooperative and no distress Lochia: appropriate Uterine Fundus: firm Incision: N/A DVT Evaluation: Mild bilateral calf/ankle edema present Labs: Lab Results  Component Value Date   WBC 7.4 07/26/2019   HGB 10.6 (L) 07/26/2019   HCT 32.0 (L) 07/26/2019   MCV 87.0 07/26/2019   PLT 443 (H) 07/26/2019   CMP Latest Ref Rng & Units 07/23/2019  Glucose 70 - 99 mg/dL 108(H)  BUN 6 - 20 mg/dL 5(L)  Creatinine 0.44 - 1.00 mg/dL 0.70  Sodium 135 - 145 mmol/L 136  Potassium 3.5 -  5.1 mmol/L 3.5  Chloride 98 - 111 mmol/L 104  CO2 22 - 32 mmol/L 22  Calcium 8.9 - 10.3 mg/dL 8.7(L)  Total Protein 6.5 - 8.1 g/dL 6.1(L)  Total Bilirubin 0.3 - 1.2 mg/dL 0.5  Alkaline Phos 38 - 126 U/L 89  AST 15 - 41 U/L 18  ALT 0 - 44 U/L 15   Edinburgh Score: Edinburgh Postnatal Depression Scale Screening Tool 07/26/2019  I have been able to laugh and see the funny side of things. (No Data)    Discharge instruction: per After Visit Summary and "Baby and Me Booklet".  After visit meds:  Allergies as of 07/29/2019      Reactions   Ceclor [cefaclor] Hives   Garlic Hives   Only allergic to raw garlic   Penicillins Hives   Has patient had a PCN reaction causing immediate rash, facial/tongue/throat swelling, SOB or lightheadedness with hypotension: Yes Has patient had a PCN reaction causing severe rash involving mucus membranes or skin necrosis: Yes Has patient had a PCN reaction that required hospitalization: No Has patient had a PCN reaction occurring within the last 10 years: No If all of the above  answers are "NO", then may proceed with Cephalosporin use.   Shellfish Allergy Itching   Sulfamethoxazole Nausea And Vomiting   Sulfur Hives   Amoxicillin-pot Clavulanate Rash      Medication List    STOP taking these medications   aspirin EC 81 MG tablet     TAKE these medications   acetaminophen 325 MG tablet Commonly known as: Tylenol Take 2 tablets (650 mg total) by mouth every 4 (four) hours as needed (for pain scale < 4).   albuterol 108 (90 Base) MCG/ACT inhaler Commonly known as: VENTOLIN HFA Inhale 2 puffs into the lungs every 6 (six) hours as needed for wheezing or shortness of breath.   calcium carbonate 500 MG chewable tablet Commonly known as: TUMS - dosed in mg elemental calcium Chew 2 tablets by mouth 2 (two) times daily as needed for indigestion or heartburn.   doxylamine (Sleep) 25 MG tablet Commonly known as: UNISOM Take 1 tablet (25 mg total) by mouth at bedtime as needed.   EPINEPHrine 0.3 mg/0.3 mL Soaj injection Commonly known as: EPI-PEN Inject 0.3 mg into the muscle as needed for anaphylaxis.   ibuprofen 600 MG tablet Commonly known as: ADVIL Take 1 tablet (600 mg total) by mouth every 6 (six) hours.   polyethylene glycol powder 17 GM/SCOOP powder Commonly known as: GLYCOLAX/MIRALAX Take 17 g by mouth daily.   prenatal multivitamin Tabs tablet Take 1 tablet by mouth daily at 12 noon.       Diet: routine diet  Activity: Advance as tolerated. Pelvic rest for 6 weeks.   Outpatient follow up:6 weeks Follow up Appt: Future Appointments  Date Time Provider [place]  08/03/2019  8:30 AM [place]  08/24/2019  9:15 AM Hillard Danker Myles Rosenthal, PA-C [facility]   Follow up Visit:    Please schedule this patient for Postpartum visit in: 6 weeks with the following provider: APP Virtual For C/S patients schedule nurse incision check in weeks 2 weeks: no High risk pregnancy complicated by: HTN Delivery mode:   SVD Anticipated Birth Control:  POPs PP Procedures needed: BP check in one week  Schedule Integrated BH visit: no     Newborn Data: Live born female  Birth Weight:  3116 grams APGAR: 9, 9  Newborn Delivery   Birth date/time: 07/26/2019  11:34:00 Delivery type: Vaginal, Spontaneous      Baby Feeding: Breast Disposition:home with mother   07/29/2019 Clarnce Flock, MD

## 2019-07-26 NOTE — Lactation Note (Signed)
This note was copied from a baby's chart. Lactation Consultation Note  Patient Name: Alexandra Collins [id number] Date: 07/26/2019 Reason for consult: Initial assessment;1st time breastfeeding;Early term 37-38.6wks P1, 7 hour ETI female infant. Per mom, infant only latched in recovery briefly and has not breastfed in the past 6 hours. Infant had 2 voids and 3 stools, LC changed one void and one stool while in room. Tools given: hand pump and breast shells due to mom having flat nipples, mom's nipples responds well to breast stimulation a NS is not need. LC ask mom to do breast stimulation prior to latching infant on left breast using the football hold, infant latched but was reluctant to suckle at breast, only held breast in mouth at this time. LC discussed hand expression, mom did hand expression as well as dad assisting mom, mom expressed 4 mls of colostrum that was spoon feed to infant. Parents understand that mom will breastfeed infant according to hunger cues, 8-12 times within 24 hours, and not to exceed 3 hours without feeding infant. LC explain this is not uncommon behavior  If infant is sleepy and reluctant to latch  within first 24 hours of life, mom will hand express and give infant EBM on a spoon and do as much STS as possible. Mom knows to call RN or LC if she needs assistance with latching infant at breast. Mom knows to wear breast shells in bra during the day and not at night. Mom shown how to use hand pump & how to disassemble, clean, & reassemble parts. Reviewed Baby & Me book's Breastfeeding Basics.  LC discussed breastfeeding resources in the community: [facility] hotline, [facility] and [facility] online breastfeeding support group.   Maternal Data Formula Feeding for Exclusion: No Has patient been taught Hand Expression?: Yes Does the patient have breastfeeding experience prior to this delivery?: No  Feeding Feeding Type: Breast Fed  LATCH Score Latch: Too sleepy or  reluctant, no latch achieved, no sucking elicited.  Audible Swallowing: None  Type of Nipple: Flat  Comfort (Breast/Nipple): Soft / non-tender  Hold (Positioning): Assistance needed to correctly position infant at breast and maintain latch.  LATCH Score: 4  Interventions Interventions: Breast feeding basics reviewed;Breast compression;Adjust position;Assisted with latch;Skin to skin;Support pillows;Hand pump;Position options;Breast massage;Hand express;Expressed milk;Pre-pump if needed;Shells  Lactation Tools Discussed/Used Tools: Shells;Pump Shell Type: Other (comment)(flat) Breast pump type: Manual WIC Program: No Pump Review: Setup, frequency, and cleaning;Milk Storage Initiated by:: Danelle Earthly, IBCLC Date initiated:: 07/26/19   Consult Status Consult Status: Follow-up Date: 07/27/19 Follow-up type: In-patient    Danelle Earthly 07/26/2019, 8:31 PM

## 2019-07-26 NOTE — Progress Notes (Signed)
Patient ID: Alexandra Collins, female   DOB: October 31, 1995, 24 y.o.   MRN: [id number]  Recently rec'd Stadol/Phen as she requested prior to AROM; she is feeling sleepy  BPs 114/56, 117/60 FHR 125-130, +accels, no decels with intermittent FM Ctx irreg Cx 3+/70/vtx -3; AROM for clear fluid  IUP@38 .4wks gHTN- BPs stable IOL process  Expectant management until 0230, then plan Pitocin unless in active labor  [name] 07/25/2019

## 2019-07-26 NOTE — Anesthesia Procedure Notes (Signed)
Epidural Patient location during procedure: OB Start time: 07/26/2019 1:32 AM End time: 07/26/2019 1:37 AM  Staffing Anesthesiologist: Kaylyn Layer, MD Performed: anesthesiologist   Preanesthetic Checklist Completed: patient identified, IV checked, risks and benefits discussed, monitors and equipment checked, pre-op evaluation and timeout performed  Epidural Patient position: sitting Prep: DuraPrep and site prepped and draped Patient monitoring: continuous pulse ox, blood pressure and heart rate Approach: midline Location: L3-L4 Injection technique: LOR air  Needle:  Needle type: Tuohy  Needle gauge: 17 G Needle length: 15 cm Catheter type: closed end flexible Catheter size: 19 Gauge  Assessment Events: blood not aspirated, injection not painful, no injection resistance, no paresthesia and negative IV test  Additional Notes Patient identified. Risks, benefits, and alternatives discussed with patient including but not limited to bleeding, infection, nerve damage, paralysis, failed block, incomplete pain control, headache, blood pressure changes, nausea, vomiting, reactions to medication, itching, and postpartum back pain. Confirmed with bedside nurse the patient's most recent platelet count. Confirmed with patient that they are not currently taking any anticoagulation, have any bleeding history, or any family history of bleeding disorders. Patient expressed understanding and wished to proceed. All questions were answered. Sterile technique was used throughout the entire procedure. Please see nursing notes for vital signs.   Three needle redirections required to access interspinous space. LOR at 14cm but unable to thread catheter. Advanced slightly and catheter still unable to be advanced. Intentional dural puncture with Tuohy with clear CSF return. Catheter threaded into intrathecal space, clear CSF aspirated once catheter secured at 18cm at skin. Intrathecal medications given as  charted. No infusion started at this time. Warning signs of high block given to the patient including shortness of breath, tingling/numbness in hands, complete motor block, or any concerning symptoms with instructions to call for help. Patient was given instructions on fall risk and not to get out of bed. All questions and concerns addressed with instructions to call with any issues or inadequate analgesia.    **INTRATHECAL CATHETER**Reason for block:procedure for pain

## 2019-07-27 MED ORDER — BUTALBITAL-APAP-CAFFEINE 50-325-40 MG PO TABS
1.0000 | ORAL_TABLET | ORAL | Status: AC | PRN
  Administered 2019-07-27 – 2019-07-28 (×7): 1 via ORAL
  Filled 2019-07-27 (×8): qty 1

## 2019-07-27 NOTE — Lactation Note (Signed)
This note was copied from a baby's chart. Lactation Consultation Note  Patient Name: Alexandra Collins [id number] Date: 07/27/2019 Reason for consult: Follow-up assessment;Primapara;Mother's request;1st time breastfeeding;Early term 82-38.6wks  Visited with mom of a 13 hours old ETI female, she's still having a headache but not as bad as before. She requested lactation because she wanted to start pumping, when [facility] offered assistance with latch mom said she can't move her neck yet and all she can do for now is pumping, praised her for her efforts.  Dad has been helping with bottle feedings of Similac 20 calorie formula, but they haven't been following formula supplementation guidelines, baby has been given up to 30 ml of formula within the first 24 hours of life, parents told LC that baby threw up. Explained to parents about the small size of baby's stomach and that it can only hold so much; discussed formula supplementation guidelines when feeding babies 100% formula, parents voiced understanding.  LC set up a DEBP and mom started pumping during [facility] consultation. Dad aware to assist mom in case she get any colostrum with pumping sessions, but they're also aware that pumping early on is mainly for breast stimulation and that they may not get volume. Noticed that mom has flat nipples and wide spaced breasts, explained to mom that double pumping is also going to help evert her nipples, she also has breast shells in the room. Reviewed normal newborn behavior, feeding cues and cluster feeding.  Feeding plan:  1. Encouraged mom to pump every 3 hours, ideally 8 pumping sessions/24 hours 2. Dad will continue supplementing baby with Similac 20 calorie count formula according to formula supplementation guidelines until mom can start moving her neck again 3. Parents will call her RN/LC for assistance when mom is ready to take baby to breast  Parents reported all questions and concerns were answered, they're both  aware of LC OP services and will call PRN.  Maternal Data    Feeding Feeding Type: Bottle Fed - Formula  LATCH Score                   Interventions Interventions: Breast feeding basics reviewed;DEBP  Lactation Tools Discussed/Used Tools: Pump Breast pump type: Double-Electric Breast Pump Pump Review: Setup, frequency, and cleaning Initiated by:: MPeck Date initiated:: 07/27/19   Consult Status Consult Status: Follow-up Date: 07/28/19 Follow-up type: In-patient    Lileigh Fahringer Venetia Constable 07/27/2019, 4:49 PM

## 2019-07-27 NOTE — Progress Notes (Signed)
POSTPARTUM PROGRESS NOTE  Subjective: Alexandra Collins is a 24 y.o. G1P1001 PPD#1 s/p vaginal delivery at [redacted]w[redacted]d.  She reports she doing well. No acute events overnight. She denies any problems with ambulating, voiding or po intake. Denies nausea or vomiting. She has  passed flatus. Uterine/vaginal pain is well controlled.  Lochia is appropriate. She does have back pain from her intrathecal, which anesthesia is managing, it does limit her mobility.  Objective: Blood pressure 128/66, pulse 75, temperature 97.8 F (36.6 C), temperature source Oral, resp. rate 17, height 5\' 6"  (1.676 m), weight (!) 148.8 kg, last menstrual period 10/29/2018, SpO2 98 %, unknown if currently breastfeeding.  Physical Exam:  General: alert, cooperative and no distress Chest: no respiratory distress Abdomen: soft, non-tender  Uterine Fundus: firm, appropriately tender Extremities: No calf swelling or tenderness  No edema  Recent Labs    07/26/19 0047  HGB 10.6*  HCT 32.0*    Assessment/Plan: Alexandra Collins is a 24 y.o. G1P1001 PPD#1 s/p vaginal delivery at [redacted]w[redacted]d.  Routine Postpartum Care: Doing well, pain well-controlled.  -- Continue routine care, lactation support  -- Contraception: POPs -- Feeding: breast  Dispo: Plan for discharge tomorrow.  [redacted]w[redacted]d, DO OB/GYN Fellow, [facility] for [facility]

## 2019-07-27 NOTE — Lactation Note (Signed)
This note was copied from a baby's chart. Lactation Consultation Note  Patient Name: Alexandra Collins'T Date: 07/27/2019 Reason for consult: Follow-up assessment;Difficult latch;Primapara;Early term 37-38.6wks  Attempted to visit with mom but staff was in the room removing her catheter, mom unavailable for [facility] consultation, will check later.  Maternal Data    Feeding Feeding Type: Bottle Fed - Formula  LATCH Score                   Interventions    Lactation Tools Discussed/Used     Consult Status Consult Status: Follow-up Date: 07/28/19 Follow-up type: In-patient    Alexandra Collins 07/27/2019, 4:26 PM

## 2019-07-27 NOTE — Progress Notes (Signed)
Patient was complaining of a severe headache when trying to move in bed and stand up. Notified OBCRNA of patients positional headache. Will continue to monitor.

## 2019-07-27 NOTE — Lactation Note (Signed)
This note was copied from a baby's chart. Lactation Consultation Note  Patient Name: Alexandra Collins [id number] Date: 07/27/2019 Reason for consult: Follow-up assessment;Difficult latch;Primapara;Early term 40-38.6wks Baby is 26 hours old/3% weight loss.  Mom states she has not tried to latch baby today because she was not feeling well.  She is beginning to feel better and plans on attempting to latch baby after she eats.  Baby has been receiving formula.  Instructed to call for [facility] assist.  Maternal Data    Feeding    LATCH Score                   Interventions    Lactation Tools Discussed/Used     Consult Status Consult Status: Follow-up Date: 07/28/19 Follow-up type: In-patient    Huston Foley 07/27/2019, 1:58 PM

## 2019-07-27 NOTE — Anesthesia Postprocedure Evaluation (Signed)
Anesthesia Post Note  Patient: Alexandra Collins  Procedure(s) Performed: AN AD HOC LABOR EPIDURAL     Patient location during evaluation: Mother Baby Anesthesia Type: Epidural Level of consciousness: awake and alert Pain management: pain level controlled Vital Signs Assessment: post-procedure vital signs reviewed and stable Respiratory status: spontaneous breathing, nonlabored ventilation and respiratory function stable Cardiovascular status: stable Postop Assessment: no headache, no backache and epidural receding Anesthetic complications: yes Anesthetic complication details: spinal headacheComments: Intrathecal catheter removed at 1200 on 3/19 by MDA. Tip intact. Patient complaining of back pain and headache, worse when standing. Patient with known intrathecal catheter.  Discussed pain management options including conservative management, fioricet PRN, and aggressive hydration.  Discussed that epidural blood patch may be indicated, but that we typically wait 72 hours post-delivery (07/29/19 ~12pm) for that procedure.  Will continue to follow while in-patient.  Rondall Allegra, MD    Last Vitals:  Vitals:   07/27/19 0136 07/27/19 0556  BP: 128/70 128/66  Pulse: 69 75  Resp: 18 17  Temp: 36.4 C 36.6 C  SpO2: 100% 98%    Last Pain:  Vitals:   07/27/19 1110  TempSrc:   PainSc: 5    Pain Goal: Patients Stated Pain Goal: 3 (07/26/19 1930)                 Cecile Hearing

## 2019-07-27 NOTE — Progress Notes (Signed)
Since Anesth MD pulled intrathecal catheter, pt is progressing well and has been OOB to bathroom.  Peri Care done and pads changed.  Will d/c foley in the next hour.  Pt states she became a bit dizzy and RN's voice was "muffled" while ambulating.  Also, headache did not persist and back/neck pain were moderate.  Compared to other times that she was OOB, this time was "the best and much better".  Will continue to monitor.

## 2019-07-28 NOTE — Progress Notes (Addendum)
POSTPARTUM PROGRESS NOTE  Post Partum Day 2  Subjective:  Regis Wiland is a 24 y.o. G1P1001 s/p SVD at [redacted]w[redacted]d. Her back pain has resolved since removal of the intrathecal catheter. She does complain of a headache, mostly associated with neck pain and position changes. Fioricet has helped some. No acute events overnight. She says the pain makes her dizzy when she stands. She denies any problems with voiding or po intake. Denies nausea or vomiting.  Abdominal and vaginal pain are well controlled.  Lochia is appropriate.  Objective: Blood pressure (!) 111/49, pulse (!) 58, temperature (!) 97.5 F (36.4 C), temperature source Oral, resp. rate 18, height 5\' 6"  (1.676 m), weight (!) 148.8 kg, last menstrual period 10/29/2018, SpO2 100 %, unknown if currently breastfeeding.  Physical Exam:  General: alert, cooperative and no distress Chest: no respiratory distress Heart:regular rate, distal pulses intact Abdomen: soft, nontender Uterine Fundus: firm, appropriately tender DVT Evaluation: No calf swelling or tenderness Extremities: bilateral 2+ edema Skin: warm, dry  Recent Labs    07/26/19 0047  HGB 10.6*  HCT 32.0*    Assessment/Plan: Brunetta Newingham is a 24 y.o. G1P1001 s/p SVD at [redacted]w[redacted]d   PPD#2 - Doing well. Continue routine postpartum care.  Contraception: POP's Feeding: Breast GHTN: She had one elevated BP yesterday that was 145/75. All other BP's have been normal except for immediately after delivery. Will continue to monitor since she will not be discharged. Spinal headache: Anaesthesia is planning on placing an epidural blood patch, but they typically wait 72 hours post-delivery which would be tomorrow (07/29/19 ~12pm). Dispo: Plan for discharge once her headache/neck pain improve.    LOS: 5 days    07/31/19 MD, PGY-1 OBGYN Faculty Teaching Service  07/28/2019, 7:43 AM   I confirm that I have verified the information documented in the resident's note and that I have also  personally reperformed the history, physical exam and all medical decision making activities of this service and have verified that all service and findings are accurately documented in this student's note.   Blood patch will be done by anesthesia tomorrow. Patient does state that she is able to get up and walk around on her own a lot better. She reports "needing to take the Oxycodone and Tylenol around the clock, but the the medication wears off before it is time to take another dose."  07/30/2019, CNM 07/28/2019 11:37 AM

## 2019-07-29 ENCOUNTER — Inpatient Hospital Stay (HOSPITAL_COMMUNITY): Payer: BC Managed Care – PPO | Admitting: Anesthesiology

## 2019-07-29 DIAGNOSIS — G971 Other reaction to spinal and lumbar puncture: Secondary | ICD-10-CM

## 2019-07-29 MED ORDER — IBUPROFEN 600 MG PO TABS: 600.0000 mg | ORAL_TABLET | Freq: Four times a day (QID) | ORAL | 0 refills | Status: DC

## 2019-07-29 MED ORDER — ACETAMINOPHEN 325 MG PO TABS: 650.0000 mg | ORAL_TABLET | ORAL | 0 refills | Status: DC | PRN

## 2019-07-29 MED ORDER — POLYETHYLENE GLYCOL 3350 17 GM/SCOOP PO POWD: 17.0000 g | Freq: Every day | ORAL | 1 refills | Status: DC

## 2019-07-29 NOTE — Anesthesia Procedure Notes (Signed)
Epidural Patient location during procedure: OB Start time: 07/29/2019 9:45 AM End time: 07/29/2019 9:55 AM  Staffing Anesthesiologist: Bethena Midget, MD  Preanesthetic Checklist Completed: patient identified, IV checked, site marked, risks and benefits discussed, surgical consent, monitors and equipment checked, pre-op evaluation and timeout performed  Epidural Patient position: sitting Prep: DuraPrep and site prepped and draped Patient monitoring: continuous pulse ox and blood pressure Approach: midline Location: L2-L3 Injection technique: LOR air  Needle:  Needle type: Tuohy  Needle gauge: 17 G Needle length: 9 cm and 9 Needle insertion depth: 5 cm and 13 cm Catheter type: closed end flexible Catheter size: 19 Gauge Test dose: negative  Assessment Events: blood not aspirated, injection not painful, no injection resistance, no paresthesia and negative IV test  Additional Notes PT tolerated procedure well.  30cc autologous heme given through epidural needle at approximately 13 cm depth.  Pt placed in beach chair position for 2 hours.

## 2019-07-29 NOTE — Discharge Instructions (Signed)

## 2019-07-29 NOTE — Lactation Note (Signed)
This note was copied from a baby's chart. Lactation Consultation Note  Patient Name: Alexandra Collins WYOVZ'C Date: 07/29/2019 Reason for consult: Follow-up assessment  P1 mother whose infant is now 75 hours old.  This is an ETI at 38+4 weeks. Mother has been breast feeding/bottle feeding; mostly formula feeding due to mother having a spinal headache.  She has now received a blood patch and is feeling much better.  She is ready for discharge.  Discussed the importance of putting baby back to the breast now that mother is feeling better.  Encouraged to latch with every feeding to stimulate breasts and help baby learn to latch and feed well.  Asked mother to continue hand expression before/after feedings and to post pump for 15 minutes after feedings to actively stimulate due to the length of time mother has not been latching or pumping.  Feeding plan in place for home.  Mother informed me that, if she cannot breast feed, she will pump and bottle feed her milk instead of using formula.  Praised her for her efforts..  Engorgement prevention/treatment reviewed.  Mother has a manual pump and a DEBP for home use.  She has our OP phone number for questions after discharge.  RN updated.   Maternal Data    Feeding Feeding Type: Bottle Fed - Formula Nipple Type: Slow - flow  LATCH Score                   Interventions    Lactation Tools Discussed/Used     Consult Status Consult Status: Complete Date: 07/29/19 Follow-up type: Call as needed    Corleone Biegler R Selena Swaminathan 07/29/2019, 1:23 PM

## 2019-07-30 ENCOUNTER — Telehealth: Payer: BC Managed Care – PPO | Admitting: Family Medicine

## 2019-07-31 ENCOUNTER — Encounter (HOSPITAL_COMMUNITY): Payer: Self-pay | Admitting: Family Medicine

## 2019-07-31 ENCOUNTER — Other Ambulatory Visit: Payer: Self-pay

## 2019-07-31 ENCOUNTER — Inpatient Hospital Stay (HOSPITAL_COMMUNITY): Payer: BC Managed Care – PPO

## 2019-07-31 ENCOUNTER — Inpatient Hospital Stay (HOSPITAL_COMMUNITY)
Admission: AD | Admit: 2019-07-31 | Discharge: 2019-08-02 | DRG: 776 | Disposition: A | Discharge status: Home / Self Care | Payer: BC Managed Care – PPO | Attending: Obstetrics and Gynecology | Admitting: Obstetrics and Gynecology

## 2019-07-31 DIAGNOSIS — R7989 Other specified abnormal findings of blood chemistry: Secondary | ICD-10-CM | POA: Diagnosis present

## 2019-07-31 DIAGNOSIS — G932 Benign intracranial hypertension: Secondary | ICD-10-CM | POA: Diagnosis present

## 2019-07-31 DIAGNOSIS — O1415 Severe pre-eclampsia, complicating the puerperium: Secondary | ICD-10-CM | POA: Diagnosis present

## 2019-07-31 DIAGNOSIS — G9784 Intracranial hypotension following other procedure: Secondary | ICD-10-CM | POA: Diagnosis not present

## 2019-07-31 DIAGNOSIS — R079 Chest pain, unspecified: Secondary | ICD-10-CM | POA: Diagnosis present

## 2019-07-31 DIAGNOSIS — G96810 Intracranial hypotension, unspecified: Secondary | ICD-10-CM | POA: Diagnosis not present

## 2019-07-31 DIAGNOSIS — O99893 Other specified diseases and conditions complicating puerperium: Secondary | ICD-10-CM | POA: Diagnosis present

## 2019-07-31 DIAGNOSIS — Z880 Allergy status to penicillin: Secondary | ICD-10-CM | POA: Diagnosis not present

## 2019-07-31 DIAGNOSIS — R519 Headache, unspecified: Secondary | ICD-10-CM | POA: Diagnosis not present

## 2019-07-31 LAB — URINALYSIS, ROUTINE W REFLEX MICROSCOPIC
Bilirubin Urine: NEGATIVE
Glucose, UA: NEGATIVE mg/dL
Ketones, ur: NEGATIVE mg/dL
Nitrite: NEGATIVE
Protein, ur: NEGATIVE mg/dL
Specific Gravity, Urine: 1.011 (ref 1.005–1.030)
pH: 8 (ref 5.0–8.0)

## 2019-07-31 LAB — CBC
HCT: 32.7 % — ABNORMAL LOW (ref 36.0–46.0)
Hemoglobin: 10.5 g/dL — ABNORMAL LOW (ref 12.0–15.0)
MCH: 28.2 pg (ref 26.0–34.0)
MCHC: 32.1 g/dL (ref 30.0–36.0)
MCV: 87.7 fL (ref 80.0–100.0)
Platelets: 432 10*3/uL — ABNORMAL HIGH (ref 150–400)
RBC: 3.73 MIL/uL — ABNORMAL LOW (ref 3.87–5.11)
RDW: 13.9 % (ref 11.5–15.5)
WBC: 6.7 10*3/uL (ref 4.0–10.5)
nRBC: 0 % (ref 0.0–0.2)

## 2019-07-31 LAB — COMPREHENSIVE METABOLIC PANEL
ALT: 28 U/L (ref 0–44)
AST: 31 U/L (ref 15–41)
Albumin: 2.9 g/dL — ABNORMAL LOW (ref 3.5–5.0)
Alkaline Phosphatase: 149 U/L — ABNORMAL HIGH (ref 38–126)
Anion gap: 11 (ref 5–15)
BUN: 7 mg/dL (ref 6–20)
CO2: 22 mmol/L (ref 22–32)
Calcium: 8.6 mg/dL — ABNORMAL LOW (ref 8.9–10.3)
Chloride: 103 mmol/L (ref 98–111)
Creatinine, Ser: 0.86 mg/dL (ref 0.44–1.00)
GFR calc Af Amer: >60 mL/min (ref >60)
GFR calc non Af Amer: >60 mL/min (ref >60)
Glucose, Bld: 86 mg/dL (ref 70–99)
Potassium: 3.7 mmol/L (ref 3.5–5.1)
Sodium: 136 mmol/L (ref 135–145)
Total Bilirubin: 0.6 mg/dL (ref 0.3–1.2)
Total Protein: 6.4 g/dL — ABNORMAL LOW (ref 6.5–8.1)

## 2019-07-31 LAB — PROTEIN / CREATININE RATIO, URINE
Creatinine, Urine: 95.15 mg/dL
Protein Creatinine Ratio: 0.15 mg/mg{Cre} (ref 0.00–0.15)
Total Protein, Urine: 14 mg/dL

## 2019-07-31 LAB — BRAIN NATRIURETIC PEPTIDE: B Natriuretic Peptide: 613.6 pg/mL — ABNORMAL HIGH (ref 0.0–100.0)

## 2019-07-31 LAB — TROPONIN I (HIGH SENSITIVITY): Troponin I (High Sensitivity): 9 ng/L (ref <18)

## 2019-07-31 MED ORDER — LABETALOL HCL 5 MG/ML IV SOLN: 80.0000 mg | INTRAVENOUS | Status: DC | PRN

## 2019-07-31 MED ORDER — ONDANSETRON HCL 4 MG PO TABS: 4.0000 mg | ORAL_TABLET | Freq: Four times a day (QID) | ORAL | Status: DC | PRN

## 2019-07-31 MED ORDER — ONDANSETRON HCL 4 MG/2ML IJ SOLN: 4.0000 mg | Freq: Four times a day (QID) | INTRAMUSCULAR | Status: DC | PRN

## 2019-07-31 MED ORDER — HYDRALAZINE HCL 20 MG/ML IJ SOLN: 10.0000 mg | INTRAMUSCULAR | Status: DC | PRN

## 2019-07-31 MED ORDER — MAGNESIUM SULFATE 40 GM/1000ML IV SOLN
INTRAVENOUS | Status: AC
  Administered 2019-07-31: 4 g via INTRAVENOUS
  Filled 2019-07-31: qty 1000

## 2019-07-31 MED ORDER — LABETALOL HCL 5 MG/ML IV SOLN: 40.0000 mg | INTRAVENOUS | Status: DC | PRN

## 2019-07-31 MED ORDER — ACETAMINOPHEN 325 MG PO TABS: 650.0000 mg | ORAL_TABLET | ORAL | Status: DC | PRN

## 2019-07-31 MED ORDER — MAGNESIUM SULFATE 40 GM/1000ML IV SOLN
2.0000 g/h | INTRAVENOUS | Status: AC
  Administered 2019-07-31 – 2019-08-01 (×2): 2 g/h via INTRAVENOUS
  Filled 2019-07-31: qty 1000

## 2019-07-31 MED ORDER — LACTATED RINGERS IV SOLN: INTRAVENOUS | Status: DC

## 2019-07-31 MED ORDER — FUROSEMIDE 10 MG/ML IJ SOLN
20.0000 mg | Freq: Once | INTRAMUSCULAR | Status: AC
  Administered 2019-07-31: 20 mg via INTRAVENOUS
  Filled 2019-07-31: qty 2

## 2019-07-31 MED ORDER — LABETALOL HCL 5 MG/ML IV SOLN
20.0000 mg | INTRAVENOUS | Status: DC | PRN
  Administered 2019-07-31: 20 mg via INTRAVENOUS
  Filled 2019-07-31: qty 4

## 2019-07-31 MED ORDER — MAGNESIUM SULFATE BOLUS VIA INFUSION
4.0000 g | Freq: Once | INTRAVENOUS | Status: AC
  Filled 2019-07-31: qty 1000

## 2019-07-31 MED ORDER — PRENATAL MULTIVITAMIN CH
1.0000 | ORAL_TABLET | Freq: Every day | ORAL | Status: DC
  Administered 2019-08-01: 1 via ORAL
  Filled 2019-07-31: qty 1

## 2019-07-31 MED ORDER — ENOXAPARIN SODIUM 80 MG/0.8ML ~~LOC~~ SOLN
0.5000 mg/kg | SUBCUTANEOUS | Status: DC
  Administered 2019-07-31 – 2019-08-01 (×2): 75 mg via SUBCUTANEOUS
  Filled 2019-07-31 (×2): qty 0.8

## 2019-07-31 MED ORDER — ENALAPRIL MALEATE 5 MG PO TABS
5.0000 mg | ORAL_TABLET | Freq: Every day | ORAL | Status: DC
  Administered 2019-08-01 – 2019-08-02 (×2): 5 mg via ORAL
  Filled 2019-07-31 (×2): qty 1

## 2019-07-31 NOTE — MAU Note (Signed)
Pt states she had a epidural on 3/18. Was diagnosed with spinal HA, shoulder pain and neck pain. States it got better when she had a blood patch on 3/21. States HA got better but came back this morning. States she has been taking Tylenol and Ibuprofen, last took around 1700. States it has not helped. Pt denies vision changes or RUQ pain. Pt is crying inconsolably in triage.

## 2019-07-31 NOTE — H&P (Signed)
Chief Complaint:  Postpartum Complications and Headache   Provider saw patient at 2030  HPI: Alexandra Collins is a 24 y.o. G1P1001 who presents to maternity admissions reporting severe headache.  Had been treated on 07/29/19 for a presumed spinal headache with a blood patch.  Did get improvement for a time but headache recurred this morning.   Unresponsive to Tylenol and ibuprofen.  Also reports RUQ pain and chest pain which is worsened on inspiration.  She reports vaginal bleeding, no vaginal itching/burning, urinary symptoms, dizziness, n/v, or fever/chills.    SVD on 07/26/19  Does have a history of gestational hypertension (without preeclampsia) which could be chronic hypertension (had some elevations in early pregnancy) BPs at discharge:  Vitals:   07/29/19 0758 07/29/19 0935 07/29/19 0956 07/29/19 1015  BP: (!) 122/58 (!) 147/76 (!) 125/56 126/60  Pulse: 63 72 81 63  Resp: 14     Temp: 98 F (36.7 C)        Headache  This is a recurrent problem. The current episode started today. The problem occurs constantly. The problem has been gradually worsening. The pain is located in the frontal region. The pain does not radiate. The pain quality is not similar to prior headaches. The quality of the pain is described as aching. The pain is severe. Associated symptoms include abdominal pain (RUQ) and photophobia. Pertinent negatives include no back pain, blurred vision, coughing, dizziness, fever, muscle aches, nausea, seizures, sinus pressure, visual change or vomiting. Associated symptoms comments: Chest pain, worsened with deep inspiration. The symptoms are aggravated by bright light. She has tried acetaminophen and NSAIDs for the symptoms. The treatment provided no relief. Her past medical history is significant for hypertension and obesity.   RN Note: Pt states she had a epidural on 3/18. Was diagnosed with spinal HA, shoulder pain and neck pain. States it got better when she had a  blood patch on 3/21. States HA got better but came back this morning. States she has been taking Tylenol and Ibuprofen, last took around 1700. States it has not helped. Pt denies vision changes or RUQ pain. Pt is crying inconsolably in triage.  Past Medical History: Past Medical History:  Diagnosis Date  . Anemia   . Hydradenitis     Past obstetric history: OB History  Gravida Para Term Preterm AB Living  1 1 1     1   SAB TAB Ectopic Multiple Live Births        0 1    # Outcome Date GA Lbr Len/2nd Weight Sex Delivery Anes PTL Lv  1 Term 07/26/19 [redacted]w[redacted]d / 02:22 3116 g F Vag-Spont EPI, Intrathecal  LIV     Birth Comments: moulding/caput    Past Surgical History: Past Surgical History:  Procedure Laterality Date  . KNEE ARTHROSCOPY Right     Family History: Family History  Problem Relation Age of Onset  . Hypertension Mother   . Seizures Father   . Hypertension Father     Social History: Social History   Tobacco Use  . Smoking status: Never Smoker  . Smokeless tobacco: Never Used  Substance Use Topics  . Alcohol use: No  . Drug use: No    Allergies:  Allergies  Allergen Reactions  . Ceclor [Cefaclor] Hives  . Garlic Hives    Only allergic to raw garlic  . Penicillins Hives    Has patient had a PCN reaction causing immediate rash, facial/tongue/throat swelling, SOB or lightheadedness with hypotension: Yes Has patient  had a PCN reaction causing severe rash involving mucus membranes or skin necrosis: Yes Has patient had a PCN reaction that required hospitalization: No Has patient had a PCN reaction occurring within the last 10 years: No If all of the above answers are "NO", then may proceed with Cephalosporin use.   . Shellfish Allergy Itching  . Sulfamethoxazole Nausea And Vomiting  . Sulfur Hives  . Augmentin [Amoxicillin-Pot Clavulanate] Rash    Meds:  Medications Prior to Admission  Medication Sig Dispense Refill Last Dose  . acetaminophen (TYLENOL)  325 MG tablet Take 2 tablets (650 mg total) by mouth every 4 (four) hours as needed (for pain scale < 4). 30 tablet 0 07/31/2019 at Unknown time  . aspirin 81 MG chewable tablet Chew 81 mg by mouth daily.   07/30/2019 at Unknown time  . ibuprofen (ADVIL) 600 MG tablet Take 1 tablet (600 mg total) by mouth every 6 (six) hours. 30 tablet 0 07/31/2019 at Unknown time  . Prenatal Vit-Fe Fumarate-FA (PRENATAL MULTIVITAMIN) TABS tablet Take 1 tablet by mouth daily at 12 noon.   07/31/2019 at Unknown time  . Pseudoephedrine-APAP-DM (RA ACETAMINOPHEN FLU COLD PO) Take by mouth.   07/30/2019 at Unknown time  . albuterol (VENTOLIN HFA) 108 (90 Base) MCG/ACT inhaler Inhale 2 puffs into the lungs every 6 (six) hours as needed for wheezing or shortness of breath.   Unknown at Unknown time  . calcium carbonate (TUMS - DOSED IN MG ELEMENTAL CALCIUM) 500 MG chewable tablet Chew 2 tablets by mouth 2 (two) times daily as needed for indigestion or heartburn.   Unknown at Unknown time  . doxylamine, Sleep, (UNISOM) 25 MG tablet Take 1 tablet (25 mg total) by mouth at bedtime as needed. 30 tablet 0 Unknown at Unknown time  . EPINEPHrine 0.3 mg/0.3 mL IJ SOAJ injection Inject 0.3 mg into the muscle as needed for anaphylaxis.     Marland Kitchen polyethylene glycol powder (GLYCOLAX/MIRALAX) 17 GM/SCOOP powder Take 17 g by mouth daily. 510 g 1 Unknown at Unknown time    I have reviewed patient's Past Medical Hx, Surgical Hx, Family Hx, Social Hx, medications and allergies.  ROS:  Review of Systems  Constitutional: Negative for fever.  HENT: Negative for sinus pressure.   Eyes: Positive for photophobia. Negative for blurred vision.  Respiratory: Negative for cough.   Gastrointestinal: Positive for abdominal pain (RUQ). Negative for nausea and vomiting.  Musculoskeletal: Negative for back pain.  Neurological: Positive for headaches. Negative for dizziness and seizures.   Other systems negative     Physical Exam   Patient Vitals  for the past 24 hrs:  BP Temp Temp src Pulse Resp SpO2 Height Weight  07/31/19 2155 -- -- -- -- -- 98 % -- --  07/31/19 2150 -- -- -- -- -- 98 % -- --  07/31/19 2146 (!) 143/81 -- -- 77 -- -- -- --  07/31/19 2145 -- -- -- -- -- 99 % -- --  07/31/19 2140 -- -- -- -- -- 95 % -- --  07/31/19 2137 (!) 146/72 -- -- 75 -- -- -- --  07/31/19 2136 (!) 146/72 98.4 F (36.9 C) Oral 75 19 -- -- --  07/31/19 2131 (!) 150/76 -- -- 68 -- -- -- --  07/31/19 2130 -- -- -- -- -- 95 % -- --  07/31/19 2125 -- -- -- -- -- 93 % -- --  07/31/19 2124 (!) 153/75 98.5 F (36.9 C) Oral 70 (!) 23 96 % -- --  07/31/19 2120 -- -- -- -- -- 97 % -- --  07/31/19 2115 -- -- -- -- -- 98 % -- --  07/31/19 2113 (!) 166/86 98.5 F (36.9 C) Oral 72 (!) 26 97 % -- --  07/31/19 2112 -- -- -- -- -- 97 % -- --  07/31/19 2110 -- -- -- -- -- 97 % -- --  07/31/19 2105 -- -- -- -- -- 100 % -- --  07/31/19 2100 (!) 168/86 -- -- 74 -- -- -- --  07/31/19 2046 (!) 173/113 -- -- 69 -- -- -- --  07/31/19 2031 (!) 169/98 -- -- 62 -- -- -- --  07/31/19 2023 (!) 159/94 -- -- (!) 59 -- -- -- --  07/31/19 2002 (!) 156/108 98.4 F (36.9 C) Oral 83 (!) 24 97 % 5\' 6"  (1.676 m) (!) 147.9 kg   Constitutional: Well-developed, well-nourished female in no acute distress, but quite uncomfortable with cloth over face.  Cardiovascular: normal rate and rhythm, no ectopy audible, S1 & S2 heard Respiratory: normal effort, no distress. Lungs CTAB with no wheezes or crackles  Oxygen sats ranging between 93-98% GI: Abd soft, non-tender except for mild tenderness over RUQ.  Nondistended.  No rebound, No guarding.  Bowel Sounds audible  MS: Extremities nontender, 3+4+ edema (tender to touch), normal ROM Neurologic: Alert and oriented x 4.   Grossly nonfocal.   DTRs 3+ with no clonus GU: Neg CVAT. Skin:  Warm and Dry Psych:  Affect appropriate.  PELVIC EXAM: deferred, small lochia   Labs: --/--/O POS (03/15 1633)  Imaging:  DG CHEST PORT 1  VIEW  Result Date: 07/31/2019 CLINICAL DATA:  24 year old female with chest pain. EXAM: PORTABLE CHEST 1 VIEW COMPARISON:  Chest radiograph dated 07/21/2018. FINDINGS: Shallow inspiration. No focal consolidation, pleural effusion or pneumothorax. Mild enlargement of the cardiopericardial silhouette which may be accentuated by shallow inspiration. No acute osseous pathology. IMPRESSION: No acute cardiopulmonary process. Electronically Signed   By: 07/23/2018 M.D.   On: 07/31/2019 21:57    MAU Course/MDM: I have ordered labs as follows:  Preeclampsia labs, BNP, Troponin Imaging ordered: Chest xray Results reviewed.  Consult Dr 08/02/2019.  He recommends Lasix 20mg  and admission with Magnesium Sulfate infusion.  Will  Give Lovenox prophylaxis and start her on Vasotec.  .   Treatments in MAU included Preeclampsia focused order set with Labetalol.  This did improve severe range BPs.  .   Pt stable at time of transfer  Assessment: Hypertension in pregnancy, preeclampsia, severe, delivered/postpartum  Chest pain - Plan: DG CHEST PORT 1 VIEW, DG CHEST PORT 1 VIEW  Elevated brain natriuretic peptide (BNP) level  Postpartum Day #5  Plan: Admit to [facility] specialty Care unit Routine orders Lovenox prophylaxis Magnesium Sulfate infusion Vasotec Lasix 20mg  IV x 1 Strict I&O and Daily Weights MD to follow  CNM, MSN Certified Nurse-Midwife 07/31/2019

## 2019-07-31 NOTE — MAU Provider Note (Signed)
Chief Complaint:  Postpartum Complications and Headache   Provider saw patient at 2030  HPI: Alexandra Collins is a 24 y.o. G1P1001 who presents to maternity admissions reporting severe headache.  Had been treated on 07/29/19 for a presumed spinal headache with a blood patch.  Did get improvement for a time but headache recurred this morning.   Unresponsive to Tylenol and ibuprofen.  Also reports RUQ pain and chest pain which is worsened on inspiration.  She reports vaginal bleeding, no vaginal itching/burning, urinary symptoms, dizziness, n/v, or fever/chills.    SVD on 07/26/19  Does have a history of gestational hypertension (without preeclampsia) which could be chronic hypertension (had some elevations in early pregnancy) BPs at discharge:  Vitals:   07/29/19 0758 07/29/19 0935 07/29/19 0956 07/29/19 1015  BP: (!) 122/58 (!) 147/76 (!) 125/56 126/60  Pulse: 63 72 81 63  Resp: 14     Temp: 98 F (36.7 C)        Headache  This is a recurrent problem. The current episode started today. The problem occurs constantly. The problem has been gradually worsening. The pain is located in the frontal region. The pain does not radiate. The pain quality is not similar to prior headaches. The quality of the pain is described as aching. The pain is severe. Associated symptoms include abdominal pain (RUQ) and photophobia. Pertinent negatives include no back pain, blurred vision, coughing, dizziness, fever, muscle aches, nausea, seizures, sinus pressure, visual change or vomiting. Associated symptoms comments: Chest pain, worsened with deep inspiration. The symptoms are aggravated by bright light. She has tried acetaminophen and NSAIDs for the symptoms. The treatment provided no relief. Her past medical history is significant for hypertension and obesity.   RN Note: Pt states she had a epidural on 3/18. Was diagnosed with spinal HA, shoulder pain and neck pain. States it got better when she had a  blood patch on 3/21. States HA got better but came back this morning. States she has been taking Tylenol and Ibuprofen, last took around 1700. States it has not helped. Pt denies vision changes or RUQ pain. Pt is crying inconsolably in triage.  Past Medical History: Past Medical History:  Diagnosis Date  . Anemia   . Hydradenitis     Past obstetric history: OB History  Gravida Para Term Preterm AB Living  1 1 1     1   SAB TAB Ectopic Multiple Live Births        0 1    # Outcome Date GA Lbr Len/2nd Weight Sex Delivery Anes PTL Lv  1 Term 07/26/19 [redacted]w[redacted]d / 02:22 3116 g F Vag-Spont EPI, Intrathecal  LIV     Birth Comments: moulding/caput    Past Surgical History: Past Surgical History:  Procedure Laterality Date  . KNEE ARTHROSCOPY Right     Family History: Family History  Problem Relation Age of Onset  . Hypertension Mother   . Seizures Father   . Hypertension Father     Social History: Social History   Tobacco Use  . Smoking status: Never Smoker  . Smokeless tobacco: Never Used  Substance Use Topics  . Alcohol use: No  . Drug use: No    Allergies:  Allergies  Allergen Reactions  . Ceclor [Cefaclor] Hives  . Garlic Hives    Only allergic to raw garlic  . Penicillins Hives    Has patient had a PCN reaction causing immediate rash, facial/tongue/throat swelling, SOB or lightheadedness with hypotension: Yes Has patient  had a PCN reaction causing severe rash involving mucus membranes or skin necrosis: Yes Has patient had a PCN reaction that required hospitalization: No Has patient had a PCN reaction occurring within the last 10 years: No If all of the above answers are "NO", then may proceed with Cephalosporin use.   . Shellfish Allergy Itching  . Sulfamethoxazole Nausea And Vomiting  . Sulfur Hives  . Augmentin [Amoxicillin-Pot Clavulanate] Rash    Meds:  Medications Prior to Admission  Medication Sig Dispense Refill Last Dose  . acetaminophen (TYLENOL)  325 MG tablet Take 2 tablets (650 mg total) by mouth every 4 (four) hours as needed (for pain scale < 4). 30 tablet 0 07/31/2019 at Unknown time  . aspirin 81 MG chewable tablet Chew 81 mg by mouth daily.   07/30/2019 at Unknown time  . ibuprofen (ADVIL) 600 MG tablet Take 1 tablet (600 mg total) by mouth every 6 (six) hours. 30 tablet 0 07/31/2019 at Unknown time  . Prenatal Vit-Fe Fumarate-FA (PRENATAL MULTIVITAMIN) TABS tablet Take 1 tablet by mouth daily at 12 noon.   07/31/2019 at Unknown time  . Pseudoephedrine-APAP-DM (RA ACETAMINOPHEN FLU COLD PO) Take by mouth.   07/30/2019 at Unknown time  . albuterol (VENTOLIN HFA) 108 (90 Base) MCG/ACT inhaler Inhale 2 puffs into the lungs every 6 (six) hours as needed for wheezing or shortness of breath.   Unknown at Unknown time  . calcium carbonate (TUMS - DOSED IN MG ELEMENTAL CALCIUM) 500 MG chewable tablet Chew 2 tablets by mouth 2 (two) times daily as needed for indigestion or heartburn.   Unknown at Unknown time  . doxylamine, Sleep, (UNISOM) 25 MG tablet Take 1 tablet (25 mg total) by mouth at bedtime as needed. 30 tablet 0 Unknown at Unknown time  . EPINEPHrine 0.3 mg/0.3 mL IJ SOAJ injection Inject 0.3 mg into the muscle as needed for anaphylaxis.     Marland Kitchen polyethylene glycol powder (GLYCOLAX/MIRALAX) 17 GM/SCOOP powder Take 17 g by mouth daily. 510 g 1 Unknown at Unknown time    I have reviewed patient's Past Medical Hx, Surgical Hx, Family Hx, Social Hx, medications and allergies.  ROS:  Review of Systems  Constitutional: Negative for fever.  HENT: Negative for sinus pressure.   Eyes: Positive for photophobia. Negative for blurred vision.  Respiratory: Negative for cough.   Gastrointestinal: Positive for abdominal pain (RUQ). Negative for nausea and vomiting.  Musculoskeletal: Negative for back pain.  Neurological: Positive for headaches. Negative for dizziness and seizures.   Other systems negative     Physical Exam   Patient Vitals  for the past 24 hrs:  BP Temp Temp src Pulse Resp SpO2 Height Weight  07/31/19 2155 -- -- -- -- -- 98 % -- --  07/31/19 2150 -- -- -- -- -- 98 % -- --  07/31/19 2146 (!) 143/81 -- -- 77 -- -- -- --  07/31/19 2145 -- -- -- -- -- 99 % -- --  07/31/19 2140 -- -- -- -- -- 95 % -- --  07/31/19 2137 (!) 146/72 -- -- 75 -- -- -- --  07/31/19 2136 (!) 146/72 98.4 F (36.9 C) Oral 75 19 -- -- --  07/31/19 2131 (!) 150/76 -- -- 68 -- -- -- --  07/31/19 2130 -- -- -- -- -- 95 % -- --  07/31/19 2125 -- -- -- -- -- 93 % -- --  07/31/19 2124 (!) 153/75 98.5 F (36.9 C) Oral 70 (!) 23 96 % -- --  07/31/19 2120 -- -- -- -- -- 97 % -- --  07/31/19 2115 -- -- -- -- -- 98 % -- --  07/31/19 2113 (!) 166/86 98.5 F (36.9 C) Oral 72 (!) 26 97 % -- --  07/31/19 2112 -- -- -- -- -- 97 % -- --  07/31/19 2110 -- -- -- -- -- 97 % -- --  07/31/19 2105 -- -- -- -- -- 100 % -- --  07/31/19 2100 (!) 168/86 -- -- 74 -- -- -- --  07/31/19 2046 (!) 173/113 -- -- 69 -- -- -- --  07/31/19 2031 (!) 169/98 -- -- 62 -- -- -- --  07/31/19 2023 (!) 159/94 -- -- (!) 59 -- -- -- --  07/31/19 2002 (!) 156/108 98.4 F (36.9 C) Oral 83 (!) 24 97 % 5\' 6"  (1.676 m) (!) 147.9 kg   Constitutional: Well-developed, well-nourished female in no acute distress, but quite uncomfortable with cloth over face.  Cardiovascular: normal rate and rhythm, no ectopy audible, S1 & S2 heard Respiratory: normal effort, no distress. Lungs CTAB with no wheezes or crackles  Oxygen sats ranging between 93-98% GI: Abd soft, non-tender except for mild tenderness over RUQ.  Nondistended.  No rebound, No guarding.  Bowel Sounds audible  MS: Extremities nontender, 3+4+ edema (tender to touch), normal ROM Neurologic: Alert and oriented x 4.   Grossly nonfocal.   DTRs 3+ with no clonus GU: Neg CVAT. Skin:  Warm and Dry Psych:  Affect appropriate.  PELVIC EXAM: deferred, small lochia   Labs: --/--/O POS (03/15 1633)  Imaging:  DG CHEST PORT 1  VIEW  Result Date: 07/31/2019 CLINICAL DATA:  24 year old female with chest pain. EXAM: PORTABLE CHEST 1 VIEW COMPARISON:  Chest radiograph dated 07/21/2018. FINDINGS: Shallow inspiration. No focal consolidation, pleural effusion or pneumothorax. Mild enlargement of the cardiopericardial silhouette which may be accentuated by shallow inspiration. No acute osseous pathology. IMPRESSION: No acute cardiopulmonary process. Electronically Signed   By: Anner Crete M.D.   On: 07/31/2019 21:57    MAU Course/MDM: I have ordered labs as follows:  Preeclampsia labs, BNP, Troponin Imaging ordered: Chest xray Results reviewed.  Consult Dr Nehemiah Settle.  He recommends Lasix 20mg  and admission with Magnesium Sulfate infusion.  Will  Give Lovenox prophylaxis and start her on Vasotec.  .   Treatments in MAU included Preeclampsia focused order set with Labetalol.  This did improve severe range BPs.  .   Pt stable at time of transfer  Assessment: Hypertension in pregnancy, preeclampsia, severe, delivered/postpartum  Chest pain - Plan: DG CHEST PORT 1 VIEW, DG CHEST PORT 1 VIEW  Elevated brain natriuretic peptide (BNP) level  Postpartum Day #5  Plan: Admit to [facility] specialty Care unit Routine orders Lovenox prophylaxis Magnesium Sulfate infusion Vasotec Lasix 20mg  IV x 1 Strict I&O and Daily Weights MD to follow  Hansel Feinstein CNM, MSN Certified Nurse-Midwife 07/31/2019 10:01 PM

## 2019-08-01 ENCOUNTER — Encounter (HOSPITAL_COMMUNITY): Payer: Self-pay | Admitting: Family Medicine

## 2019-08-01 ENCOUNTER — Inpatient Hospital Stay (HOSPITAL_COMMUNITY): Payer: BC Managed Care – PPO

## 2019-08-01 DIAGNOSIS — G9784 Intracranial hypotension following other procedure: Secondary | ICD-10-CM

## 2019-08-01 LAB — COMPREHENSIVE METABOLIC PANEL
ALT: 25 U/L (ref 0–44)
AST: 27 U/L (ref 15–41)
Albumin: 2.8 g/dL — ABNORMAL LOW (ref 3.5–5.0)
Alkaline Phosphatase: 140 U/L — ABNORMAL HIGH (ref 38–126)
Anion gap: 10 (ref 5–15)
BUN: <5 mg/dL — ABNORMAL LOW (ref 6–20)
CO2: 23 mmol/L (ref 22–32)
Calcium: 8.1 mg/dL — ABNORMAL LOW (ref 8.9–10.3)
Chloride: 106 mmol/L (ref 98–111)
Creatinine, Ser: 0.82 mg/dL (ref 0.44–1.00)
GFR calc Af Amer: >60 mL/min (ref >60)
GFR calc non Af Amer: >60 mL/min (ref >60)
Glucose, Bld: 97 mg/dL (ref 70–99)
Potassium: 3.4 mmol/L — ABNORMAL LOW (ref 3.5–5.1)
Sodium: 139 mmol/L (ref 135–145)
Total Bilirubin: 0.4 mg/dL (ref 0.3–1.2)
Total Protein: 6.3 g/dL — ABNORMAL LOW (ref 6.5–8.1)

## 2019-08-01 LAB — CBC
HCT: 31.9 % — ABNORMAL LOW (ref 36.0–46.0)
Hemoglobin: 10.3 g/dL — ABNORMAL LOW (ref 12.0–15.0)
MCH: 27.8 pg (ref 26.0–34.0)
MCHC: 32.3 g/dL (ref 30.0–36.0)
MCV: 86.2 fL (ref 80.0–100.0)
Platelets: 429 10*3/uL — ABNORMAL HIGH (ref 150–400)
RBC: 3.7 MIL/uL — ABNORMAL LOW (ref 3.87–5.11)
RDW: 13.9 % (ref 11.5–15.5)
WBC: 7.5 10*3/uL (ref 4.0–10.5)
nRBC: 0.3 % — ABNORMAL HIGH (ref 0.0–0.2)

## 2019-08-01 MED ORDER — DEXAMETHASONE 6 MG PO TABS
10.0000 mg | ORAL_TABLET | Freq: Once | ORAL | Status: AC
  Administered 2019-08-01: 10 mg via ORAL
  Filled 2019-08-01: qty 1

## 2019-08-01 MED ORDER — OXYCODONE-ACETAMINOPHEN 5-325 MG PO TABS
1.0000 | ORAL_TABLET | Freq: Once | ORAL | Status: AC
  Administered 2019-08-01: 1 via ORAL
  Filled 2019-08-01: qty 1

## 2019-08-01 MED ORDER — CYCLOBENZAPRINE HCL 10 MG PO TABS
10.0000 mg | ORAL_TABLET | Freq: Three times a day (TID) | ORAL | Status: DC | PRN
  Administered 2019-08-01: 10 mg via ORAL
  Filled 2019-08-01: qty 1

## 2019-08-01 MED ORDER — METOCLOPRAMIDE HCL 10 MG PO TABS
10.0000 mg | ORAL_TABLET | Freq: Once | ORAL | Status: AC
  Administered 2019-08-01: 10 mg via ORAL
  Filled 2019-08-01: qty 1

## 2019-08-01 MED ORDER — DEXAMETHASONE SODIUM PHOSPHATE 10 MG/ML IJ SOLN
10.0000 mg | Freq: Once | INTRAMUSCULAR | Status: DC
  Filled 2019-08-01: qty 1

## 2019-08-01 MED ORDER — METOCLOPRAMIDE HCL 5 MG/ML IJ SOLN: 10.0000 mg | Freq: Once | INTRAMUSCULAR | Status: DC

## 2019-08-01 MED ORDER — DIPHENHYDRAMINE HCL 50 MG/ML IJ SOLN: 25.0000 mg | Freq: Once | INTRAMUSCULAR | Status: DC

## 2019-08-01 MED ORDER — DIPHENHYDRAMINE HCL 25 MG PO CAPS
25.0000 mg | ORAL_CAPSULE | Freq: Once | ORAL | Status: AC
  Administered 2019-08-01: 25 mg via ORAL
  Filled 2019-08-01: qty 1

## 2019-08-01 NOTE — Consult Note (Addendum)
Neurology Consultation  Reason for Consult: Headache Referring Physician: Mora Bellman MD  CC: Headache status post delivery  History is obtained from: Patient  HPI: Alexandra Collins is a 24 y.o. female with history of hydradenitis and anemia.  She is G1 P1-0-0-1.  Patient had a vaginal delivery on the 18th.  Patient did have a epidural for this.  Postop patient developed postural headache.  Patient has received a blood patch on 07/29/2019 however, although it did work initially her postural headache returned. Thus neurology was asked to evaluate for any recommendations.  Patient states that post delivery the headache developed approximately 30 to 45 minutes after.  Although it did not immediately start it did gradually start but neck fast manner.  She states it definitely was when she was sitting up or getting up that she would have a pounding headache, but when laying down the headache would subside significantly.  As noted above she states that after the blood patch was administered she did have approximately 1 day of relief.  After 1 day she noticed the headache returned.  None of the medications that were administered to relieve her headache gave her relief.  These included migraine cocktail, magnesium, Percocet or Flexeril gave her any relief.  Currently patient is in the supine position stating that she does have a mild headache and at times has decreased hearing.  She states that she does not have any visual changes.  She states that she does not have any numbness or tingling throughout her extremities.  As would be expected she is frustrated that this is impeding her ability to take care of her baby.   Patient has been given the below drugs for her headache: Migraine cocktail including Reglan, Benadryl, Decadron Magnesium bolus of 4 g Percocet Flexeril  Past Medical History:  Diagnosis Date  . Anemia   . Hydradenitis     Family History  Problem Relation Age of Onset  .  Hypertension Mother   . Seizures Father   . Hypertension Father    Social History:   reports that she has never smoked. She has never used smokeless tobacco. She reports that she does not drink alcohol or use drugs. 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  acetaminophen (TYLENOL) tablet 650 mg, 650 mg, Oral, Q4H PRN, Seabron Spates, CNM .  cyclobenzaprine (FLEXERIL) tablet 10 mg, 10 mg, Oral, TID PRN, Constant, Peggy, MD, 10 mg at 08/01/19 1043 .  enalapril (VASOTEC) tablet 5 mg, 5 mg, Oral, Daily, Seabron Spates, CNM, 5 mg at 08/01/19 0913 .  enoxaparin (LOVENOX) injection 75 mg, 0.5 mg/kg, Subcutaneous, Q24H, Seabron Spates, CNM, 75 mg at 07/31/19 2343 .  labetalol (NORMODYNE) injection 20 mg, 20 mg, Intravenous, PRN, 20 mg at 07/31/19 2103 **AND** labetalol (NORMODYNE) injection 40 mg, 40 mg, Intravenous, PRN **AND** labetalol (NORMODYNE) injection 80 mg, 80 mg, Intravenous, PRN **AND** hydrALAZINE (APRESOLINE) injection 10 mg, 10 mg, Intravenous, PRN **AND** [COMPLETED] Measure blood pressure, , , Once, Seabron Spates, CNM .  lactated ringers infusion, , Intravenous, Continuous, Constant, Peggy, MD, Last Rate: 125 mL/hr at 07/31/19 2103, New Bag at 07/31/19 2103 .  lactated ringers infusion, , Intravenous, Continuous, Seabron Spates, CNM, Last Rate: 50 mL/hr at 08/01/19 1114, New Bag at 08/01/19 1114 .  magnesium sulfate 40 grams in SWI 1000 mL OB infusion, 2 g/hr, Intravenous, Titrated, Constant, Peggy, MD, Last Rate: 50  mL/hr at 08/01/19 1112, 2 g/hr at 08/01/19 1112 .  ondansetron (ZOFRAN) tablet 4 mg, 4 mg, Oral, Q6H PRN **OR** ondansetron (ZOFRAN) injection 4 mg, 4 mg, Intravenous, Q6H PRN, Aviva Signs, CNM .  prenatal multivitamin tablet 1 tablet, 1 tablet, Oral, Q1200, Aviva Signs, CNM, 1 tablet at 08/01/19 0911  ROS:    General ROS: Positive for -headache which is  postural Psychological ROS: negative for - behavioral disorder, hallucinations, memory difficulties, mood swings or suicidal ideation Ophthalmic ROS: negative for - blurry vision, double vision, eye pain or loss of vision ENT ROS: Positive for -decreased hearing Allergy and Immunology ROS: negative for - hives or itchy/watery eyes Hematological and Lymphatic ROS: negative for - bleeding problems, bruising or swollen lymph nodes Endocrine ROS: negative for - galactorrhea, hair pattern changes, polydipsia/polyuria or temperature intolerance Respiratory ROS: negative for - cough, hemoptysis, shortness of breath or wheezing Cardiovascular ROS: negative for - chest pain, dyspnea on exertion, edema or irregular heartbeat Gastrointestinal ROS: negative for - abdominal pain, diarrhea, hematemesis, nausea/vomiting or stool incontinence Genito-Urinary ROS: negative for - dysuria, hematuria, incontinence or urinary frequency/urgency Musculoskeletal ROS: negative for - joint swelling or muscular weakness Neurological ROS: as noted in HPI Dermatological ROS: negative for rash and skin lesion changes  Exam: Current vital signs: BP 122/70 (BP Location: Right Arm)   Pulse 77   Temp 98.2 F (36.8 C) (Oral)   Resp 18   Ht 5\' 6"  (1.676 m)   Wt (!) 144.3 kg   SpO2 99%   BMI 51.33 kg/m  Vital signs in last 24 hours: Temp:  [97.7 F (36.5 C)-98.5 F (36.9 C)] 98.2 F (36.8 C) (03/24 0846) Pulse Rate:  [59-88] 77 (03/24 0846) Resp:  [18-26] 18 (03/24 0846) BP: (122-173)/(53-113) 122/70 (03/24 0846) SpO2:  [93 %-100 %] 99 % (03/24 0846) Weight:  [144.3 kg-147.9 kg] 144.3 kg (03/24 0846)   Constitutional: Appears well-developed and well-nourished.  Psych: Affect appropriate to situation Eyes: No scleral injection HENT: No OP obstrucion Head: Normocephalic.  Cardiovascular: Normal rate and regular rhythm.  Respiratory: Effort normal, non-labored breathing GI: Soft.  No distension. There is no  tenderness.  Skin: WDI with anasarca Neuro: Mental Status: Patient is awake, alert, oriented to person, place, month, year, and situation. Speech-intact Patient is able to give a clear and coherent history. Cranial Nerves: II: Visual Fields are full.  III,IV, VI: EOMI without ptosis or diploplia. Pupils equal, round and reactive to light V: Facial sensation is symmetric to temperature VII: Facial movement is symmetric.  VIII: hearing is intact to voice Motor: Tone is normal. Bulk is normal. 5/5 strength was present in all four extremities with the left leg being slightly weaker than right Sensory: Sensation is symmetric to light touch and temperature in the arms and legs.  Labs I have reviewed labs in epic and the results pertinent to this consultation are:  CBC    Component Value Date/Time   WBC 7.5 08/01/2019 0519   RBC 3.70 (L) 08/01/2019 0519   HGB 10.3 (L) 08/01/2019 0519   HGB 10.8 (L) 05/28/2019 0955   HCT 31.9 (L) 08/01/2019 0519   HCT 32.3 (L) 05/28/2019 0955   PLT 429 (H) 08/01/2019 0519   PLT 414 05/28/2019 0955   MCV 86.2 08/01/2019 0519   MCV 90 05/28/2019 0955   MCH 27.8 08/01/2019 0519   MCHC 32.3 08/01/2019 0519   RDW 13.9 08/01/2019 0519   RDW 12.8 05/28/2019 0955    CMP  Component Value Date/Time   NA 139 08/01/2019 0519   K 3.4 (L) 08/01/2019 0519   CL 106 08/01/2019 0519   CO2 23 08/01/2019 0519   GLUCOSE 97 08/01/2019 0519   BUN <5 (L) 08/01/2019 0519   CREATININE 0.82 08/01/2019 0519   CALCIUM 8.1 (L) 08/01/2019 0519   PROT 6.3 (L) 08/01/2019 0519   ALBUMIN 2.8 (L) 08/01/2019 0519   AST 27 08/01/2019 0519   ALT 25 08/01/2019 0519   ALKPHOS 140 (H) 08/01/2019 0519   BILITOT 0.4 08/01/2019 0519   GFRNONAA >60 08/01/2019 0519   GFRAA >60 08/01/2019 0519   Prenatal sickle cell screen negative UDS negative  Imaging I have reviewed the images obtained:  MRI brain with and without contrast pending CXR 2/21-no acute changes  Felicie Morn PA-C [facility] [phone]  M-F  (9:00 am- 5:00 PM)  08/01/2019, 12:28 PM   Assessment: This is a 24 year old female status post vaginal delivery with epidural.  Soon after delivery patient developed a low pressure headache most likely secondary to epidural.  Blood patch was administered with some relief but then recurrence of pain. Multiple medication such as migraine cocktail along with magnesium which did not seem to result in resolving headache.   Currently patient continues to have headache - likely low pressure headaches casued by CSF lead/intracranial hypotension  Hearing loss is more often related to IIHT (Idiopathic intracranial HYPERtension aka Pseudotumor cerebri), but can also be observed in patients with intracranial HYPOtension - due to CSF shifts. Above seems most plausible, but other differentials such as a structural brain lesion, PRES and CVST should also be kept in differentials.   Impression: -Intracranial Hypotension - Low pressure headache -Other differentials: CVST, PRES  Recommendations: -MRI brain with and without contrast -Caffeine intake can help-this has been explained to patient and provided with some caffeinated soft drinks - can also consider IV caffeine. -Likely will need second blood patch however will obtain MRI prior to this. -May consider CTV head if MRI remains unremarkable for evidence of intracranial hypotension. -Avoid opiates  Plan d/w Dr. Jolayne Panther over the phone.  Attending Neurohospitalist Addendum Patient seen and examined with APP/Resident. Agree with the history and physical as documented above. Agree with the plan as documented, which I helped formulate. I have independently reviewed the chart, obtained history, review of systems and examined the patient.I have personally reviewed pertinent head/neck/spine imaging (CT/MRI). Please feel free to call with any questions. --- Milon Dikes, MD [facility] Pager: [phone]  If 7pm to 7am, please call on call as listed on AMION.

## 2019-08-01 NOTE — Progress Notes (Signed)
Post Partum Day 6 Subjective: Patient reports feeling well with the exception of return of her headache this morning. She denies visual changes, RUQ/epigastric pain, nausea/emesis  Objective: Blood pressure 122/70, pulse 77, temperature 98.2 F (36.8 C), temperature source Oral, resp. rate 18, height 5\' 6"  (1.676 m), weight (!) 147.9 kg, SpO2 99 %, unknown if currently breastfeeding.  Physical Exam:  General: alert, cooperative and no distress Lochia: appropriate Uterine Fundus: firm DVT Evaluation: No evidence of DVT seen on physical exam.  Recent Labs    07/31/19 2052 08/01/19 0519  HGB 10.5* 10.3*  HCT 32.7* 31.9*    Assessment/Plan: Patient readmitted for postpartum pre-eclampsia - Continue magnesium sulfate for 24 hours  - Continue monitoring BP on Enalapril - headache cocktail this am - Continue current care   LOS: 1 day   Kristyna Bradstreet 08/01/2019, 9:25 AM

## 2019-08-02 DIAGNOSIS — O1415 Severe pre-eclampsia, complicating the puerperium: Principal | ICD-10-CM

## 2019-08-02 DIAGNOSIS — G96810 Intracranial hypotension, unspecified: Secondary | ICD-10-CM

## 2019-08-02 MED ORDER — GADOBUTROL 1 MMOL/ML IV SOLN
10.0000 mL | Freq: Once | INTRAVENOUS | Status: AC | PRN
  Administered 2019-08-02: 10 mL via INTRAVENOUS

## 2019-08-02 MED ORDER — CAFFEINE 200 MG PO TABS: 200.0000 mg | ORAL_TABLET | Freq: Two times a day (BID) | ORAL | 0 refills | Status: DC

## 2019-08-02 MED ORDER — ENALAPRIL MALEATE 5 MG PO TABS: 5.0000 mg | ORAL_TABLET | Freq: Every day | ORAL | 0 refills | Status: DC

## 2019-08-02 NOTE — Progress Notes (Signed)
Neurology progress note  Subjective: Feels better-no headache. Sitting comfortably in chair.  Objective: Awake alert oriented x3 No dysarthria No aphasia Cranial nerves 2-12 intact 5/5 all over with no drift Sensation intact Coordination with no dysmetria   Imaging: Reviewed personally MRI head with and without contrast completed.  Reviewed. Diffuse smooth pachymeningeal thickening and enhancement seen.  Given provided history of LP/epidural-likely reflects a mild CSF hypotension and/or post interventional changes from the epidural although did not reveal glaring characteristic intracranial hypotension findings.  No findings consistent with posterior reversible encephalopathy syndrome.  Also no evidence of dural venous sinus thrombosis.  Labs show mild elevation of alkaline phosphatase.  Low albumin.  Anemia hemoglobin 10.3.  Impression: Headaches-secondary to intracranial hypotension status post epidural catheter placement for delivery. Status post epidural blood patch few days ago  Recommendations: -Can give a prescription for p.o. caffeine 200 twice daily for a few days as needed. -Continue to use caffeinated drinks as tolerated.  Make sure that you are very well-hydrated-she expressed understanding. -Avoid strenuous activities.  Told her not to do heavy weight lifting and things of that nature for at least 7 days and then gradually return back to strenuous activities. -If has recurrence of headaches-will require additional blood patch.  Multiple epidural blood patches are required at times to control intracranial hypotension headaches but I do not anticipate she would need that.  Discussed with the patient and her partner at bedside  Please call neurology with questions.  -- Milon Dikes, MD [facility] Pager: [phone] If 7pm to 7am, please call on call as listed on AMION.

## 2019-08-02 NOTE — Discharge Summary (Signed)
OB Discharge Summary     Patient Name: Alexandra Collins DOB: 04-07-1996 MRN: [id number]  Date of admission: 07/31/2019 Delivering MD: This patient has no babies on file.  Date of discharge: 08/02/2019  Admitting diagnosis: Hypertension in pregnancy, preeclampsia, severe, delivered/postpartum [O14.15] Intrauterine pregnancy: Unknown     Secondary diagnosis:  Active Problems:   Hypertension in pregnancy, preeclampsia, severe, delivered/postpartum  Additional problems: intracranial hypotension- low pressure headaches     Discharge diagnosis: same as above                                                                                                 Hospital course:  Patient admitted with severe preeclampsia based on severe range BP and headache. Patient receive magnesium sulfate for 24 hours and was started on enalapril. She continued to have a severe headache which was positional (worst with sitting and ambulation). Patient with recent intrathecal epidural followed by blood patch. Neurology was consulted and recommended high dose caffeine which resolved her headache. MRI was negative. Patient found stable for discharge. Discharge instructions reviewed  Physical exam  Vitals:   08/01/19 2310 08/02/19 0200 08/02/19 0435 08/02/19 0839  BP: (!) 142/69 (!) 146/74 (!) 143/62 132/72  Pulse: 67 (!) 58 63 80  Resp: 20 19 20 18   Temp: 98.6 F (37 C)  98.8 F (37.1 C) 98 F (36.7 C)  TempSrc: Oral  Oral Oral  SpO2: 97% 98% 98% 100%  Weight:    (!) 143.3 kg  Height:       General: alert, cooperative and no distress Lochia: appropriate Uterine Fundus: firm DVT Evaluation: No evidence of DVT seen on physical exam. Labs: Lab Results  Component Value Date   WBC 7.5 08/01/2019   HGB 10.3 (L) 08/01/2019   HCT 31.9 (L) 08/01/2019   MCV 86.2 08/01/2019   PLT 429 (H) 08/01/2019   CMP Latest Ref Rng & Units 08/01/2019  Glucose 70 - 99 mg/dL 97  BUN 6 - 20 mg/dL 08/03/2019)  Creatinine  <5(M - 1.00 mg/dL 8.41  Sodium 3.24 - 401 mmol/L 139  Potassium 3.5 - 5.1 mmol/L 3.4(L)  Chloride 98 - 111 mmol/L 106  CO2 22 - 32 mmol/L 23  Calcium 8.9 - 10.3 mg/dL 8.1(L)  Total Protein 6.5 - 8.1 g/dL 6.3(L)  Total Bilirubin 0.3 - 1.2 mg/dL 0.4  Alkaline Phos 38 - 126 U/L 140(H)  AST 15 - 41 U/L 27  ALT 0 - 44 U/L 25    Discharge instruction: per After Visit Summary and "Baby and Me Booklet".  After visit meds:  Allergies as of 08/02/2019      Reactions   Ceclor [cefaclor] Hives   Garlic Hives   Only allergic to raw garlic   Penicillins Hives   Has patient had a PCN reaction causing immediate rash, facial/tongue/throat swelling, SOB or lightheadedness with hypotension: Yes Has patient had a PCN reaction causing severe rash involving mucus membranes or skin necrosis: Yes Has patient had a PCN reaction that required hospitalization: No Has patient had a PCN reaction occurring within the last 10 years:  No If all of the above answers are "NO", then may proceed with Cephalosporin use.   Shellfish Allergy Itching   Sulfamethoxazole Nausea And Vomiting   Sulfur Hives   Augmentin [amoxicillin-pot Clavulanate] Rash      Medication List    STOP taking these medications   aspirin 81 MG chewable tablet   calcium carbonate 500 MG chewable tablet Commonly known as: TUMS - dosed in mg elemental calcium   doxylamine (Sleep) 25 MG tablet Commonly known as: UNISOM   EPINEPHrine 0.3 mg/0.3 mL Soaj injection Commonly known as: EPI-PEN   RA ACETAMINOPHEN FLU COLD PO     TAKE these medications   acetaminophen 325 MG tablet Commonly known as: Tylenol Take 2 tablets (650 mg total) by mouth every 4 (four) hours as needed (for pain scale < 4).   albuterol 108 (90 Base) MCG/ACT inhaler Commonly known as: VENTOLIN HFA Inhale 2 puffs into the lungs every 6 (six) hours as needed for wheezing or shortness of breath.   caffeine 200 MG Tabs tablet Take 1 tablet (200 mg total) by mouth  every 12 (twelve) hours.   enalapril 5 MG tablet Commonly known as: VASOTEC Take 1 tablet (5 mg total) by mouth daily.   ibuprofen 600 MG tablet Commonly known as: ADVIL Take 1 tablet (600 mg total) by mouth every 6 (six) hours.   polyethylene glycol powder 17 GM/SCOOP powder Commonly known as: GLYCOLAX/MIRALAX Take 17 g by mouth daily.   prenatal multivitamin Tabs tablet Take 1 tablet by mouth daily at 12 noon.       Diet: routine diet  Activity: Advance as tolerated. Pelvic rest for 6 weeks.   Outpatient follow up:  Follow up Appt: Future Appointments  Date Time Provider [place]  08/03/2019  8:30 AM Chamberlayne Woodbine  08/24/2019  9:15 AM Luvenia Redden, PA-C [facility]   Follow up Visit:No follow-ups on file.    08/02/2019 Mora Bellman, MD

## 2019-08-03 ENCOUNTER — Ambulatory Visit (INDEPENDENT_AMBULATORY_CARE_PROVIDER_SITE_OTHER): Payer: BC Managed Care – PPO | Admitting: *Deleted

## 2019-08-03 ENCOUNTER — Other Ambulatory Visit: Payer: Self-pay

## 2019-08-03 VITALS — BP 164/97 | HR 61 | Wt 318.7 lb

## 2019-08-03 DIAGNOSIS — O1415 Severe pre-eclampsia, complicating the puerperium: Secondary | ICD-10-CM

## 2019-08-03 NOTE — Progress Notes (Signed)
Patient seen and assessed by nursing staff during this encounter. I have reviewed the chart and agree with the documentation and plan.  Jaynie Collins, MD 08/03/2019 10:13 AM

## 2019-08-03 NOTE — Progress Notes (Signed)
Here for bp check after IOL for GHTN , vaginal delivery 07/26/19. Then readmitted 07/31/19 for spinal headache and preeclampsia. Given Magnesium sulfate and caffeine po.  D/c 08/02/19. States no headache today, still has 1+ edema. Has not picked up Vasotec or caffeine yet, but plans to today. C/o perineal pain.Advised to use tylenol or ibuprofen prn and sitz bath.  BP's elevated today,discussed BP , assessment and history with Dr.Anyanwu. Instructed patient to pick up Vasotec and start today. Return in one week for bp check. Go [facility] if has return of severe headache and or increased edema. She voices understanding. Shrinika Blatz,RN

## 2019-08-09 ENCOUNTER — Ambulatory Visit: Payer: Self-pay

## 2019-08-09 NOTE — Lactation Note (Signed)
This note was copied from a baby's chart. Lactation Consultation Note  Patient Name: Alexandra Collins Date: 08/09/2019     08/09/2019  Name: Alexandra Collins MRN: [id number] Date of Birth: 07/26/2018 Gestational Age: Gestational Age: [redacted]w[redacted]d Birth Weight: 109.9 oz Weight today:  Weight: 7 lb 1 oz (3547 g)   55 week old infant presents today with mom for feeding assessment. Mom reports infant has never latched well. She has had a total of 2 good BF.   Infant has gained 177 grams in the last 11 days with an average daily weight gain of 16 grams a day.   Mom awakens infant during the day to feed every 3 hours, infant self awakens at night every 2-3 hours.   Infant latched to both breasts in the office. She did not sustain latch well and needed relatching several times. She sustained latch a little better with the # 24 nipple shield. She drools a lot on the bottle, enc mom to change to size 0 nipple for infant.   Infant with thick labial frenulum that inserts at the bottom of the gum ridge. Upper lip tight with flanging. Infant with posterior lingual frenulum that is causing some decreased mid tongue elevation. Infant has difficulty maintaining suction on the breast and the bottle and leaks a lot on the bottle. Infant is spitting a lot more per mom. Reviewed with mom how tongue and lip restrictions can effect milk supply and milk transfer. Website and local provider information given.   Infant to follow up with the [facility] April 15. Infant to follow up with Lactation in 2 weeks.   General Information: Mother's reason for visit: feeding assessment, diffucult latch Consult: Initial Lactation consultant: Noralee Stain RN,IBCLC Breastfeeding experience: not latching well Maternal medical conditions: Pregnancy induced hypertension(no breast growth with pregnancy, no areolar changes with pregnancy) Maternal medications: Pre-natal vitamin, Other(bp meds)  Breastfeeding  History: Frequency of breast feeding: attempts with each feeding Duration of feeding: few sucks with 2 good latches since birth  Supplementation: Supplement method: bottle(Tommie Tippee) Brand: Similac Formula volume: 2.5-3 ounces Formula frequency: every 2-3 hours   Breast milk volume: 1 ounce Breast milk frequency: BID   Pump type: Other(Motif) Pump frequency: 2 x a day, 15 minutes Pump volume: 1 ounce  Infant Output Assessment: Voids per 24 hours: 6-7 Urine color: Clear yellow Stools per 24 hours: 2 Stool color: Yellow  Breast Assessment: Breast: Soft, Compressible, Widely spaced, Other(no breast changes with pregnancy) Nipple: Erect Pain level: 0 Pain interventions: Bra, Other, Breast pump([employer] Nipple Butter)  Feeding Assessment: Infant oral assessment: Variance Infant oral assessment comment: see note Positioning: Cross cradle(left and right breast) Latch: 1 - Repeated attempts needed to sustain latch, nipple held in mouth throughout feeding, stimulation needed to elicit sucking reflex. Audible swallowing: 1 - A few with stimulation Type of nipple: 2 - Everted at rest and after stimulation Comfort: 2 - Soft/non-tender Hold: 1 - Assistance needed to correctly position infant at breast and maintain latch LATCH score: 7 Latch assessment: Deep Lips flanged: Yes Suck assessment: Displays both Tools: Nipple shield 24 mm Pre-feed weight: 3202 grams Post feed weight: 3214 grams Amount transferred: 12 ml Amount supplemented: 3 ounces formula via bottle  Additional Feeding Assessment:                                    Totals: Total amount transferred: 12 ml  Total supplement given: 90 ml EBM via bottle Total amount pumped post feed: did not pump   Plan:  1. Offer infant the breast with feeding cues as mom and infant is able  2. Keep infant awake with feeding as needed 3. Feed infant skin to skin 4. Massage/compress breast with feeding 5.  Offer both breasts with each feeding 6. Empty the first breast before offering the second breast 7. When offering the bottle, use the paced bottle feeding method (video on kellymom.com) 8. Infant needs about 60-80 ml (2-3 ounces) for 8 feedings a day or 480-640 ml (16-21 ounces) in 24 hours. Infant may take more or less at each feeding depending no how often she feeds. Feed infant until she is satisfied.  9. Try the size 0 nipple for your bottles  10. Would recommend that you pump about 8 times a day for 15-20 minutes to promote and protect your milk supply. 24. Herbs that may help is [place] or Mother Love More Milk Special Blend ([employer]) may help with milk production.  12. Keep up the good work 30. Thank you for allowing me to assist you today 14. Please call with any questions or concerns as needed [phone] 15. Follow up with Lactation in 2 weeks   Donn Pierini RN, IBCLC                                                        Debby Freiberg Ival Pacer 08/09/2019, 1:19 PM

## 2019-08-13 ENCOUNTER — Other Ambulatory Visit: Payer: Self-pay

## 2019-08-13 ENCOUNTER — Ambulatory Visit (INDEPENDENT_AMBULATORY_CARE_PROVIDER_SITE_OTHER): Payer: BC Managed Care – PPO

## 2019-08-13 VITALS — BP 128/85 | HR 96 | Wt 302.6 lb

## 2019-08-13 DIAGNOSIS — Z0130 Encounter for examination of blood pressure without abnormal findings: Secondary | ICD-10-CM

## 2019-08-13 NOTE — Progress Notes (Signed)
Pt here today for BP check s/p starting Vasotec 5 mg tablet for BP.  Pt reports that she has not taken the medication today because she takes it at night time.  Her last dose was at 11pm last night.  BP 128/85 LA.  Pt denies any sx's of HTN and that her swelling has decreased a lot. Pt asks if she can start taking her Clindamycin and Doxcycline for her condition that her dermatology dx her with.    Notified Dr. Shawnie Pons who recommends that the patient continue to take Vasotec 5 mg po daily, f/u with dermatologist as Doxcycline can affect lacation, and to call the office as needed before her pp visit scheduled on 08/24/19 in which the provider can evaluate her BP and discuss birth control.  Pt stated that she will contact her Dermatologist and did not have any other questions.   Addison Naegeli, RN  08/13/19

## 2019-08-13 NOTE — Progress Notes (Signed)
Patient seen and assessed by nursing staff.  Agree with documentation and plan.  

## 2019-08-24 ENCOUNTER — Other Ambulatory Visit: Payer: Self-pay

## 2019-08-24 ENCOUNTER — Encounter: Payer: Self-pay | Admitting: Medical

## 2019-08-24 ENCOUNTER — Ambulatory Visit (INDEPENDENT_AMBULATORY_CARE_PROVIDER_SITE_OTHER): Payer: BC Managed Care – PPO | Admitting: Medical

## 2019-08-24 VITALS — Ht 66.0 in | Wt 305.5 lb

## 2019-08-24 DIAGNOSIS — F329 Major depressive disorder, single episode, unspecified: Secondary | ICD-10-CM

## 2019-08-24 DIAGNOSIS — F32A Depression, unspecified: Secondary | ICD-10-CM

## 2019-08-24 DIAGNOSIS — Z8759 Personal history of other complications of pregnancy, childbirth and the puerperium: Secondary | ICD-10-CM | POA: Insufficient documentation

## 2019-08-24 NOTE — Patient Instructions (Addendum)
Postpartum Care After Vaginal Delivery This sheet gives you information about how to care for yourself from the time you deliver your baby to up to 6-12 weeks after delivery (postpartum period). Your health care provider may also give you more specific instructions. If you have problems or questions, contact your health care provider. Follow these instructions at home: Vaginal bleeding  It is normal to have vaginal bleeding (lochia) after delivery. Wear a sanitary pad for vaginal bleeding and discharge. ? During the first week after delivery, the amount and appearance of lochia is often similar to a menstrual period. ? Over the next few weeks, it will gradually decrease to a dry, yellow-brown discharge. ? For most women, lochia stops completely by 4-6 weeks after delivery. Vaginal bleeding can vary from woman to woman.  Change your sanitary pads frequently. Watch for any changes in your flow, such as: ? A sudden increase in volume. ? A change in color. ? Large blood clots.  If you pass a blood clot from your vagina, save it and call your health care provider to discuss. Do not flush blood clots down the toilet before talking with your health care provider.  Do not use tampons or douches until your health care provider says this is safe.  If you are not breastfeeding, your period should return 6-8 weeks after delivery. If you are feeding your child breast milk only (exclusive breastfeeding), your period may not return until you stop breastfeeding. Perineal care  Keep the area between the vagina and the anus (perineum) clean and dry as told by your health care provider. Use medicated pads and pain-relieving sprays and creams as directed.  If you had a cut in the perineum (episiotomy) or a tear in the vagina, check the area for signs of infection until you are healed. Check for: ? More redness, swelling, or pain. ? Fluid or blood coming from the cut or tear. ? Warmth. ? Pus or a bad  smell.  You may be given a squirt bottle to use instead of wiping to clean the perineum area after you go to the bathroom. As you start healing, you may use the squirt bottle before wiping yourself. Make sure to wipe gently.  To relieve pain caused by an episiotomy, a tear in the vagina, or swollen veins in the anus (hemorrhoids), try taking a warm sitz bath 2-3 times a day. A sitz bath is a warm water bath that is taken while you are sitting down. The water should only come up to your hips and should cover your buttocks. Breast care  Within the first few days after delivery, your breasts may feel heavy, full, and uncomfortable (breast engorgement). Milk may also leak from your breasts. Your health care provider can suggest ways to help relieve the discomfort. Breast engorgement should go away within a few days.  If you are breastfeeding: ? Wear a bra that supports your breasts and fits you well. ? Keep your nipples clean and dry. Apply creams and ointments as told by your health care provider. ? You may need to use breast pads to absorb milk that leaks from your breasts. ? You may have uterine contractions every time you breastfeed for up to several weeks after delivery. Uterine contractions help your uterus return to its normal size. ? If you have any problems with breastfeeding, work with your health care provider or lactation consultant.  If you are not breastfeeding: ? Avoid touching your breasts a lot. Doing this can make   your breasts produce more milk. ? Wear a good-fitting bra and use cold packs to help with swelling. ? Do not squeeze out (express) milk. This causes you to make more milk. Intimacy and sexuality  Ask your health care provider when you can engage in sexual activity. This may depend on: ? Your risk of infection. ? How fast you are healing. ? Your comfort and desire to engage in sexual activity.  You are able to get pregnant after delivery, even if you have not had  your period. If desired, talk with your health care provider about methods of birth control (contraception). Medicines  Take over-the-counter and prescription medicines only as told by your health care provider.  If you were prescribed an antibiotic medicine, take it as told by your health care provider. Do not stop taking the antibiotic even if you start to feel better. Activity  Gradually return to your normal activities as told by your health care provider. Ask your health care provider what activities are safe for you.  Rest as much as possible. Try to rest or take a nap while your baby is sleeping. Eating and drinking   Drink enough fluid to keep your urine pale yellow.  Eat high-fiber foods every day. These may help prevent or relieve constipation. High-fiber foods include: ? Whole grain cereals and breads. ? Brown rice. ? Beans. ? Fresh fruits and vegetables.  Do not try to lose weight quickly by cutting back on calories.  Take your prenatal vitamins until your postpartum checkup or until your health care provider tells you it is okay to stop. Lifestyle  Do not use any products that contain nicotine or tobacco, such as cigarettes and e-cigarettes. If you need help quitting, ask your health care provider.  Do not drink alcohol, especially if you are breastfeeding. General instructions  Keep all follow-up visits for you and your baby as told by your health care provider. Most women visit their health care provider for a postpartum checkup within the first 3-6 weeks after delivery. Contact a health care provider if:  You feel unable to cope with the changes that your child brings to your life, and these feelings do not go away.  You feel unusually sad or worried.  Your breasts become red, painful, or hard.  You have a fever.  You have trouble holding urine or keeping urine from leaking.  You have little or no interest in activities you used to enjoy.  You have not  breastfed at all and you have not had a menstrual period for 12 weeks after delivery.  You have stopped breastfeeding and you have not had a menstrual period for 12 weeks after you stopped breastfeeding.  You have questions about caring for yourself or your baby.  You pass a blood clot from your vagina. Get help right away if:  You have chest pain.  You have difficulty breathing.  You have sudden, severe leg pain.  You have severe pain or cramping in your lower abdomen.  You bleed from your vagina so much that you fill more than one sanitary pad in one hour. Bleeding should not be heavier than your heaviest period.  You develop a severe headache.  You faint.  You have blurred vision or spots in your vision.  You have bad-smelling vaginal discharge.  You have thoughts about hurting yourself or your baby. If you ever feel like you may hurt yourself or others, or have thoughts about taking your own life, get help  help right away. You can go to the nearest emergency department or call:  Your local emergency services (911 in the [place]  A suicide crisis helpline, such as the National Suicide Prevention Lifeline at 1-800-273-8255. This is open 24 hours a day. Summary  The period of time right after you deliver your newborn up to 6-12 weeks after delivery is called the postpartum period.  Gradually return to your normal activities as told by your health care provider.  Keep all follow-up visits for you and your baby as told by your health care provider. This information is not intended to replace advice given to you by your health care provider. Make sure you discuss any questions you have with your health care provider. Document Revised: 04/29/2017 Document Reviewed: 02/07/2017 Elsevier Patient Education  2020 Elsevier Inc.  [facility]  [address], [place], [place] [phone]  [facility] [address], [place], [place] Helpline [phone] or 1-800-711-2635 [email or web address]  [facility] Mon-Fri, 8:30-5:00 [address], [place], [place] [phone] [email or web address]  *Bring your own interpreter at 1st visit  [facility] [address], [place], [place] [phone] [email or web address]   [facility]  [address], [place], [place] [phone]  [facility] Mon-Fri, 8am-3pm [address], [place], [place] [phone] [email or web address]  Therapy/Counseling  [facility] [address], [place], [place] [phone]  [facility] [address], [place], [place] [phone]  [facility] [address], [place], [place] [phone] *pacientes que hablen espanol, favor comunicarse con el Sr. [place], extension 2244 o la [address], extension 3331 para hacer una cita.   [facility] [address] "[place]" [phone] (Habla Espanol)  [facility] [address], [place], [place] [phone]  [facility] [address], [place], [place] [phone]   [facility])  [address], [place], [place] [phone] [email or web address]  *Uninsured and underinsured, ages 19-64  [facility] [address], [place], [place] [phone] (Habla Espanol)  [facility] [address], [place], [place] [phone] (Habla Espanol)  [facility] [address], [place], [place] [phone] (Habla Espanol)  [facility] Mon-Thurs 8:30am-8:00pm/ Fri 8:30-7:00pm [address], [place], [place] (3rd floor, located at corner of [address]) Call [phone] to schedule an appointment [email or web address]  [facility] [address], [place], [place]  [phone]  [facility]  [address], [place], [place] [phone]  Social Support [facility] ([facility] [place]) (336) 373-1402 or www.mhag.org [address], [place], [place] [address] * Recovery support and educational programs, including recovery skills classes, support groups, and one-on-one sessions with [facility] Certified Peer Support Specialists.    NAMI (National Alliance of the Mentally Ill) [place] NAMI Helpline: (336) 370-4264 * Family and Friends Support Group/  Contact Jack Glenn at [phone] for more information * Family to Family Education Program and Basics Class : enroll online or email Mary Pennington at [email or web address]  * Monthly educational meetings, contact Mitch McGee at [phone] [email or web address]    24- Hour Availability:  *[facility] 336-832-9700 or 1-800-711-2635  * [facility] Crisis (Domestic Violence, Rape, Victim Assistance) Line 336-273-7273  * [facility] 1-855-788-8787 or 336-676-6840  * [facility] [place] [facility]  [phone](8am-4pm only) 1-866-261-5769(after hours)  *[facility]   Crisis Unit 1-877-626-1772  *[place] National Suicide Hotline 1-800-273-8255   

## 2019-08-24 NOTE — Progress Notes (Signed)
    Post Partum Visit Note  Alexandra [place] is a 24 y.o. [id number] female who presents for a postpartum visit. She is 4 weeks postpartum following a normal spontaneous vaginal delivery.  I have fully reviewed the prenatal and intrapartum course. The delivery was at 38/4 gestational weeks.  Anesthesia: epidural. Postpartum course has been complicated by readmission for severe PP pre-eclampsia. Baby is doing well. Baby is feeding by both breast and bottle - Carnation Good Start. Bleeding no bleeding. Bowel function is normal. Bladder function is normal. Patient is not sexually active. Contraception method is none. Postpartum depression screening: positive.  The following portions of the patient's history were reviewed and updated as appropriate: allergies, current medications, past family history, past medical history, past social history, past surgical history and problem list.  Review of Systems Pertinent items are noted in HPI.    Objective:  Height 5\' 6"  (1.676 m), weight (!) 305 lb 8 oz (138.6 kg), unknown if currently breastfeeding.  General:  alert and cooperative   Breasts:  not performed, patient is not breastfeeding  Lungs: normal effort  Heart:  normal rate  Abdomen: soft   Vulva:  not evaluated  Vagina: not evaluated  Cervix:  not evaluated  Corpus: not examined  Adnexa:  not evaluated  Rectal Exam: Not performed.        Assessment:   Normal postpartum exam H/O Gestational HTN H/O Severe PP Pre-eclampsia   Plan:   Essential components of care per ACOG recommendations:  1.  Mood and well being: Patient with positive depression screening today. Reviewed local resources for support.  - Patient does not use tobacco.  - hx of drug use? No    2. Infant care and feeding:  -Patient currently breastmilk feeding? No  -Social determinants of health (SDOH) reviewed in EPIC. No concerns  3. Sexuality, contraception and birth spacing - Patient does not want a pregnancy in  the next year.  Desired family size is 2 children.  - Reviewed forms of contraception in tiered fashion. Patient desired Nexplanon. Unable to provide urine sample and requested to return at a later date for insertion.  - Discussed birth spacing of 18 months - Abstinence until 6 weeks PP recommended   4. Sleep and fatigue -Encouraged family/partner/community support of 4 hrs of uninterrupted sleep to help with mood and fatigue  5. Physical Recovery  - Discussed patients delivery and complications - Patient had left sulcus laceration, perineal healing reviewed. Patient expressed understanding - Patient has urinary incontinence? No  - Patient is not safe to resume physical and sexual activity  6.  Health Maintenance - Last pap smear done 09/2018 and was normal with negative HPV.  7. History of GHTN and PP Pre-eclampsia  - Continue Vasotec and BP check in 3-4 weeks   10/2018, PA-C [facility], [facility]

## 2019-08-25 ENCOUNTER — Other Ambulatory Visit: Payer: Self-pay | Admitting: Obstetrics and Gynecology

## 2019-08-26 ENCOUNTER — Encounter: Payer: Self-pay | Admitting: Medical

## 2019-08-28 NOTE — BH Specialist Note (Signed)
Integrated Behavioral Health via Telemedicine Video Visit  08/28/2019 Alexandra [place] [id number]  Number of Integrated Behavioral Health visits: 1 Session Start time: 2:18  Session End time: 2:36 Total time: 18  Referring Provider: Kerry Hough, PA-C Type of Visit: Video Patient/Family location: Home [facility] Provider location: [facility] All persons participating in visit: Patient Alexandra [place] and [place]    Confirmed patient's address: Yes  Confirmed patient's phone number: Yes  Any changes to demographics: No   Confirmed patient's insurance: Yes  Any changes to patient's insurance: No   Discussed confidentiality: Yes   I connected with Esperanza Richters by a video enabled telemedicine application and verified that I am speaking with the correct person using two identifiers.     I discussed the limitations of evaluation and management by telemedicine and the availability of in person appointments.  I discussed that the purpose of this visit is to provide behavioral health care while limiting exposure to the novel coronavirus.   Discussed there is a possibility of technology failure and discussed alternative modes of communication if that failure occurs.  I discussed that engaging in this video visit, they consent to the provision of behavioral healthcare and the services will be billed under their insurance.  Patient and/or legal guardian expressed understanding and consented to video visit: Yes   PRESENTING CONCERNS: Patient and/or family reports the following symptoms/concerns: Pt states she has coped with depression in the past by writing in a journal and Bible reading, and is not feeling depressed postpartum; pt's goal is to go back to work.  Duration of problem: Postpartum; Severity of problem: n/a  STRENGTHS (Protective Factors/Coping Skills): Good social support; has self-coping strategies in place if needed  GOALS ADDRESSED: Patient will: 1.   Maintain reduction of symptoms of: depression  2.  Increase knowledge and/or ability of: healthy habits  3.  Demonstrate ability to: Increase healthy adjustment to current life circumstances and Increase adequate support systems for patient/family  INTERVENTIONS: Interventions utilized:  Supportive Counseling, Psychoeducation and/or Health Education and Link to [employer] Standardized Assessments completed: GAD-7 and PHQ 9  ASSESSMENT: Patient currently experiencing At risk of depression postpartum.   Patient may benefit from psychoeducation and brief therapeutic interventions regarding maintaining reduction of depressive symptoms .  PLAN: 1. Follow up with behavioral health clinician on : As needed 2. Behavioral recommendations:  -Continue taking prenatal vitamin daily until at least postpartum visit -Consider registering for and attending new mom support group at [email or web address] (Mom Talk) -Consider resuming daily journal and Bible reading -Continue plan to resume work as soon as medically cleared 3. Referral(s): Integrated Orthoptist (In Clinic) and [employer] Resources:  New mom support  I discussed the assessment and treatment plan with the patient and/or parent/guardian. They were provided an opportunity to ask questions and all were answered. They agreed with the plan and demonstrated an understanding of the instructions.   They were advised to call back or seek an in-person evaluation if the symptoms worsen or if the condition fails to improve as anticipated.  Caroleen Hamman [facility]  Depression screen [facility] 2/9 09/04/2019 08/29/2019 07/23/2019 07/16/2019 07/09/2019  Decreased Interest 0 0 0 0 0  Down, Depressed, Hopeless 0 0 0 0 0  PHQ - 2 Score 0 0 0 0 0  Altered sleeping 0 0 0 0 0  Tired, decreased energy 1 0 0 0 0  Change in appetite 0 0 0 0 0  Feeling bad or failure about yourself  0 0 0 0 0  Trouble concentrating 0 0 0 0 0  Moving slowly or  fidgety/restless 0 - 0 0 0  Suicidal thoughts 0 0 0 0 0  PHQ-9 Score 1 0 0 0 0   GAD 7 : Generalized Anxiety Score 09/04/2019 08/29/2019 07/23/2019 07/16/2019  Nervous, Anxious, on Edge 1 0 0 0  Control/stop worrying 0 0 0 0  Worry too much - different things 0 0 0 0  Trouble relaxing 1 0 0 0  Restless 0 0 0 0  Easily annoyed or irritable 0 0 0 0  Afraid - awful might happen 0 0 0 0  Total GAD 7 Score 2 0 0 0

## 2019-08-29 ENCOUNTER — Encounter: Payer: Self-pay | Admitting: Family Medicine

## 2019-08-29 ENCOUNTER — Ambulatory Visit (INDEPENDENT_AMBULATORY_CARE_PROVIDER_SITE_OTHER): Payer: BC Managed Care – PPO | Admitting: Family Medicine

## 2019-08-29 ENCOUNTER — Other Ambulatory Visit: Payer: Self-pay

## 2019-08-29 ENCOUNTER — Ambulatory Visit: Payer: BC Managed Care – PPO | Admitting: *Deleted

## 2019-08-29 VITALS — BP 137/86 | HR 99 | Ht 66.0 in | Wt 305.9 lb

## 2019-08-29 DIAGNOSIS — N949 Unspecified condition associated with female genital organs and menstrual cycle: Secondary | ICD-10-CM

## 2019-08-29 NOTE — Patient Instructions (Signed)

## 2019-08-29 NOTE — Progress Notes (Signed)
GYNECOLOGY OFFICE NOTE  History:  24 y.o. G1P1001 here today for acute gyn visit..    She had a vaginal delivery on 07/26/19, left sulcus tear that was repaired with 3-0 chromic. She reports that 2 days ago she had an episode of burning in the base of her vagina. She felt down to inspect it, and felt a string. She is concerned because when she looked with her phone and took a picture, she noticed what looked like a hole at the base of her vagina. She denies dysuria, vaginal discharge, or worsening lochia.  Past Medical History:  Diagnosis Date  . Anemia   . Hydradenitis     Past Surgical History:  Procedure Laterality Date  . KNEE ARTHROSCOPY Right      Current Outpatient Medications:  .  acetaminophen (TYLENOL) 325 MG tablet, Take 2 tablets (650 mg total) by mouth every 4 (four) hours as needed (for pain scale < 4)., Disp: 30 tablet, Rfl: 0 .  albuterol (VENTOLIN HFA) 108 (90 Base) MCG/ACT inhaler, Inhale 2 puffs into the lungs every 6 (six) hours as needed for wheezing or shortness of breath., Disp: , Rfl:  .  aspirin 81 MG chewable tablet, Chew by mouth daily., Disp: , Rfl:  .  ibuprofen (ADVIL) 600 MG tablet, Take 1 tablet (600 mg total) by mouth every 6 (six) hours., Disp: 30 tablet, Rfl: 0 .  Prenatal Vit-Fe Fumarate-FA (PRENATAL MULTIVITAMIN) TABS tablet, Take 1 tablet by mouth daily at 12 noon., Disp: , Rfl:  .  caffeine 200 MG TABS tablet, Take 1 tablet (200 mg total) by mouth every 12 (twelve) hours. (Patient not taking: Reported on 08/03/2019), Disp: 14 tablet, Rfl: 0 .  enalapril (VASOTEC) 5 MG tablet, Take 1 tablet (5 mg total) by mouth daily. (Patient not taking: Reported on 08/03/2019), Disp: 30 tablet, Rfl: 0 .  polyethylene glycol powder (GLYCOLAX/MIRALAX) 17 GM/SCOOP powder, Take 17 g by mouth daily. (Patient not taking: Reported on 08/29/2019), Disp: 510 g, Rfl: 1  The following portions of the patient's history were reviewed and updated as appropriate: allergies,  current medications, past family history, past medical history, past social history, past surgical history and problem list.   Review of Systems:  Pertinent items noted in HPI and remainder of comprehensive ROS otherwise negative.   Objective:  Physical Exam BP 137/86   Pulse 99   Ht 5\' 6"  (1.676 m)   Wt (!) 305 lb 14.4 oz (138.8 kg)   BMI 49.37 kg/m  CONSTITUTIONAL: Well-developed, well-nourished female in no acute distress.  HENT:  Normocephalic, atraumatic. External right and left ear normal. Oropharynx is clear and moist EYES: Conjunctivae and EOM are normal. Pupils are equal, round, and reactive to light. No scleral icterus.  NECK: Normal range of motion, supple, no masses SKIN: Skin is warm and dry. No rash noted. Not diaphoretic. No erythema. No pallor. NEUROLOGIC: Alert and oriented to person, place, and time. Normal reflexes, muscle tone coordination. No cranial nerve deficit noted. PSYCHIATRIC: Normal mood and affect. Normal behavior. Normal judgment and thought content. CARDIOVASCULAR: Normal heart rate noted RESPIRATORY: Effort and breath sounds normal, no problems with respiration noted ABDOMEN: Soft, no distention noted.   PELVIC: Normal appearing external genitalia; sulcal laceration healing well, small piece of suture visible in the vagina (not long enough to trim). Normal appearing vaginal mucosa and cervix.  No abnormal discharge noted- some dried blood. No vagina wall defects or perineal defects felt on digital palpation. MUSCULOSKELETAL: Normal range of  motion. No edema noted.  Exam done with chaperone present.  Labs and Imaging MR BRAIN W WO CONTRAST  Result Date: 08/02/2019 CLINICAL DATA:  Initial evaluation for acute recurrent headache, 6 days postpartum for vaginal delivery, epidural administered. Recently underwent blood patch with subsequent improvement headache, which is now returned. Headaches are postural in nature. EXAM: MRI HEAD WITHOUT AND WITH CONTRAST  TECHNIQUE: Multiplanar, multiecho pulse sequences of the brain and surrounding structures were obtained without and with intravenous contrast. CONTRAST:  71mL GADAVIST GADOBUTROL 1 MMOL/ML IV SOLN COMPARISON:  None available. FINDINGS: Brain: Cerebral volume within normal limits for age. No focal parenchymal signal abnormality identified. No findings to suggest PRES. No abnormal foci of restricted diffusion to suggest acute or subacute ischemia. Gray-white matter differentiation well maintained. No encephalomalacia to suggest chronic cortical infarction. No foci of susceptibility artifact to suggest acute or chronic intracranial hemorrhage. No mass lesion, midline shift, or mass effect. Ventricles normal size without hydrocephalus. There is diffuse smooth pachymeningeal thickening and enhancement about the brain. No appreciable leptomeningeal or parenchymal enhancement. No subdural hygroma or other collection. Cerebellar tonsils remain normally positioned above the foramen magnum without significant sagging of the midbrain structures. Pituitary gland is prominent. Midline structures intact and normal. Vascular: Major intracranial vascular flow voids are maintained. Specific note made of normal flow voids throughout the dural sinuses, with no secondary findings to suggest dural sinus thrombosis. Skull and upper cervical spine: Craniocervical junction within normal limits. Visualized upper cervical spine normal. Bone marrow signal intensity within normal limits. No scalp soft tissue abnormality. Sinuses/Orbits: Globes and orbital soft tissues within normal limits. Paranasal sinuses are clear. No significant mastoid effusion. Inner ear structures normal. Other: None. IMPRESSION: 1. Diffuse smooth pachymeningeal thickening and enhancement. Given provided history, finding likely reflects a degree of mild CSF hypotension and/or post intervention changes from prior epidural, although no other definite characteristic  findings for CSF hypotension are seen. 2. Otherwise normal brain MRI. Specifically, there is no evidence for PRES, dural sinus thrombosis, or other structure abnormality that could potentially explain headaches in this postpartum patient. Electronically Signed   By: Jeannine Boga M.D.   On: 08/02/2019 01:44   DG CHEST PORT 1 VIEW  Result Date: 07/31/2019 CLINICAL DATA:  24 year old female with chest pain. EXAM: PORTABLE CHEST 1 VIEW COMPARISON:  Chest radiograph dated 07/21/2018. FINDINGS: Shallow inspiration. No focal consolidation, pleural effusion or pneumothorax. Mild enlargement of the cardiopericardial silhouette which may be accentuated by shallow inspiration. No acute osseous pathology. IMPRESSION: No acute cardiopulmonary process. Electronically Signed   By: Anner Crete M.D.   On: 07/31/2019 21:57    Assessment & Plan:  1. Vaginal burning -only one episode, likely irritation from sutures vs dried blood -reassurance given, advised vaginal moisturizers vs lubrications vs dermoplast for discomfort -return precautions given  Routine preventative health maintenance measures emphasized. Please refer to After Visit Summary for other counseling recommendations.   Return for 3-4 weeks BP check (pp severe pre-E).  Total face-to-face time with patient: 9 minutes. Over 50% of encounter was spent on counseling and coordination of care.  Merilyn Baba, DO OB Fellow, [facility] 08/29/2019 3:05 PM

## 2019-08-30 ENCOUNTER — Ambulatory Visit: Payer: BC Managed Care – PPO | Attending: Family

## 2019-08-30 DIAGNOSIS — Z23 Encounter for immunization: Secondary | ICD-10-CM

## 2019-08-30 NOTE — Progress Notes (Signed)
   Covid-19 Vaccination Clinic  Name:  Alexandra Collins    MRN: [id number] DOB: 1995-07-08  08/30/2019  [name] was observed post Covid-19 immunization for 15 minutes without incident. She was provided with Vaccine Information Sheet and instruction to access the V-Safe system.   [name] was instructed to call 911 with any severe reactions post vaccine: Marland Kitchen Difficulty breathing  . Swelling of face and throat  . A fast heartbeat  . A bad rash all over body  . Dizziness and weakness   Immunizations Administered    Name Date Dose VIS Date Route   Moderna COVID-19 Vaccine 08/30/2019  1:18 PM 0.5 mL 04/2019 Intramuscular   Manufacturer: Moderna   Lot: [id number]   NDC: 04591-368-59

## 2019-09-04 ENCOUNTER — Ambulatory Visit (INDEPENDENT_AMBULATORY_CARE_PROVIDER_SITE_OTHER): Payer: BC Managed Care – PPO | Admitting: Clinical

## 2019-09-04 DIAGNOSIS — O99345 Other mental disorders complicating the puerperium: Secondary | ICD-10-CM

## 2019-09-04 DIAGNOSIS — Z8659 Personal history of other mental and behavioral disorders: Secondary | ICD-10-CM | POA: Diagnosis not present

## 2019-09-04 DIAGNOSIS — F329 Major depressive disorder, single episode, unspecified: Secondary | ICD-10-CM | POA: Diagnosis not present

## 2019-09-04 NOTE — Patient Instructions (Signed)
  New mom online support:   Www.conehealthybaby.com (Mom Talk)  /Emotional Wellbeing Apps and Websites Here are a few free apps meant to help you to help yourself.  To find, try searching on the internet to see if the app is offered on Apple/Android devices. If your first choice doesn't come up on your device, the good news is that there are many choices! Play around with different apps to see which ones are helpful to you.    Calm This is an app meant to help increase calm feelings. Includes info, strategies, and tools for tracking your feelings.      Calm Harm  This app is meant to help with self-harm. Provides many 5-minute or 15-min coping strategies for doing instead of hurting yourself.       Healthy Minds Health Minds is a problem-solving tool to help deal with emotions and cope with stress you encounter wherever you are.      MindShift This app can help people cope with anxiety. Rather than trying to avoid anxiety, you can make an important shift and face it.      MY3  MY3 features a support system, safety plan and resources with the goal of offering a tool to use in a time of need.       My Life My Voice  This mood journal offers a simple solution for tracking your thoughts, feelings and moods. Animated emoticons can help identify your mood.       Relax Melodies Designed to help with sleep, on this app you can mix sounds and meditations for relaxation.      Smiling Mind Smiling Mind is meditation made easy: it's a simple tool that helps put a smile on your mind.        Stop, Breathe & Think  A friendly, simple guide for people through meditations for mindfulness and compassion.  Stop, Breathe and Think Kids Enter your current feelings and choose a "mission" to help you cope. Offers videos for certain moods instead of just sound recordings.       Team Orange The goal of this tool is to help teens change how they think, act, and react. This app  helps you focus on your own good feelings and experiences.      The [employer] Box The [employer] Station[employer] Box (VHB) contains simple tools to help patients with coping, relaxation, distraction, and positive thinking.

## 2019-09-12 ENCOUNTER — Other Ambulatory Visit: Payer: Self-pay

## 2019-09-12 ENCOUNTER — Ambulatory Visit (INDEPENDENT_AMBULATORY_CARE_PROVIDER_SITE_OTHER): Payer: BC Managed Care – PPO

## 2019-09-12 VITALS — BP 137/83 | HR 89

## 2019-09-12 DIAGNOSIS — Z0130 Encounter for examination of blood pressure without abnormal findings: Secondary | ICD-10-CM

## 2019-09-12 NOTE — Progress Notes (Signed)
Pt here today for BP check s/p the start of enalapril 5 mg tablet for elevated BP.  Pt reports that she has not taken medication in the past two days.  Pt denies any sx's of HTN.  BP LA 137/83.  Reviewed BP and that pt has not taken medication in a couple days with Dr. Shawnie Pons.  Provider recommendation is that pt can stop taking enalapril, continue to monitor for sx's of HTN, and if she gets sx's to check her blood pressure as if it it elevated we may need to refer her to primary care.  Notified pt provider's recommendation.  Pt verbalized understanding.    Addison Naegeli, RN  09/12/19

## 2019-09-13 NOTE — Progress Notes (Signed)
Patient seen and assessed by nursing staff.  Agree with documentation and plan.  

## 2019-09-25 ENCOUNTER — Ambulatory Visit: Payer: BC Managed Care – PPO | Attending: Family

## 2019-09-25 DIAGNOSIS — Z23 Encounter for immunization: Secondary | ICD-10-CM

## 2019-09-25 NOTE — Progress Notes (Signed)
   Covid-19 Vaccination Clinic  Name:  Alexandra Collins    MRN: [id number] DOB: Apr 05, 1996  09/25/2019  [name] was observed post Covid-19 immunization for 15 minutes without incident. She was provided with Vaccine Information Sheet and instruction to access the V-Safe system.   [name] was instructed to call 911 with any severe reactions post vaccine: Marland Kitchen Difficulty breathing  . Swelling of face and throat  . A fast heartbeat  . A bad rash all over body  . Dizziness and weakness   Immunizations Administered    Name Date Dose VIS Date Route   Moderna COVID-19 Vaccine 09/25/2019 12:51 PM 0.5 mL 04/2019 Intramuscular   Manufacturer: Moderna   Lot: [id number]   NDC: 98921-194-17

## 2019-10-23 ENCOUNTER — Ambulatory Visit
Admission: EM | Admit: 2019-10-23 | Discharge: 2019-10-23 | Disposition: A | Discharge status: Home / Self Care | Payer: BC Managed Care – PPO | Attending: Emergency Medicine | Admitting: Emergency Medicine

## 2019-10-23 DIAGNOSIS — H5789 Other specified disorders of eye and adnexa: Secondary | ICD-10-CM | POA: Diagnosis not present

## 2019-10-23 MED ORDER — LUBRICANT EYE DROPS 0.4-0.3 % OP SOLN: 1.0000 [drp] | Freq: Three times a day (TID) | OPHTHALMIC | 0 refills | Status: DC | PRN

## 2019-10-23 MED ORDER — POLYMYXIN B-TRIMETHOPRIM 10000-0.1 UNIT/ML-% OP SOLN: 1.0000 [drp] | OPHTHALMIC | 0 refills | Status: DC

## 2019-10-23 NOTE — Discharge Instructions (Signed)
Use eyedrops as directed on eye(s) as prescribed.  May use artificial tear gel/drops at needed. °Important to use artificial tear gel/drops last and wait 10-15 minutes between drops as it can prevent your prescription drops from working properly.  °Important to follow up with Ophthalmology (eye doctor). °Return sooner for worsening of symptoms, change in vision, sensitivity to light, eye swelling, painful eye movement, or fever.  °

## 2019-10-23 NOTE — ED Provider Notes (Signed)
[facility]    CSN: [id number] Arrival date & time: 10/23/19  1216      History   Chief Complaint Chief Complaint  Patient presents with  . Eye Drainage    HPI Alexandra Collins is a 24 y.o. female presenting for right eye irritation and discharge.  States it started yesterday morning.  Patient works in a daycare.  Does not wear contact lenses or glasses.  No visual changes, fever, photophobia.   Past Medical History:  Diagnosis Date  . Anemia   . Hydradenitis     Patient Active Problem List   Diagnosis Date Noted  . History of gestational hypertension 08/24/2019  . History of pre-eclampsia 08/24/2019    Past Surgical History:  Procedure Laterality Date  . KNEE ARTHROSCOPY Right     OB History    Gravida  1   Para  1   Term  1   Preterm      AB      Living  1     SAB      TAB      Ectopic      Multiple  0   Live Births  1            Home Medications    Prior to Admission medications   Medication Sig Start Date End Date Taking? Authorizing Provider  Polyethyl Glycol-Propyl Glycol (LUBRICANT EYE DROPS) 0.4-0.3 % SOLN Apply 1 drop to eye 3 (three) times daily as needed. 10/23/19   Hall-Potvin, [place], PA-C  Prenatal Vit-Fe Fumarate-FA (PRENATAL MULTIVITAMIN) TABS tablet Take 1 tablet by mouth daily at 12 noon.    [provider]  trimethoprim-polymyxin b (POLYTRIM) ophthalmic solution Place 1 drop into the left eye every 4 (four) hours. 10/23/19   Hall-Potvin, [place], PA-C    Family History Family History  Problem Relation Age of Onset  . Hypertension Mother   . Seizures Father   . Hypertension Father     Social History Social History   Tobacco Use  . Smoking status: Never Smoker  . Smokeless tobacco: Never Used  Vaping Use  . Vaping Use: Never used  Substance Use Topics  . Alcohol use: No  . Drug use: No     Allergies   Ceclor [cefaclor], Garlic, Penicillins, Shellfish allergy,  Sulfamethoxazole, Sulfur, and Augmentin [amoxicillin-pot clavulanate]   Review of Systems As per HPI   Physical Exam Triage Vital Signs ED Triage Vitals  Enc Vitals Group     BP      Pulse      Resp      Temp      Temp src      SpO2      Weight      Height      Head Circumference      Peak Flow      Pain Score      Pain Loc      Pain Edu?      Excl. in GC?    No data found.  Updated Vital Signs BP (!) 154/93 (BP Location: Left Arm)   Pulse 93   Temp 98.8 F (37.1 C) (Oral)   Resp 18   SpO2 97%   Breastfeeding No   Visual Acuity Right Eye Distance: 20/30 Left Eye Distance: 20/30 Bilateral Distance: 20/30  Right Eye Near:   Left Eye Near:    Bilateral Near:     Physical Exam Constitutional:      General:  She is not in acute distress.    Appearance: Normal appearance. She is not ill-appearing.  Eyes:     General: Lids are normal. Lids are everted, no foreign bodies appreciated. Vision grossly intact. Gaze aligned appropriately. No visual field deficit or scleral icterus.       Right eye: No foreign body, discharge or hordeolum.        Left eye: No foreign body, discharge or hordeolum.     Extraocular Movements: Extraocular movements intact.     Right eye: No nystagmus.     Left eye: No nystagmus.     Conjunctiva/sclera:     Right eye: Right conjunctiva is not injected. No chemosis, exudate or hemorrhage.    Left eye: Left conjunctiva is not injected. No chemosis, exudate or hemorrhage.    Pupils: Pupils are equal, round, and reactive to light.     Comments: Right conjunctival injection with irritation.  No foreign body with lid retraction.  Cardiovascular:     Rate and Rhythm: Normal rate.  Pulmonary:     Effort: Pulmonary effort is normal.     Breath sounds: No wheezing.  Musculoskeletal:     Cervical back: Neck supple. No tenderness.  Lymphadenopathy:     Cervical: No cervical adenopathy.  Skin:    Findings: No bruising, erythema or rash.       UC Treatments / Results  Labs (all labs ordered are listed, but only abnormal results are displayed) Labs Reviewed - No data to display  EKG   Radiology No results found.  Procedures Procedures (including critical care time)  Medications Ordered in UC Medications - No data to display  Initial Impression / Assessment and Plan / UC Course  I have reviewed the triage vital signs and the nursing notes.  Pertinent labs & imaging results that were available during my care of the patient were reviewed by me and considered in my medical decision making (see chart for details).     H&P concerning for bacterial conjunctivitis: We will treat as outlined below.  Return precautions discussed, patient verbalized understanding and is agreeable to plan. Final Clinical Impressions(s) / UC Diagnoses   Final diagnoses:  Irritation of right eye     Discharge Instructions     Use eyedrops as directed on eye(s) as prescribed.  May use artificial tear gel/drops at needed. Important to use artificial tear gel/drops last and wait 10-15 minutes between drops as it can prevent your prescription drops from working properly.  Important to follow up with Ophthalmology (eye doctor). Return sooner for worsening of symptoms, change in vision, sensitivity to light, eye swelling, painful eye movement, or fever.     ED Prescriptions    Medication Sig Dispense Auth. Provider   Polyethyl Glycol-Propyl Glycol (LUBRICANT EYE DROPS) 0.4-0.3 % SOLN Apply 1 drop to eye 3 (three) times daily as needed. 10 mL Hall-Potvin, [place], PA-C   trimethoprim-polymyxin b (POLYTRIM) ophthalmic solution Place 1 drop into the left eye every 4 (four) hours. 10 mL Hall-Potvin, [place], PA-C     PDMP not reviewed this encounter.   Hall-Potvin, [place], [place] 10/23/19 1249

## 2019-10-23 NOTE — ED Triage Notes (Signed)
Pt c/o rt eye irritation yesterday, woke up with redness and draining. States having lt eye irritation now. States has had pink eye 5 times, feels like the same.

## 2019-10-26 ENCOUNTER — Encounter: Payer: Self-pay | Admitting: Medical

## 2019-10-26 ENCOUNTER — Other Ambulatory Visit: Payer: Self-pay

## 2019-10-26 ENCOUNTER — Ambulatory Visit (INDEPENDENT_AMBULATORY_CARE_PROVIDER_SITE_OTHER): Payer: BC Managed Care – PPO | Admitting: Medical

## 2019-10-26 NOTE — Progress Notes (Signed)
Patient presents to office today for Nexplanon Insertion- reports unprotected intercourse 4 days ago. Discussed unable to place Nexplanon today due to potential for pregnancy. Patient will return in a few weeks for UPT and placement. Discussed if menses starts in the next few days, Raynelle Fanning will work into her schedule next week.

## 2019-10-26 NOTE — Progress Notes (Signed)
Based on CDC Contraception guidelines we could not be reasonably certain that patient wasn't pregnant today. She has been rescheduled and advised to call for sooner appointment if she starts her period before that visit.   Marny Lowenstein, PA-C 10/26/2019 9:13 AM

## 2019-10-29 DIAGNOSIS — L658 Other specified nonscarring hair loss: Secondary | ICD-10-CM

## 2019-11-09 ENCOUNTER — Other Ambulatory Visit: Payer: Self-pay

## 2019-11-09 ENCOUNTER — Ambulatory Visit (INDEPENDENT_AMBULATORY_CARE_PROVIDER_SITE_OTHER): Payer: BC Managed Care – PPO | Admitting: Family Medicine

## 2019-11-09 ENCOUNTER — Encounter: Payer: Self-pay | Admitting: Family Medicine

## 2019-11-09 VITALS — BP 136/80 | HR 72 | Temp 97.1°F | Ht 67.0 in | Wt 311.0 lb

## 2019-11-09 DIAGNOSIS — L732 Hidradenitis suppurativa: Secondary | ICD-10-CM | POA: Diagnosis not present

## 2019-11-09 DIAGNOSIS — Z30011 Encounter for initial prescription of contraceptive pills: Secondary | ICD-10-CM | POA: Diagnosis not present

## 2019-11-09 DIAGNOSIS — L659 Nonscarring hair loss, unspecified: Secondary | ICD-10-CM | POA: Diagnosis not present

## 2019-11-09 LAB — POCT URINE PREGNANCY: Preg Test, Ur: NEGATIVE

## 2019-11-09 MED ORDER — NORGESTIMATE-ETH ESTRADIOL 0.25-35 MG-MCG PO TABS: 1.0000 | ORAL_TABLET | Freq: Every day | ORAL | 3 refills | Status: DC

## 2019-11-09 NOTE — Progress Notes (Signed)
7/2/202111:26 AM  Alexandra Collins 1996-01-05, 24 y.o., female [id number]  Chief Complaint  Patient presents with  . New Patient (Initial Visit)    asking for birth control, and referral for derm in [place] due to hidradenitis and hair loss      HPI:   Patient is a 24 y.o. female who presents today to establish care   G1P1 SVD on July 26 2019, not breastfeeding Pregnancy complicated by post partum preeclampsia and epidural HA Has resumed menses  LMP October 29 2019 Has been sexually active, unprotected She would like to restart nexplanon Has done well on OCPs as well, previous migraines wo aura, no VTE, no liver disease  Requesting referral to derm in [place] [facility] about 2 years ago Used to see derm hidranetitis Has noticed hair loss for past couple of weeks   Depression screen [facility] 2/9 11/09/2019 10/26/2019 09/04/2019  Decreased Interest 0 0 0  Down, Depressed, Hopeless 0 0 0  PHQ - 2 Score 0 0 0  Altered sleeping - 0 0  Tired, decreased energy - 0 1  Change in appetite - 0 0  Feeling bad or failure about yourself  - 0 0  Trouble concentrating - 0 0  Moving slowly or fidgety/restless - 0 0  Suicidal thoughts - 0 0  PHQ-9 Score - 0 1  Some recent data might be hidden    Fall Risk  11/09/2019  Falls in the past year? 0  Number falls in past yr: 0  Injury with Fall? 0     Allergies  Allergen Reactions  . Ceclor [Cefaclor] Hives  . Garlic Hives    Only allergic to raw garlic  . Penicillins Hives    Has patient had a PCN reaction causing immediate rash, facial/tongue/throat swelling, SOB or lightheadedness with hypotension: Yes Has patient had a PCN reaction causing severe rash involving mucus membranes or skin necrosis: Yes Has patient had a PCN reaction that required hospitalization: No Has patient had a PCN reaction occurring within the last 10 years: No If all of the above answers are "NO", then may proceed with Cephalosporin use.     . Shellfish Allergy Itching  . Sulfamethoxazole Nausea And Vomiting  . Sulfur Hives  . Augmentin [Amoxicillin-Pot Clavulanate] Rash    Prior to Admission medications   Medication Sig Start Date End Date Taking? Authorizing Provider  Prenatal Vit-Fe Fumarate-FA (PRENATAL MULTIVITAMIN) TABS tablet Take 1 tablet by mouth daily at 12 noon.   Yes [provider]    Past Medical History:  Diagnosis Date  . Anemia   . Hydradenitis     Past Surgical History:  Procedure Laterality Date  . KNEE ARTHROSCOPY Right     Social History   Tobacco Use  . Smoking status: Never Smoker  . Smokeless tobacco: Never Used  Substance Use Topics  . Alcohol use: No    Family History  Problem Relation Age of Onset  . Hypertension Mother   . Seizures Father   . Hypertension Father     ROS Per hpi  OBJECTIVE:  Today's Vitals   11/09/19 1105  BP: 136/80  Pulse: 72  Temp: (!) 97.1 F (36.2 C)  SpO2: 100%  Weight: (!) 311 lb (141.1 kg)  Height: 5\' 7"  (1.702 m)   Body mass index is 48.71 kg/m.   Physical Exam Vitals and nursing note reviewed.  Constitutional:      Appearance: She is well-developed.  HENT:     Head: Normocephalic and atraumatic.     Mouth/Throat:     Pharynx: No oropharyngeal exudate.  Eyes:     General: No scleral icterus.    Conjunctiva/sclera: Conjunctivae normal.     Pupils: Pupils are equal, round, and reactive to light.  Cardiovascular:     Rate and Rhythm: Normal rate and regular rhythm.     Heart sounds: Normal heart sounds. No murmur heard.  No friction rub. No gallop.   Pulmonary:     Effort: Pulmonary effort is normal.     Breath sounds: Normal breath sounds. No wheezing or rales.  Musculoskeletal:     Cervical back: Neck supple.  Skin:    General: Skin is warm and dry.     Comments: Thinning of hair along frontal hairline, no scarring or scalp lesions noted  Neurological:     Mental Status: She is alert and oriented to person,  place, and time.     Results for orders placed or performed in visit on 11/09/19 (from the past 24 hour(s))  POCT urine pregnancy     Status: None   Collection Time: 11/09/19 11:15 AM  Result Value Ref Range   Preg Test, Ur Negative Negative    No results found.   ASSESSMENT and PLAN  1. Encounter for initial prescription of contraceptive pills Start OCPs as bridge to nexplanon. Discussed need to 7 days of back up method. Reviewed r/se/b. Patient educational handout given. - POCT urine pregnancy  2. Suppurative hidradenitis - Ambulatory referral to Dermatology  3. Hair loss Discussed most likely related to recent pregnancy - TSH - CBC - Ambulatory referral to Dermatology  Other orders - norgestimate-ethinyl estradiol (ORTHO-CYCLEN) 0.25-35 MG-MCG tablet; Take 1 tablet by mouth daily.  Return for we will call to schedule nexplanon insertion.    Myles Lipps, MD Primary Care at [facility] [address] [place], [place] [address] Ph.  [phone] Fax [phone]

## 2019-11-09 NOTE — Patient Instructions (Signed)
° ° ° °  If you have lab work done today you will be contacted with your lab results within the next 2 weeks.  If you have not heard from us then please contact us. The fastest way to get your results is to register for My Chart. ° ° °IF you received an x-ray today, you will receive an invoice from [facility]. Please contact [facility] at [phone] with questions or concerns regarding your invoice.  ° °IF you received labwork today, you will receive an invoice from [facility]. Please contact [facility] at [phone] with questions or concerns regarding your invoice.  ° °Our billing staff will not be able to assist you with questions regarding bills from these companies. ° °You will be contacted with the lab results as soon as they are available. The fastest way to get your results is to activate your My Chart account. Instructions are located on the last page of this paperwork. If you have not heard from us regarding the results in 2 weeks, please contact this office. °  ° ° ° °

## 2019-11-10 LAB — CBC
Hematocrit: 35.3 % (ref 34.0–46.6)
Hemoglobin: 11.7 g/dL (ref 11.1–15.9)
MCH: 27.8 pg (ref 26.6–33.0)
MCHC: 33.1 g/dL (ref 31.5–35.7)
MCV: 84 fL (ref 79–97)
Platelets: 452 10*3/uL — ABNORMAL HIGH (ref 150–450)
RBC: 4.21 x10E6/uL (ref 3.77–5.28)
RDW: 13.6 % (ref 11.7–15.4)
WBC: 3.4 10*3/uL (ref 3.4–10.8)

## 2019-11-10 LAB — TSH: TSH: 1.79 u[IU]/mL (ref 0.450–4.500)

## 2019-11-20 ENCOUNTER — Ambulatory Visit (INDEPENDENT_AMBULATORY_CARE_PROVIDER_SITE_OTHER): Payer: Medicaid Other | Admitting: Student

## 2019-11-20 ENCOUNTER — Other Ambulatory Visit: Payer: Self-pay

## 2019-11-20 ENCOUNTER — Encounter: Payer: Self-pay | Admitting: General Practice

## 2019-11-20 ENCOUNTER — Encounter: Payer: Self-pay | Admitting: Student

## 2019-11-20 VITALS — BP 131/72 | HR 82 | Ht 67.0 in | Wt 310.3 lb

## 2019-11-20 DIAGNOSIS — Z30017 Encounter for initial prescription of implantable subdermal contraceptive: Secondary | ICD-10-CM

## 2019-11-20 DIAGNOSIS — Z3202 Encounter for pregnancy test, result negative: Secondary | ICD-10-CM

## 2019-11-20 LAB — POCT PREGNANCY, URINE: Preg Test, Ur: NEGATIVE

## 2019-11-20 MED ORDER — ETONOGESTREL 68 MG ~~LOC~~ IMPL
68.0000 mg | DRUG_IMPLANT | Freq: Once | SUBCUTANEOUS | Status: AC
  Administered 2019-11-20: 68 mg via SUBCUTANEOUS

## 2019-11-20 NOTE — Progress Notes (Signed)
  History:  [name] is a 24 y.o. G1P1001 who presents to clinic today for Nexplanon placement.   The following portions of the patient's history were reviewed and updated as appropriate: allergies, current medications, family history, past medical history, social history, past surgical history and problem list.  Review of Systems:  Review of Systems  Constitutional: Negative.   HENT: Negative.   Respiratory: Negative.   Cardiovascular: Negative.   Genitourinary: Negative.   Musculoskeletal: Negative.   Skin: Negative.   Neurological: Negative.   Psychiatric/Behavioral: Negative.       Objective:  Physical Exam BP 131/72   Pulse 82   Ht 5\' 7"  (1.702 m)   Wt (!) 310 lb 4.8 oz (140.8 kg)   LMP 11/03/2019 (Approximate)   BMI 48.60 kg/m  Physical Exam Constitutional:      Appearance: Normal appearance.  HENT:     Head: Normocephalic.  Eyes:     Pupils: Pupils are equal, round, and reactive to light.  Cardiovascular:     Pulses: Normal pulses.  Skin:    General: Skin is warm.  Neurological:     General: No focal deficit present.     Mental Status: She is alert.       Labs and Imaging Results for orders placed or performed in visit on 11/20/19 (from the past 24 hour(s))  Pregnancy, urine POC     Status: None   Collection Time: 11/20/19 10:47 AM  Result Value Ref Range   Preg Test, Ur NEGATIVE NEGATIVE    No results found.   Assessment & Plan:   1. Nexplanon insertion    -UPT negative today   NEXPLANON INSERTION: Appropriate time out taken. Nexlanon site (right arm) identified and the area was prepped in usual sterile fashon. 2 cc of 1% lidocaine was used to anesthetize the area starting with the distal end.   Next, the area was cleansed with betadine and the Nexplanon was inserted without difficulty.  Pressure bandage was applied.  Pt was instructed to remove pressure bandage in a few hours, and keep insertion site covered with a bandaid  for 3 days.    Approximately 30 minutes of face-to-face time was spent with this patient   11/22/19, CNM 11/20/2019 11:51 AM

## 2019-11-23 ENCOUNTER — Telehealth: Payer: Self-pay | Admitting: Emergency Medicine

## 2019-11-23 NOTE — Telephone Encounter (Signed)
Called patient to let her know her Nexplanon was here. Patient stated she already received this from her [facility].

## 2019-12-09 DIAGNOSIS — Z419 Encounter for procedure for purposes other than remedying health state, unspecified: Secondary | ICD-10-CM | POA: Diagnosis not present

## 2019-12-20 ENCOUNTER — Ambulatory Visit
Admission: EM | Admit: 2019-12-20 | Discharge: 2019-12-20 | Disposition: A | Discharge status: Home / Self Care | Payer: Medicaid Other | Attending: Physician Assistant | Admitting: Physician Assistant

## 2019-12-20 ENCOUNTER — Other Ambulatory Visit: Payer: Self-pay

## 2019-12-20 DIAGNOSIS — Z1152 Encounter for screening for COVID-19: Secondary | ICD-10-CM | POA: Diagnosis not present

## 2019-12-20 DIAGNOSIS — R05 Cough: Secondary | ICD-10-CM | POA: Insufficient documentation

## 2019-12-20 DIAGNOSIS — J3489 Other specified disorders of nose and nasal sinuses: Secondary | ICD-10-CM | POA: Diagnosis not present

## 2019-12-20 DIAGNOSIS — R059 Cough, unspecified: Secondary | ICD-10-CM

## 2019-12-20 DIAGNOSIS — J029 Acute pharyngitis, unspecified: Secondary | ICD-10-CM | POA: Diagnosis not present

## 2019-12-20 LAB — POCT RAPID STREP A (OFFICE): Rapid Strep A Screen: NEGATIVE

## 2019-12-20 MED ORDER — FLUTICASONE PROPIONATE 50 MCG/ACT NA SUSP: 2.0000 | Freq: Every day | NASAL | 0 refills | Status: DC

## 2019-12-20 MED ORDER — BENZONATATE 200 MG PO CAPS: 200.0000 mg | ORAL_CAPSULE | Freq: Three times a day (TID) | ORAL | 0 refills | Status: DC

## 2019-12-20 MED ORDER — LIDOCAINE VISCOUS HCL 2 % MT SOLN: OROMUCOSAL | 0 refills | Status: DC

## 2019-12-20 NOTE — ED Triage Notes (Signed)
Pt c/o dry cough, fatigue, head pressure, and sore throat since yesterday. States hx of strep and this feels like the start.

## 2019-12-20 NOTE — Discharge Instructions (Addendum)
Rapid strep negative. Symptoms are most likely due to viral illness/ drainage down your throat.  Covid testing ordered, please quarantine until testing results return.  Start lidocaine for sore throat, do not eat or drink for the next 40 mins after use as it can stunt your gag reflex. Tessalon for cough. Flonase for sinus pressure. You can use over the counter nasal saline rinse such as neti pot for nasal congestion. Monitor for any worsening of symptoms, swelling of the throat, trouble breathing, trouble swallowing, leaning forward to breath, drooling, go to the emergency department for further evaluation needed.  For sore throat/cough try using a honey-based tea. Use 3 teaspoons of honey with juice squeezed from half lemon. Place shaved pieces of ginger into 1/2-1 cup of water and warm over stove top. Then mix the ingredients and repeat every 4 hours as needed.

## 2019-12-20 NOTE — ED Provider Notes (Signed)
[facility]    CSN: [id number] Arrival date & time: 12/20/19  1325      History   Chief Complaint Chief Complaint  Patient presents with  . Sore Throat    HPI Alexandra Collins is a 24 y.o. female.   24 year old female comes in for 2 day of URI symptoms. Cough, sore throat, sinus pressure, fatigue, body aches. Denies fever, chills. Denies abdominal pain, nausea, vomiting, diarrhea. Denies loss of taste/smell. Had felt shortness of breath when symptoms first started, resolved on own. Never smoker.      Past Medical History:  Diagnosis Date  . Anemia   . Hydradenitis     Patient Active Problem List   Diagnosis Date Noted  . History of gestational hypertension 08/24/2019  . History of pre-eclampsia 08/24/2019  . Foot pain 03/05/2013  . Acne 02/26/2013  . 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 KNEE ARTHROSCOPY Right     OB History    Gravida  1   Para  1   Term  1   Preterm      AB      Living  1     SAB      TAB      Ectopic      Multiple  0   Live Births  1            Home Medications    Prior to Admission medications   Medication Sig Start Date End Date Taking? Authorizing Provider  benzonatate (TESSALON) 200 MG capsule Take 1 capsule (200 mg total) by mouth every 8 (eight) hours. 12/20/19   Cathie Hoops, Lai Hendriks V, PA-C  fluticasone (FLONASE) 50 MCG/ACT nasal spray Place 2 sprays into both nostrils daily. 12/20/19   Cathie Hoops, Briana Newman V, PA-C  lidocaine (XYLOCAINE) 2 % solution 5-15 mL gurgle as needed 12/20/19   Cathie Hoops, Brynja Marker V, PA-C  Prenatal Vit-Fe Fumarate-FA (PRENATAL MULTIVITAMIN) TABS tablet Take 1 tablet by mouth daily at 12 noon.    [provider]    Family History Family History  Problem Relation Age of Onset  . Hypertension Mother   . Seizures Father   . Hypertension Father     Social History Social History   Tobacco Use  . Smoking status: Never Smoker  . Smokeless  tobacco: Never Used  Vaping Use  . Vaping Use: Never used  Substance Use Topics  . Alcohol use: No  . Drug use: No     Allergies   Ceclor [cefaclor], Garlic, Penicillins, Shellfish allergy, Sulfamethoxazole, Sulfur, and Augmentin [amoxicillin-pot clavulanate]   Review of Systems Review of Systems  Reason unable to perform ROS: See HPI as above.     Physical Exam Triage Vital Signs ED Triage Vitals  Enc Vitals Group     BP 12/20/19 1424 (!) 147/77     Pulse Rate 12/20/19 1424 89     Resp 12/20/19 1424 18     Temp 12/20/19 1424 99.1 F (37.3 C)     Temp Source 12/20/19 1424 Oral     SpO2 12/20/19 1424 95 %     Weight --      Height --      Head Circumference --      Peak Flow --      Pain Score 12/20/19 1425 4     Pain Loc --      Pain Edu? --      Excl.  in GC? --    No data found.  Updated Vital Signs BP (!) 147/77 (BP Location: Left Arm)   Pulse 89   Temp 99.1 F (37.3 C) (Oral)   Resp 18   LMP 12/01/2019   SpO2 95%   Breastfeeding No   Physical Exam Constitutional:      General: She is not in acute distress.    Appearance: Normal appearance. She is well-developed. She is not ill-appearing, toxic-appearing or diaphoretic.  HENT:     Head: Normocephalic and atraumatic.     Right Ear: Tympanic membrane, ear canal and external ear normal. Tympanic membrane is not erythematous or bulging.     Left Ear: Tympanic membrane, ear canal and external ear normal. Tympanic membrane is not erythematous or bulging.     Nose:     Right Sinus: Frontal sinus tenderness present. No maxillary sinus tenderness.     Left Sinus: Frontal sinus tenderness present. No maxillary sinus tenderness.     Mouth/Throat:     Mouth: Mucous membranes are moist.     Pharynx: Oropharynx is clear. Uvula midline.     Tonsils: No tonsillar exudate. 1+ on the right. 1+ on the left.  Eyes:     Conjunctiva/sclera: Conjunctivae normal.     Pupils: Pupils are equal, round, and reactive to  light.  Cardiovascular:     Rate and Rhythm: Normal rate and regular rhythm.  Pulmonary:     Effort: Pulmonary effort is normal. No accessory muscle usage, prolonged expiration, respiratory distress or retractions.     Breath sounds: No decreased air movement or transmitted upper airway sounds. No decreased breath sounds.     Comments: LCTAB Musculoskeletal:     Cervical back: Normal range of motion and neck supple.  Skin:    General: Skin is warm and dry.  Neurological:     Mental Status: She is alert and oriented to person, place, and time.      UC Treatments / Results  Labs (all labs ordered are listed, but only abnormal results are displayed) Labs Reviewed  POCT RAPID STREP A (OFFICE) - Normal  NOVEL CORONAVIRUS, NAA  CULTURE, GROUP A STREP [facility])    EKG   Radiology No results found.  Procedures Procedures (including critical care time)  Medications Ordered in UC Medications - No data to display  Initial Impression / Assessment and Plan / UC Course  I have reviewed the triage vital signs and the nursing notes.  Pertinent labs & imaging results that were available during my care of the patient were reviewed by me and considered in my medical decision making (see chart for details).    COVID PCR test ordered. Patient to quarantine until testing results return. No alarming signs on exam. Nontoxic in appearance. LCTAB Symptomatic treatment discussed.  Push fluids.  Return precautions given.  Patient expresses understanding and agrees to plan.   Final Clinical Impressions(s) / UC Diagnoses   Final diagnoses:  Encounter for screening for COVID-19  Cough  Sinus pressure  Sore throat    ED Prescriptions    Medication Sig Dispense Auth. Provider   fluticasone (FLONASE) 50 MCG/ACT nasal spray Place 2 sprays into both nostrils daily. 1 g Darleny Sem V, PA-C   benzonatate (TESSALON) 200 MG capsule Take 1 capsule (200 mg total) by mouth every 8 (eight) hours. 21 capsule  Fauna Neuner V, PA-C   lidocaine (XYLOCAINE) 2 % solution 5-15 mL gurgle as needed 150 mL Cathie Hoops, Gaurav Baldree V, PA-C  PDMP not reviewed this encounter.   Belinda Fisher, PA-C 12/20/19 1454

## 2019-12-21 ENCOUNTER — Ambulatory Visit
Admission: EM | Admit: 2019-12-21 | Discharge: 2019-12-21 | Disposition: A | Discharge status: Home / Self Care | Payer: Medicaid Other | Attending: Emergency Medicine | Admitting: Emergency Medicine

## 2019-12-21 ENCOUNTER — Other Ambulatory Visit: Payer: Self-pay

## 2019-12-21 ENCOUNTER — Encounter: Payer: Self-pay | Admitting: Family Medicine

## 2019-12-21 DIAGNOSIS — R002 Palpitations: Secondary | ICD-10-CM | POA: Diagnosis not present

## 2019-12-21 LAB — SARS-COV-2, NAA 2 DAY TAT

## 2019-12-21 LAB — NOVEL CORONAVIRUS, NAA: SARS-CoV-2, NAA: NOT DETECTED

## 2019-12-21 NOTE — ED Provider Notes (Signed)
[facility]    CSN: [id number] Arrival date & time: 12/21/19  1529      History   Chief Complaint Chief Complaint  Patient presents with  . Palpitations    HPI Alexandra Collins is a 24 y.o. female presenting for palpitations since this morning.  Patient states that she has had a history of this, though not as frequent.  Patient seen yesterday for URI symptoms: Records reviewed by me at time of visit.  Rapid strep negative, Covid pending.  Denies lightheadedness, dizziness, visual or auditory changes, tinnitus.  No shortness of breath, nausea or vomiting.  Has not tried thing for this.  Denies change in diet, lifestyle.  Has minimal caffeine intake which is stable.  Past Medical History:  Diagnosis Date  . Anemia   . Hydradenitis     Patient Active Problem List   Diagnosis Date Noted  . History of gestational hypertension 08/24/2019  . History of pre-eclampsia 08/24/2019  . Foot pain 03/05/2013  . Acne 02/26/2013  . 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 KNEE ARTHROSCOPY Right     OB History    Gravida  1   Para  1   Term  1   Preterm      AB      Living  1     SAB      TAB      Ectopic      Multiple  0   Live Births  1            Home Medications    Prior to Admission medications   Medication Sig Start Date End Date Taking? Authorizing Provider  benzonatate (TESSALON) 200 MG capsule Take 1 capsule (200 mg total) by mouth every 8 (eight) hours. 12/20/19   Cathie Hoops, Amy V, PA-C  fluticasone (FLONASE) 50 MCG/ACT nasal spray Place 2 sprays into both nostrils daily. 12/20/19   Cathie Hoops, Amy V, PA-C  lidocaine (XYLOCAINE) 2 % solution 5-15 mL gurgle as needed 12/20/19   Cathie Hoops, Amy V, PA-C  Prenatal Vit-Fe Fumarate-FA (PRENATAL MULTIVITAMIN) TABS tablet Take 1 tablet by mouth daily at 12 noon.    [provider]    Family History Family History  Problem Relation Age of Onset  .  Hypertension Mother   . Seizures Father   . Hypertension Father     Social History Social History   Tobacco Use  . Smoking status: Never Smoker  . Smokeless tobacco: Never Used  Vaping Use  . Vaping Use: Never used  Substance Use Topics  . Alcohol use: No  . Drug use: No     Allergies   Ceclor [cefaclor], Garlic, Penicillins, Shellfish allergy, Sulfamethoxazole, Sulfur, and Augmentin [amoxicillin-pot clavulanate]   Review of Systems As per HPI   Physical Exam Triage Vital Signs ED Triage Vitals  Enc Vitals Group     BP 12/21/19 1535 (!) 142/94     Pulse Rate 12/21/19 1535 87     Resp 12/21/19 1535 16     Temp 12/21/19 1535 98.8 F (37.1 C)     Temp src --      SpO2 12/21/19 1535 97 %     Weight --      Height --      Head Circumference --      Peak Flow --      Pain Score 12/21/19 1536 0     Pain Loc --  Pain Edu? --      Excl. in GC? --    No data found.  Updated Vital Signs BP (!) 142/94   Pulse 87   Temp 98.8 F (37.1 C)   Resp 16   LMP 12/01/2019   SpO2 97%   Visual Acuity Right Eye Distance:   Left Eye Distance:   Bilateral Distance:    Right Eye Near:   Left Eye Near:    Bilateral Near:     Physical Exam Constitutional:      General: She is not in acute distress.    Appearance: She is obese. She is not ill-appearing or diaphoretic.  HENT:     Head: Normocephalic and atraumatic.  Eyes:     General: No scleral icterus.    Pupils: Pupils are equal, round, and reactive to light.  Neck:     Comments: Trachea midline, negative JVD Cardiovascular:     Rate and Rhythm: Normal rate and regular rhythm.     Pulses: Normal pulses.     Heart sounds: No murmur heard.  No gallop.   Pulmonary:     Effort: Pulmonary effort is normal. No respiratory distress.     Breath sounds: No wheezing or rales.  Skin:    Capillary Refill: Capillary refill takes less than 2 seconds.     Coloration: Skin is not jaundiced or pale.  Neurological:      General: No focal deficit present.     Mental Status: She is alert and oriented to person, place, and time.      UC Treatments / Results  Labs (all labs ordered are listed, but only abnormal results are displayed) Labs Reviewed - No data to display  EKG   Radiology No results found.  Procedures Procedures (including critical care time)  Medications Ordered in UC Medications - No data to display  Initial Impression / Assessment and Plan / UC Course  I have reviewed the triage vital signs and the nursing notes.  Pertinent labs & imaging results that were available during my care of the patient were reviewed by me and considered in my medical decision making (see chart for details).     Patient febrile, nontoxic, and hemodynamically stable in office.  EKG done in office, reviewed by me compared to previous from March 2021: NSR with ventricular rate 92 bpm.  No QTC prolongation, ST elevation or depression.  Waveforms stable in all leads: Nonacute.  Provided patient education packet on palpitations and discussed monitoring symptoms and following up with PCP.  Patient will continue quarantining as Covid test is pending.  Return precautions discussed, pt verbalized understanding and is agreeable to plan. Final Clinical Impressions(s) / UC Diagnoses   Final diagnoses:  Palpitations     Discharge Instructions     Covid test pending: Check MyChart results online. Important follow-up with PCP for persistent symptoms. Recommend you keep a log of your symptoms until this follow-up. Avoid caffeine as this can trigger palpitations-please read attached handout. Go to ER for worsening palpitations, lightheadedness, dizziness, chest pain, or shortness of breath.    ED Prescriptions    None     PDMP not reviewed this encounter.   [place], [place], [place] 12/21/19 1639

## 2019-12-21 NOTE — ED Triage Notes (Signed)
Pt c/o intermittent palpitations since this AM

## 2019-12-21 NOTE — Discharge Instructions (Signed)
Covid test pending: Check MyChart results online. Important follow-up with PCP for persistent symptoms. Recommend you keep a log of your symptoms until this follow-up. Avoid caffeine as this can trigger palpitations-please read attached handout. Go to ER for worsening palpitations, lightheadedness, dizziness, chest pain, or shortness of breath.

## 2019-12-21 NOTE — Telephone Encounter (Signed)
Spoke with pt whom did go to the urgent care today.  Will follow up with pcp. Covid test is neg.

## 2019-12-22 LAB — CULTURE, GROUP A STREP (THRC)

## 2020-01-04 ENCOUNTER — Encounter: Payer: Self-pay | Admitting: Emergency Medicine

## 2020-01-04 ENCOUNTER — Other Ambulatory Visit: Payer: Self-pay

## 2020-01-04 ENCOUNTER — Ambulatory Visit
Admission: EM | Admit: 2020-01-04 | Discharge: 2020-01-04 | Disposition: A | Discharge status: Home / Self Care | Payer: Medicaid Other | Attending: Emergency Medicine | Admitting: Emergency Medicine

## 2020-01-04 DIAGNOSIS — Z1152 Encounter for screening for COVID-19: Secondary | ICD-10-CM | POA: Insufficient documentation

## 2020-01-04 DIAGNOSIS — J029 Acute pharyngitis, unspecified: Secondary | ICD-10-CM | POA: Diagnosis not present

## 2020-01-04 DIAGNOSIS — M5441 Lumbago with sciatica, right side: Secondary | ICD-10-CM | POA: Diagnosis not present

## 2020-01-04 DIAGNOSIS — L732 Hidradenitis suppurativa: Secondary | ICD-10-CM | POA: Diagnosis not present

## 2020-01-04 LAB — POCT RAPID STREP A (OFFICE): Rapid Strep A Screen: NEGATIVE

## 2020-01-04 MED ORDER — FLUTICASONE PROPIONATE 50 MCG/ACT NA SUSP: 1.0000 | Freq: Every day | NASAL | 0 refills | Status: DC

## 2020-01-04 MED ORDER — BENZONATATE 100 MG PO CAPS: 100.0000 mg | ORAL_CAPSULE | Freq: Three times a day (TID) | ORAL | 0 refills | Status: DC

## 2020-01-04 MED ORDER — CETIRIZINE HCL 10 MG PO TABS: 10.0000 mg | ORAL_TABLET | Freq: Every day | ORAL | 0 refills | Status: DC

## 2020-01-04 MED ORDER — DOXYCYCLINE HYCLATE 100 MG PO CAPS: 100.0000 mg | ORAL_CAPSULE | Freq: Two times a day (BID) | ORAL | 0 refills | Status: AC

## 2020-01-04 MED ORDER — NAPROXEN 500 MG PO TABS: 500.0000 mg | ORAL_TABLET | Freq: Two times a day (BID) | ORAL | 0 refills | Status: DC

## 2020-01-04 MED ORDER — CYCLOBENZAPRINE HCL 5 MG PO TABS: 5.0000 mg | ORAL_TABLET | Freq: Two times a day (BID) | ORAL | 0 refills | Status: AC | PRN

## 2020-01-04 NOTE — Discharge Instructions (Signed)

## 2020-01-04 NOTE — ED Triage Notes (Signed)
Pt here for sore throat, lower back pain and also a wound to right side that is red; pt sts hx of HS; pt sts works in daycare

## 2020-01-04 NOTE — ED Provider Notes (Signed)
[facility]    CSN: [id number] Arrival date & time: 01/04/20  1539      History   Chief Complaint Chief Complaint  Patient presents with  . Sore Throat  . Back Pain    HPI Alexandra Collins is a 24 y.o. female with history of obesity, hidradenitis presenting for multiple concerns. Patient noting lumbar strain with right-sided sciatica since Monday.  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  Denies saddle anesthesia, urinary retention or fecal incontinence, fever.  No lower extremity numbness or weakness.  Has not seen them for this. Patient also notes sore throat since yesterday.  Mild, dry cough.  No nasal congestion, chest pain, shortness of breath.  States is there was a child at school the other day who "had a fever ".  No known Covid contacts. Patient also notes HS flare on right side.  No discharge, surrounding erythema.  Has not been on antibiotics "a long time "for this.  States she still looking for dermatologic care.  Past Medical History:  Diagnosis Date  . Anemia   . Hydradenitis     Patient Active Problem List   Diagnosis Date Noted  . History of gestational hypertension 08/24/2019  . History of pre-eclampsia 08/24/2019  . Foot pain 03/05/2013  . Acne 02/26/2013  . 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 KNEE ARTHROSCOPY Right     OB History    Gravida  1   Para  1   Term  1   Preterm      AB      Living  1     SAB      TAB      Ectopic      Multiple  0   Live Births  1            Home Medications    Prior to Admission medications   Medication Sig Start Date End Date Taking? Authorizing Provider  benzonatate (TESSALON) 100 MG capsule Take 1 capsule (100 mg total) by mouth every 8 (eight) hours. 01/04/20   Hall-Potvin, [place], PA-C  cetirizine (ZYRTEC ALLERGY) 10 MG tablet Take 1  tablet (10 mg total) by mouth daily. 01/04/20   Hall-Potvin, [place], PA-C  cyclobenzaprine (FLEXERIL) 5 MG tablet Take 1 tablet (5 mg total) by mouth 2 (two) times daily as needed for up to 7 days for muscle spasms. 01/04/20 01/11/20  Hall-Potvin, [place], PA-C  doxycycline (VIBRAMYCIN) 100 MG capsule Take 1 capsule (100 mg total) by mouth 2 (two) times daily for 5 days. 01/04/20 01/09/20  Hall-Potvin, [place], PA-C  fluticasone (FLONASE) 50 MCG/ACT nasal spray Place 1 spray into both nostrils daily. 01/04/20   Hall-Potvin, [place], PA-C  naproxen (NAPROSYN) 500 MG tablet Take 1 tablet (500 mg total) by mouth 2 (two) times daily. 01/04/20   Hall-Potvin, [place], PA-C  Prenatal Vit-Fe Fumarate-FA (PRENATAL MULTIVITAMIN) TABS tablet Take 1 tablet by mouth daily at 12 noon.    [provider]    Family History Family History  Problem Relation Age of Onset  . Hypertension Mother   . Seizures Father   . Hypertension Father     Social History Social History   Tobacco Use  . Smoking status: Never Smoker  . Smokeless tobacco: Never Used  Vaping Use  . Vaping Use: Never used  Substance  Use Topics  . Alcohol use: No  . Drug use: No     Allergies   Ceclor [cefaclor], Garlic, Penicillins, Shellfish allergy, Sulfamethoxazole, Sulfur, and Augmentin [amoxicillin-pot clavulanate]   Review of Systems As per HPI   Physical Exam Triage Vital Signs ED Triage Vitals  Enc Vitals Group     BP      Pulse      Resp      Temp      Temp src      SpO2      Weight      Height      Head Circumference      Peak Flow      Pain Score      Pain Loc      Pain Edu?      Excl. in GC?    No data found.  Updated Vital Signs BP 128/79 (BP Location: Left Arm)   Pulse 81   Temp 98.9 F (37.2 C) (Oral)   Resp 18   SpO2 98%   Visual Acuity Right Eye Distance:   Left Eye Distance:   Bilateral Distance:    Right Eye Near:   Left Eye Near:    Bilateral Near:     Physical  Exam Constitutional:      General: She is not in acute distress.    Appearance: She is well-developed. She is obese. She is not ill-appearing.  HENT:     Head: Normocephalic and atraumatic.     Right Ear: Tympanic membrane and ear canal normal.     Left Ear: Tympanic membrane and ear canal normal.     Mouth/Throat:     Mouth: Mucous membranes are moist.     Pharynx: Uvula midline. No posterior oropharyngeal erythema or uvula swelling.     Tonsils: No tonsillar exudate. 1+ on the right. 1+ on the left.  Eyes:     General: No scleral icterus.    Conjunctiva/sclera: Conjunctivae normal.     Pupils: Pupils are equal, round, and reactive to light.  Cardiovascular:     Rate and Rhythm: Normal rate and regular rhythm.  Pulmonary:     Effort: Pulmonary effort is normal. No respiratory distress.     Breath sounds: No wheezing.  Musculoskeletal:        General: Tenderness present. No swelling. Normal range of motion.     Cervical back: Normal range of motion and neck supple.     Comments: Diffuse right-sided lumbar tenderness without PSIS or spinous process tenderness.  Positive SLR right.  NVI  Lymphadenopathy:     Cervical: No cervical adenopathy.  Skin:    Capillary Refill: Capillary refill takes less than 2 seconds.     Coloration: Skin is not jaundiced or pale.     Comments: Scattered nodular lesions on right abdomen.  No surrounding erythema, warmth.  Patient does have focal lesion tenderness consistent with HS  Neurological:     General: No focal deficit present.     Mental Status: She is alert and oriented to person, place, and time.      UC Treatments / Results  Labs (all labs ordered are listed, but only abnormal results are displayed) Labs Reviewed  NOVEL CORONAVIRUS, NAA  CULTURE, GROUP A STREP [facility])  POCT RAPID STREP A (OFFICE)    EKG   Radiology No results found.  Procedures Procedures (including critical care time)  Medications Ordered in UC Medications  - No data to display  Initial  Impression / Assessment and Plan / UC Course  I have reviewed the triage vital signs and the nursing notes.  Pertinent labs & imaging results that were available during my care of the patient were reviewed by me and considered in my medical decision making (see chart for details).     HS without signs/symptoms of secondary cellulitis.  Does have extensive lesions over right side.  Will start doxycycline which she tolerated well in the past, have her follow-up with dermatology. Sore throat: Rapid strep negative, culture pending.  Covid PCR pending: We will quarantine in the interim.  Treat supportively as outlined below. Patient with lumbago and right sciatica.  No alarm symptoms as mentioned in HPI: Treat supportively as outlined below.  Return precautions discussed, pt verbalized understanding and is agreeable to plan. Final Clinical Impressions(s) / UC Diagnoses   Final diagnoses:  Encounter for screening for COVID-19  Acute right-sided low back pain with right-sided sciatica  Sore throat  Suppurative hidradenitis     Discharge Instructions     Your rapid strep test was negative today.  The culture is pending.  Please look on your MyChart for test results.   We will notify you if the culture positive and outline a treatment plan at that time.   Please continue Tylenol and/or Ibuprofen as needed for fever, pain.  May try warm salt water gargles, cepacol lozenges, throat spray, warm tea or water with lemon/honey, or OTC cold relief medicine for throat discomfort.   For congestion: take a daily anti-histamine like Zyrtec, Claritin, and a oral decongestant to help with post nasal drip that may be irritating your throat.   It is important to stay hydrated: drink plenty of fluids (primarily water) to keep your throat moisturized and help further relieve irritation/discomfort.     ED Prescriptions    Medication Sig Dispense Auth. Provider   doxycycline  (VIBRAMYCIN) 100 MG capsule Take 1 capsule (100 mg total) by mouth 2 (two) times daily for 5 days. 10 capsule Hall-Potvin, [place], PA-C   cetirizine (ZYRTEC ALLERGY) 10 MG tablet Take 1 tablet (10 mg total) by mouth daily. 30 tablet Hall-Potvin, [place], PA-C   fluticasone (FLONASE) 50 MCG/ACT nasal spray Place 1 spray into both nostrils daily. 16 g Hall-Potvin, [place], PA-C   benzonatate (TESSALON) 100 MG capsule Take 1 capsule (100 mg total) by mouth every 8 (eight) hours. 21 capsule Hall-Potvin, [place], PA-C   naproxen (NAPROSYN) 500 MG tablet Take 1 tablet (500 mg total) by mouth 2 (two) times daily. 30 tablet Hall-Potvin, [place], PA-C   cyclobenzaprine (FLEXERIL) 5 MG tablet Take 1 tablet (5 mg total) by mouth 2 (two) times daily as needed for up to 7 days for muscle spasms. 14 tablet Hall-Potvin, [place], PA-C     PDMP not reviewed this encounter.   Hall-Potvin, [place], [place] 01/04/20 1716

## 2020-01-06 LAB — NOVEL CORONAVIRUS, NAA: SARS-CoV-2, NAA: NOT DETECTED

## 2020-01-06 LAB — SARS-COV-2, NAA 2 DAY TAT

## 2020-01-09 DIAGNOSIS — Z419 Encounter for procedure for purposes other than remedying health state, unspecified: Secondary | ICD-10-CM | POA: Diagnosis not present

## 2020-01-10 LAB — CULTURE, GROUP A STREP (THRC)

## 2020-01-17 ENCOUNTER — Ambulatory Visit: Payer: Medicaid Other | Admitting: Family Medicine

## 2020-01-31 ENCOUNTER — Other Ambulatory Visit: Payer: Self-pay | Admitting: Family Medicine

## 2020-02-04 ENCOUNTER — Ambulatory Visit
Admission: EM | Admit: 2020-02-04 | Discharge: 2020-02-04 | Disposition: A | Discharge status: Home / Self Care | Payer: Medicaid Other | Attending: Physician Assistant | Admitting: Physician Assistant

## 2020-02-04 ENCOUNTER — Other Ambulatory Visit: Payer: Self-pay

## 2020-02-04 DIAGNOSIS — Z1152 Encounter for screening for COVID-19: Secondary | ICD-10-CM | POA: Diagnosis not present

## 2020-02-04 DIAGNOSIS — J209 Acute bronchitis, unspecified: Secondary | ICD-10-CM

## 2020-02-04 MED ORDER — PREDNISONE 50 MG PO TABS: ORAL_TABLET | ORAL | 0 refills | Status: DC

## 2020-02-04 MED ORDER — AZITHROMYCIN 250 MG PO TABS: 250.0000 mg | ORAL_TABLET | Freq: Every day | ORAL | 0 refills | Status: DC

## 2020-02-04 MED ORDER — BENZONATATE 100 MG PO CAPS: 100.0000 mg | ORAL_CAPSULE | Freq: Three times a day (TID) | ORAL | 0 refills | Status: DC | PRN

## 2020-02-04 MED ORDER — MELOXICAM 7.5 MG PO TABS: 7.5000 mg | ORAL_TABLET | Freq: Two times a day (BID) | ORAL | 1 refills | Status: DC

## 2020-02-04 MED ORDER — BENZONATATE 100 MG PO CAPS: 100.0000 mg | ORAL_CAPSULE | Freq: Three times a day (TID) | ORAL | 0 refills | Status: AC | PRN

## 2020-02-04 MED ORDER — MELOXICAM 7.5 MG PO TABS: 7.5000 mg | ORAL_TABLET | Freq: Two times a day (BID) | ORAL | 1 refills | Status: AC

## 2020-02-04 NOTE — Discharge Instructions (Signed)
Take 2 tablets of azithromycin the first day.  Take 1 tablet daily for the next 4 days. Take prednisone, 1 tablet in the morning once a day for 5 days. You can take Tessalon Perles at night before bed.

## 2020-02-04 NOTE — ED Triage Notes (Signed)
Pt c/o cough, nasal congestion, headache, and back pain since 08/12. States some days are better then other but never went away.

## 2020-02-04 NOTE — ED Provider Notes (Signed)
Emergency Department Provider Note  ____________________________________________  Time seen: Approximately 10:12 AM  I have reviewed the triage vital signs and the nursing notes.   HISTORY  Chief Complaint Cough   Historian Patient     HPI Alexandra Collins is a 24 y.o. female presents to the emergency department with productive cough for green sputum production.  Patient states that she developed a viral URI in late August.  She states that her symptoms improved some but her cough has never gone away.  She states that cough is keeping her up at night.  She denies shortness of breath and fever.  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  She denies chest pain and chest tightness.  She endorses some upper back pain that is worse with cough.  No sick contacts in her home.  No other alleviating measures have been attempted.   Past Medical History:  Diagnosis Date  . Anemia   . Hydradenitis      Immunizations up to date:  Yes.     Past Medical History:  Diagnosis Date  . Anemia   . Hydradenitis     Patient Active Problem List   Diagnosis Date Noted  . History of gestational hypertension 08/24/2019  . History of pre-eclampsia 08/24/2019  . Foot pain 03/05/2013  . Acne 02/26/2013  . 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 KNEE ARTHROSCOPY Right     Prior to Admission medications   Medication Sig Start Date End Date Taking? Authorizing Provider  azithromycin (ZITHROMAX) 250 MG tablet Take 1 tablet (250 mg total) by mouth daily. Take first 2 tablets together, then 1 every day until finished. 02/04/20   Orvil Feil, PA-C  benzonatate (TESSALON PERLES) 100 MG capsule Take 1 capsule (100 mg total) by mouth 3 (three) times daily as needed for up to 7 days for cough. 02/04/20 02/11/20  Orvil Feil, PA-C  benzonatate (TESSALON) 100 MG capsule Take 1 capsule (100 mg total) by mouth 3 (three) times daily as  needed for up to 7 days for cough. 02/04/20 02/11/20  Orvil Feil, PA-C  cetirizine (ZYRTEC ALLERGY) 10 MG tablet Take 1 tablet (10 mg total) by mouth daily. 01/04/20   Hall-Potvin, [place], PA-C  fluticasone (FLONASE) 50 MCG/ACT nasal spray Place 1 spray into both nostrils daily. 01/04/20   Hall-Potvin, [place], PA-C  predniSONE (DELTASONE) 50 MG tablet Take one tablet once daily for the next five days. 02/04/20   Orvil Feil, PA-C    Allergies Ceclor [cefaclor], Garlic, Penicillins, Shellfish allergy, Sulfamethoxazole, Sulfur, and Augmentin [amoxicillin-pot clavulanate]  Family History  Problem Relation Age of Onset  . Hypertension Mother   . Seizures Father   . Hypertension Father     Social History Social History   Tobacco Use  . Smoking status: Never Smoker  . Smokeless tobacco: Never Used  Vaping Use  . Vaping Use: Never used  Substance Use Topics  . Alcohol use: No  . Drug use: No     Review of Systems  Constitutional: No fever/chills Eyes:  No discharge ENT: No upper respiratory complaints. Respiratory: Patient has cough.  Gastrointestinal:   No nausea, no vomiting.  No diarrhea.  No constipation. Musculoskeletal: Patient has back pain.  Skin: Negative for rash, abrasions, lacerations, ecchymosis.   ____________________________________________   PHYSICAL EXAM:  VITAL SIGNS: ED Triage Vitals  Enc Vitals Group     BP 02/04/20 0940 137/82  Pulse Rate 02/04/20 0940 91     Resp 02/04/20 0940 18     Temp 02/04/20 0940 98.7 F (37.1 C)     Temp Source 02/04/20 0940 Oral     SpO2 02/04/20 0940 98 %     Weight --      Height --      Head Circumference --      Peak Flow --      Pain Score 02/04/20 0955 5     Pain Loc --      Pain Edu? --      Excl. in GC? --      Constitutional: Alert and oriented. Well appearing and in no acute distress. Eyes: Conjunctivae are normal. PERRL. EOMI. Head: Atraumatic. ENT:      Nose: No  congestion/rhinnorhea.      Mouth/Throat: Mucous membranes are moist.  Neck: No stridor.  No cervical spine tenderness to palpation. Cardiovascular: Normal rate, regular rhythm. Normal S1 and S2.  Good peripheral circulation. Respiratory: Normal respiratory effort without tachypnea or retractions. Lungs CTAB. Good air entry to the bases with no decreased or absent breath sounds Skin:  Skin is warm, dry and intact. No rash noted. Psychiatric: Mood and affect are normal for age. Speech and behavior are normal.   ____________________________________________   LABS (all labs ordered are listed, but only abnormal results are displayed)  Labs Reviewed  NOVEL CORONAVIRUS, NAA   ____________________________________________  EKG   ____________________________________________  RADIOLOGY   No results found.  ____________________________________________    PROCEDURES  Procedure(s) performed:     Procedures     Medications - No data to display   ____________________________________________   INITIAL IMPRESSION / ASSESSMENT AND PLAN / ED COURSE  Pertinent labs & imaging results that were available during my care of the patient were reviewed by me and considered in my medical decision making (see chart for details).      Assessment and plan Bronchitis 24 year old female presents to the emergency department with productive cough for green sputum production for approximately 1 month.  Vital signs were reassuring at triage.  On physical exam, patient was resting comfortably with no increased work of breathing.  No adventitious lung sounds were auscultated.  Differential diagnosis includes bronchitis, community-acquired pneumonia, PE...  We will cover patient with azithromycin given long duration of cough and purulent sputum for early community-acquired pneumonia.  Patient was discharged with Tessalon Perles and prednisone as well as meloxicam for her upper back  pain.  Patient is PERC negative at this time.  No need for further work-up for PE at this time.  Rest and hydration were encouraged at home.  Return precautions were given to return with new or worsening symptoms.    ____________________________________________  FINAL CLINICAL IMPRESSION(S) / ED DIAGNOSES  Final diagnoses:  Encounter for screening for COVID-19  Acute bronchitis, unspecified organism      NEW MEDICATIONS STARTED DURING THIS VISIT:  ED Discharge Orders         Ordered    azithromycin (ZITHROMAX) 250 MG tablet  Daily        02/04/20 1009    predniSONE (DELTASONE) 50 MG tablet        02/04/20 1009    benzonatate (TESSALON PERLES) 100 MG capsule  3 times daily PRN,   Status:  Discontinued        02/04/20 1009    benzonatate (TESSALON) 100 MG capsule  3 times daily PRN  02/04/20 1009    benzonatate (TESSALON PERLES) 100 MG capsule  3 times daily PRN        02/04/20 1009              This chart was dictated using voice recognition software/Dragon. Despite best efforts to proofread, errors can occur which can change the meaning. Any change was purely unintentional.     Orvil Feil, PA-C 02/04/20 1017

## 2020-02-05 LAB — NOVEL CORONAVIRUS, NAA: SARS-CoV-2, NAA: NOT DETECTED

## 2020-02-05 LAB — SARS-COV-2, NAA 2 DAY TAT

## 2020-02-08 DIAGNOSIS — Z419 Encounter for procedure for purposes other than remedying health state, unspecified: Secondary | ICD-10-CM | POA: Diagnosis not present

## 2020-03-10 ENCOUNTER — Telehealth: Payer: Self-pay | Admitting: Family Medicine

## 2020-03-10 DIAGNOSIS — Z419 Encounter for procedure for purposes other than remedying health state, unspecified: Secondary | ICD-10-CM | POA: Diagnosis not present

## 2020-03-10 NOTE — Telephone Encounter (Signed)
Patient is calling to schedule an appt for her back. Please advise Cb- [phone]

## 2020-03-11 NOTE — Telephone Encounter (Signed)
Pt is already scheduled for tomorrow 03/12/20.

## 2020-03-11 NOTE — Telephone Encounter (Signed)
Call pt to schedule appt for back pain

## 2020-03-12 ENCOUNTER — Ambulatory Visit (INDEPENDENT_AMBULATORY_CARE_PROVIDER_SITE_OTHER): Payer: Medicaid Other | Admitting: Registered Nurse

## 2020-03-12 ENCOUNTER — Ambulatory Visit (INDEPENDENT_AMBULATORY_CARE_PROVIDER_SITE_OTHER): Payer: Medicaid Other

## 2020-03-12 ENCOUNTER — Other Ambulatory Visit: Payer: Self-pay

## 2020-03-12 ENCOUNTER — Encounter: Payer: Self-pay | Admitting: Registered Nurse

## 2020-03-12 VITALS — BP 135/80 | HR 88 | Temp 100.0°F | Resp 18 | Ht 67.0 in | Wt 316.0 lb

## 2020-03-12 DIAGNOSIS — Z23 Encounter for immunization: Secondary | ICD-10-CM

## 2020-03-12 DIAGNOSIS — M545 Low back pain, unspecified: Secondary | ICD-10-CM

## 2020-03-12 DIAGNOSIS — M5416 Radiculopathy, lumbar region: Secondary | ICD-10-CM | POA: Diagnosis not present

## 2020-03-12 MED ORDER — MELOXICAM 7.5 MG PO TABS: 7.5000 mg | ORAL_TABLET | Freq: Every day | ORAL | 0 refills | Status: DC

## 2020-03-12 MED ORDER — METHOCARBAMOL 500 MG PO TABS: 500.0000 mg | ORAL_TABLET | Freq: Three times a day (TID) | ORAL | 0 refills | Status: DC | PRN

## 2020-03-12 NOTE — Patient Instructions (Addendum)
If you have lab work done today you will be contacted with your lab results within the next 2 weeks.  If you have not heard from [place] then please contact us. The fastest way to get your results is to register for My Chart.   IF you received an x-ray today, you will receive an invoice from [facility]. Please contact [facility] at [phone] with questions or concerns regarding your invoice.   IF you received labwork today, you will receive an invoice from [facility]. Please contact [facility] at [phone] with questions or concerns regarding your invoice.   Our billing staff will not be able to assist you with questions regarding bills from these companies.  You will be contacted with the lab results as soon as they are available. The fastest way to get your results is to activate your My Chart account. Instructions are located on the last page of this paperwork. If you have not heard from [place] regarding the results in 2 weeks, please contact this office.      Chronic Back Pain When back pain lasts longer than 3 months, it is called chronic back pain.The cause of your back pain may not be known. Some common causes include:  Wear and tear (degenerative disease) of the bones, ligaments, or disks in your back.  Inflammation and stiffness in your back (arthritis). People who have chronic back pain often go through certain periods in which the pain is more intense (flare-ups). Many people can learn to manage the pain with home care. Follow these instructions at home: Pay attention to any changes in your symptoms. Take these actions to help with your pain: Activity   Avoid bending and other activities that make the problem worse.  Maintain a proper position when standing or sitting: ? When standing, keep your upper back and neck straight, with your shoulders pulled back. Avoid slouching. ? When sitting, keep your back straight and relax your shoulders. Do not round  your shoulders or pull them backward.  Do not sit or stand in one place for long periods of time.  Take brief periods of rest throughout the day. This will reduce your pain. Resting in a lying or standing position is usually better than sitting to rest.  When you are resting for longer periods, mix in some mild activity or stretching between periods of rest. This will help to prevent stiffness and pain.  Get regular exercise. Ask your health care provider what activities are safe for you.  Do not lift anything that is heavier than 10 lb (4.5 kg). Always use proper lifting technique, which includes: ? Bending your knees. ? Keeping the load close to your body. ? Avoiding twisting.  Sleep on a firm mattress in a comfortable position. Try lying on your side with your knees slightly bent. If you lie on your back, put a pillow under your knees. Managing pain  If directed, apply ice to the painful area. Your health care provider may recommend applying ice during the first 24-48 hours after a flare-up begins. ? Put ice in a plastic bag. ? Place a towel between your skin and the bag. ? Leave the ice on for 20 minutes, 2-3 times per day.  If directed, apply heat to the affected area as often as told by your health care provider. Use the heat source that your health care provider recommends, such as a moist heat pack or a heating pad. ? Place a towel between your skin  and the heat source. ? Leave the heat on for 20-30 minutes. ? Remove the heat if your skin turns bright red. This is especially important if you are unable to feel pain, heat, or cold. You may have a greater risk of getting burned.  Try soaking in a warm tub.  Take over-the-counter and prescription medicines only as told by your health care provider.  Keep all follow-up visits as told by your health care provider. This is important. Contact a health care provider if:  You have pain that is not relieved with rest or  medicine. Get help right away if:  You have weakness or numbness in one or both of your legs or feet.  You have trouble controlling your bladder or your bowels.  You have nausea or vomiting.  You have pain in your abdomen.  You have shortness of breath or you faint. This information is not intended to replace advice given to you by your health care provider. Make sure you discuss any questions you have with your health care provider. Document Revised: 08/17/2018 Document Reviewed: 11/03/2016 Elsevier Patient Education  2020 [employer]  Acute Back Pain, Adult Acute back pain is sudden and usually short-lived. It is often caused by an injury to the muscles and tissues in the back. The injury may result from:  A muscle or ligament getting overstretched or torn (strained). Ligaments are tissues that connect bones to each other. Lifting something improperly can cause a back strain.  Wear and tear (degeneration) of the spinal disks. Spinal disks are circular tissue that provides cushioning between the bones of the spine (vertebrae).  Twisting motions, such as while playing sports or doing yard work.  A hit to the back.  Arthritis. You may have a physical exam, lab tests, and imaging tests to find the cause of your pain. Acute back pain usually goes away with rest and home care. Follow these instructions at home: Managing pain, stiffness, and swelling  Take over-the-counter and prescription medicines only as told by your health care provider.  Your health care provider may recommend applying ice during the first 24-48 hours after your pain starts. To do this: ? Put ice in a plastic bag. ? Place a towel between your skin and the bag. ? Leave the ice on for 20 minutes, 2-3 times a day.  If directed, apply heat to the affected area as often as told by your health care provider. Use the heat source that your health care provider recommends, such as a moist heat pack or a heating  pad. ? Place a towel between your skin and the heat source. ? Leave the heat on for 20-30 minutes. ? Remove the heat if your skin turns bright red. This is especially important if you are unable to feel pain, heat, or cold. You have a greater risk of getting burned. Activity   Do not stay in bed. Staying in bed for more than 1-2 days can delay your recovery.  Sit up and stand up straight. Avoid leaning forward when you sit, or hunching over when you stand. ? If you work at a desk, sit close to it so you do not need to lean over. Keep your chin tucked in. Keep your neck drawn back, and keep your elbows bent at a right angle. Your arms should look like the letter "L." ? Sit high and close to the steering wheel when you drive. Add lower back (lumbar) support to your car seat, if needed.  Take short walks on even surfaces as soon as you are able. Try to increase the length of time you walk each day.  Do not sit, drive, or stand in one place for more than 30 minutes at a time. Sitting or standing for long periods of time can put stress on your back.  Do not drive or use heavy machinery while taking prescription pain medicine.  Use proper lifting techniques. When you bend and lift, use positions that put less stress on your back: ? [place] your knees. ? Keep the load close to your body. ? Avoid twisting.  Exercise regularly as told by your health care provider. Exercising helps your back heal faster and helps prevent back injuries by keeping muscles strong and flexible.  Work with a physical therapist to make a safe exercise program, as recommended by your health care provider. Do any exercises as told by your physical therapist. Lifestyle  Maintain a healthy weight. Extra weight puts stress on your back and makes it difficult to have good posture.  Avoid activities or situations that make you feel anxious or stressed. Stress and anxiety increase muscle tension and can make back pain worse.  Learn ways to manage anxiety and stress, such as through exercise. General instructions  Sleep on a firm mattress in a comfortable position. Try lying on your side with your knees slightly bent. If you lie on your back, put a pillow under your knees.  Follow your treatment plan as told by your health care provider. This may include: ? Cognitive or behavioral therapy. ? Acupuncture or massage therapy. ? Meditation or yoga. Contact a health care provider if:  You have pain that is not relieved with rest or medicine.  You have increasing pain going down into your legs or buttocks.  Your pain does not improve after 2 weeks.  You have pain at night.  You lose weight without trying.  You have a fever or chills. Get help right away if:  You develop new bowel or bladder control problems.  You have unusual weakness or numbness in your arms or legs.  You develop nausea or vomiting.  You develop abdominal pain.  You feel faint. Summary  Acute back pain is sudden and usually short-lived.  Use proper lifting techniques. When you bend and lift, use positions that put less stress on your back.  Take over-the-counter and prescription medicines and apply heat or ice as directed by your health care provider. This information is not intended to replace advice given to you by your health care provider. Make sure you discuss any questions you have with your health care provider. Document Revised: 08/15/2018 Document Reviewed: 12/08/2016 Elsevier Patient Education  2020 [employer].

## 2020-03-12 NOTE — Progress Notes (Signed)
Acute Office Visit  Subjective:    Patient ID: Alexandra Collins, female    DOB: [birth year], 24 y.o.   MRN: [id number]  Chief Complaint  Patient presents with  . 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 Was hoping it resolved. It has not. Acute back pain mostly paraspinal mostly left side Some mild numbness in legs. No saddle symptoms. Feels a "sliding" in back. Is concerned that there is osseous abnormality. No weakness or tingling in legs. No headaches or CNS changes that she is aware of.  Otherwise feeling well and without complaint Would like to receive flu shot today.   Past Medical History:  Diagnosis Date  . Anemia   . Hydradenitis     Past Surgical History:  Procedure Laterality Date  . KNEE ARTHROSCOPY Right     Family History  Problem Relation Age of Onset  . Hypertension Mother   . Seizures Father   . Hypertension Father     Social History   Socioeconomic History  . Marital status: Married    Spouse name: Not on file  . Number of children: Not on file  . Years of education: Not on file  . Highest education level: Not on file  Occupational History  . Not on file  Tobacco Use  . Smoking status: Never Smoker  . Smokeless tobacco: Never Used  Vaping Use  . Vaping Use: Never used  Substance and Sexual Activity  . Alcohol use: No  . Drug use: No  . Sexual activity: Yes    Birth control/protection: Implant  Other Topics Concern  . Not on file  Social History Narrative  . Not on file   Social Determinants of Health   Financial Resource Strain:   . Difficulty of Paying Living Expenses: Not on file  Food Insecurity: No Food Insecurity  . Worried About Programme researcher, broadcasting/film/video in the Last Year:  Never true  . Ran Out of Food in the Last Year: Never true  Transportation Needs: No Transportation Needs  . Lack of Transportation (Medical): No  . Lack of Transportation (Non-Medical): No  Physical Activity:   . Days of Exercise per Week: Not on file  . Minutes of Exercise per Session: Not on file  Stress:   . Feeling of Stress : Not on file  Social Connections:   . Frequency of Communication with Friends and Family: Not on file  . Frequency of Social Gatherings with Friends and Family: Not on file  . Attends Religious Services: Not on file  . Active Member of Clubs or Organizations: Not on file  . Attends Banker Meetings: Not on file  . Marital Status: Not on file  Intimate Partner Violence:   . Fear of Current or Ex-Partner: Not on file  . Emotionally Abused: Not on file  . Physically Abused: Not on file  . Sexually Abused: Not on file    Outpatient Medications Prior to Visit  Medication Sig Dispense Refill  . azithromycin (ZITHROMAX) 250 MG tablet Take 1 tablet (250 mg total) by mouth daily. Take first 2 tablets together, then 1 every day until finished. (Patient not taking: Reported on 03/12/2020)  6 tablet 0  . cetirizine (ZYRTEC ALLERGY) 10 MG tablet Take 1 tablet (10 mg total) by mouth daily. (Patient not taking: Reported on 03/12/2020) 30 tablet 0  . fluticasone (FLONASE) 50 MCG/ACT nasal spray Place 1 spray into both nostrils daily. (Patient not taking: Reported on 03/12/2020) 16 g 0  . predniSONE (DELTASONE) 50 MG tablet Take one tablet once daily for the next five days. (Patient not taking: Reported on 03/12/2020) 5 tablet 0   No facility-administered medications prior to visit.    Allergies  Allergen Reactions  . Ceclor [Cefaclor] Hives  . Garlic Hives    Only allergic to raw garlic  . Penicillins Hives    Has patient had a PCN reaction causing immediate rash, facial/tongue/throat swelling, SOB or lightheadedness with hypotension: Yes Has patient had a  PCN reaction causing severe rash involving mucus membranes or skin necrosis: Yes Has patient had a PCN reaction that required hospitalization: No Has patient had a PCN reaction occurring within the last 10 years: No If all of the above answers are "NO", then may proceed with Cephalosporin use.   . Shellfish Allergy Itching  . Sulfamethoxazole Nausea And Vomiting  . Sulfur Hives  . Augmentin [Amoxicillin-Pot Clavulanate] Rash    Review of Systems  Constitutional: Negative.   HENT: Negative.   Eyes: Negative.   Respiratory: Negative.   Cardiovascular: Negative.   Gastrointestinal: Negative.   Genitourinary: Negative.   Musculoskeletal: Positive for back pain.  Skin: Negative.   Neurological: Negative.   Psychiatric/Behavioral: Negative.        Objective:    Physical Exam Vitals and nursing note reviewed.  Constitutional:      Appearance: Normal appearance. She is obese.  Cardiovascular:     Rate and Rhythm: Normal rate and regular rhythm.  Pulmonary:     Effort: Pulmonary effort is normal. No respiratory distress.  Musculoskeletal:        General: Tenderness and signs of injury present. No swelling or deformity. Normal range of motion.     Right lower leg: No edema.     Left lower leg: No edema.  Skin:    General: Skin is warm and dry.     Capillary Refill: Capillary refill takes less than 2 seconds.  Neurological:     General: No focal deficit present.     Mental Status: She is alert. Mental status is at baseline.  Psychiatric:        Mood and Affect: Mood normal.        Behavior: Behavior normal.        Thought Content: Thought content normal.        Judgment: Judgment normal.     BP 135/80   Pulse 88   Temp 100 F (37.8 C) (Temporal)   Resp 18   Ht 5\' 7"  (1.702 m)   Wt (!) 316 lb (143.3 kg)   SpO2 100%   BMI 49.49 kg/m  Wt Readings from Last 3 Encounters:  03/12/20 (!) 316 lb (143.3 kg)  11/20/19 (!) 310 lb 4.8 oz (140.8 kg)  11/09/19 (!) 311 lb  (141.1 kg)    There are no preventive care reminders to display for this patient.  There are no preventive care reminders to display for this patient.   Lab Results  Component Value Date   TSH 1.790 11/09/2019   Lab Results  Component Value Date   WBC 3.4 11/09/2019   HGB 11.7 11/09/2019   HCT 35.3 11/09/2019   MCV  84 11/09/2019   PLT 452 (H) 11/09/2019   Lab Results  Component Value Date   NA 139 08/01/2019   K 3.4 (L) 08/01/2019   CO2 23 08/01/2019   GLUCOSE 97 08/01/2019   BUN <5 (L) 08/01/2019   CREATININE 0.82 08/01/2019   BILITOT 0.4 08/01/2019   ALKPHOS 140 (H) 08/01/2019   AST 27 08/01/2019   ALT 25 08/01/2019   PROT 6.3 (L) 08/01/2019   ALBUMIN 2.8 (L) 08/01/2019   CALCIUM 8.1 (L) 08/01/2019   ANIONGAP 10 08/01/2019   No results found for: CHOL No results found for: HDL No results found for: LDLCALC No results found for: TRIG No results found for: CHOLHDL No results found for: VXBL3J     Assessment & Plan:   Problem List Items Addressed This Visit    None    Visit Diagnoses    Flu vaccine need    -  Primary   Relevant Orders   Flu Vaccine QUAD 6+ mos PF IM (Fluarix Quad PF) (Completed)   Acute bilateral low back pain without sciatica       Relevant Medications   meloxicam (MOBIC) 7.5 MG tablet   methocarbamol (ROBAXIN) 500 MG tablet   Other Relevant Orders   DG Lumbar Spine Complete (Completed)       Meds ordered this encounter  Medications  . meloxicam (MOBIC) 7.5 MG tablet    Sig: Take 1 tablet (7.5 mg total) by mouth daily.    Dispense:  30 tablet    Refill:  0    Order Specific Question:   Supervising Provider    Answer:   Neva Seat, JEFFREY R [[id number]]  . methocarbamol (ROBAXIN) 500 MG tablet    Sig: Take 1 tablet (500 mg total) by mouth every 8 (eight) hours as needed for muscle spasms.    Dispense:  60 tablet    Refill:  0    Order Specific Question:   Supervising Provider    Answer:   Neva Seat, JEFFREY R [[id number]]   PLAN  With  concern for some radicular symptoms given numbness in legs, dg lumbar spine performed. No abnormalities noted.  Will give meloxicam and methocarbamol for relief  Discussed that stretching will be best for longer term relief.  Return if symptoms worsening or failing to improve after 2 weeks  Patient encouraged to call clinic with any questions, comments, or concerns.   Janeece Agee, NP

## 2020-03-20 ENCOUNTER — Encounter: Payer: Self-pay | Admitting: Registered Nurse

## 2020-03-20 NOTE — Telephone Encounter (Signed)
Pt having back pain trying to exercise it out, please advise action should pt stay away from lifting and strenuous exercise and stick to stretching or otherwise?

## 2020-04-09 DIAGNOSIS — Z419 Encounter for procedure for purposes other than remedying health state, unspecified: Secondary | ICD-10-CM | POA: Diagnosis not present

## 2020-04-23 ENCOUNTER — Emergency Department (HOSPITAL_COMMUNITY)
Admission: EM | Admit: 2020-04-23 | Discharge: 2020-04-23 | Disposition: A | Discharge status: Home / Self Care | Payer: Medicaid Other | Attending: Emergency Medicine | Admitting: Emergency Medicine

## 2020-04-23 ENCOUNTER — Encounter (HOSPITAL_COMMUNITY): Payer: Self-pay

## 2020-04-23 ENCOUNTER — Emergency Department (HOSPITAL_COMMUNITY): Payer: Medicaid Other

## 2020-04-23 ENCOUNTER — Telehealth (HOSPITAL_COMMUNITY): Payer: Self-pay | Admitting: Emergency Medicine

## 2020-04-23 DIAGNOSIS — Y93F2 Activity, caregiving, lifting: Secondary | ICD-10-CM | POA: Insufficient documentation

## 2020-04-23 DIAGNOSIS — Z743 Need for continuous supervision: Secondary | ICD-10-CM | POA: Diagnosis not present

## 2020-04-23 DIAGNOSIS — Y92210 Daycare center as the place of occurrence of the external cause: Secondary | ICD-10-CM | POA: Diagnosis not present

## 2020-04-23 DIAGNOSIS — S3992XA Unspecified injury of lower back, initial encounter: Secondary | ICD-10-CM | POA: Diagnosis present

## 2020-04-23 DIAGNOSIS — M545 Low back pain, unspecified: Secondary | ICD-10-CM | POA: Diagnosis not present

## 2020-04-23 DIAGNOSIS — S39012A Strain of muscle, fascia and tendon of lower back, initial encounter: Secondary | ICD-10-CM | POA: Diagnosis not present

## 2020-04-23 DIAGNOSIS — R5381 Other malaise: Secondary | ICD-10-CM | POA: Diagnosis not present

## 2020-04-23 DIAGNOSIS — R531 Weakness: Secondary | ICD-10-CM | POA: Diagnosis not present

## 2020-04-23 DIAGNOSIS — X500XXA Overexertion from strenuous movement or load, initial encounter: Secondary | ICD-10-CM | POA: Insufficient documentation

## 2020-04-23 DIAGNOSIS — M546 Pain in thoracic spine: Secondary | ICD-10-CM | POA: Diagnosis not present

## 2020-04-23 MED ORDER — DIAZEPAM 5 MG PO TABS
5.0000 mg | ORAL_TABLET | Freq: Once | ORAL | Status: DC
  Filled 2020-04-23: qty 1

## 2020-04-23 MED ORDER — OXYCODONE-ACETAMINOPHEN 5-325 MG PO TABS
1.0000 | ORAL_TABLET | Freq: Once | ORAL | Status: AC
  Administered 2020-04-23: 15:00:00 1 via ORAL
  Filled 2020-04-23: qty 1

## 2020-04-23 MED ORDER — METAXALONE 800 MG PO TABS: 800.0000 mg | ORAL_TABLET | Freq: Three times a day (TID) | ORAL | 0 refills | Status: DC

## 2020-04-23 MED ORDER — KETOROLAC TROMETHAMINE 15 MG/ML IJ SOLN
60.0000 mg | Freq: Once | INTRAMUSCULAR | Status: AC
  Administered 2020-04-23: 15:00:00 60 mg via INTRAMUSCULAR
  Filled 2020-04-23: qty 4

## 2020-04-23 MED ORDER — IBUPROFEN 600 MG PO TABS: 600.0000 mg | ORAL_TABLET | Freq: Four times a day (QID) | ORAL | 0 refills | Status: DC | PRN

## 2020-04-23 MED ORDER — MORPHINE SULFATE (PF) 4 MG/ML IV SOLN
6.0000 mg | Freq: Once | INTRAVENOUS | Status: DC
  Filled 2020-04-23: qty 2

## 2020-04-23 NOTE — ED Notes (Signed)
Pt states she is unable to stand, unable to walk at this time. Dr. Freida Busman aware.

## 2020-04-23 NOTE — Telephone Encounter (Signed)
Patient called to say that prescriptions were sent to the wrong pharmacy.  Patient seen earlier today by Dr. Freida Busman.  I have sent prescriptions for Skelaxin and ibuprofen to [facility] on [address] as requested by the patient.  Previous prescriptions discontinued.

## 2020-04-23 NOTE — ED Triage Notes (Signed)
Pt presents via EMS with c/o back pain. 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 Pt also reports that the pain goes into her left hip.

## 2020-04-23 NOTE — ED Notes (Signed)
Pt refused pain meds ordered, stated her pain was better. Pt encouraged to stand and take a few steps again. Pt now able to stand and walk around room with a steady gait unassisted.

## 2020-04-23 NOTE — ED Notes (Addendum)
An After Visit Summary was printed and given to the patient. Discharge instructions given and no further questions at this time.  Pt leaving with husband. 

## 2020-04-23 NOTE — ED Provider Notes (Signed)
[facility]-EMERGENCY DEPT Provider Note   CSN: [id number] Arrival date & time: 04/23/20  1314     History Chief Complaint  Patient presents with   Back Pain    Alexandra Collins is a 24 y.o. female.  24 year old female presents with thoracolumbar back pain that began today after she picked up her daily care.  History of prior back pain in the past.  Denies any distal numbness or tingling.  Is able to ambulate.  Pain is dull and worse with any movement.  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    Past Surgical History:  Procedure Laterality Date   KNEE ARTHROSCOPY Right      OB History    Gravida  1   Para  1   Term  1   Preterm      AB      Living  1     SAB      IAB      Ectopic      Multiple  0   Live Births  1           Family History  Problem Relation Age of Onset   Hypertension Mother    Seizures Father    Hypertension Father     Social History   Tobacco Use   Smoking status: Never Smoker   Smokeless tobacco: Never Used  Vaping Use   Vaping Use: Never used  Substance Use Topics   Alcohol use: No   Drug use: No    Home Medications Prior to Admission medications   Medication Sig Start Date End Date Taking? Authorizing Provider  azithromycin (ZITHROMAX) 250 MG tablet Take 1 tablet (250 mg total) by mouth daily. Take first 2 tablets together, then 1 every day until finished. Patient not taking: Reported on 03/12/2020 02/04/20   Orvil Feil, PA-C  cetirizine (ZYRTEC ALLERGY) 10 MG tablet Take 1 tablet (10 mg total) by mouth daily. Patient not taking: Reported on 03/12/2020 01/04/20   Hall-Potvin,  [place], PA-C  fluticasone (FLONASE) 50 MCG/ACT nasal spray Place 1 spray into both nostrils daily. Patient not taking: Reported on 03/12/2020 01/04/20   Hall-Potvin, [place], PA-C  meloxicam (MOBIC) 7.5 MG tablet Take 1 tablet (7.5 mg total) by mouth daily. 03/12/20   Janeece Agee, NP  methocarbamol (ROBAXIN) 500 MG tablet Take 1 tablet (500 mg total) by mouth every 8 (eight) hours as needed for muscle spasms. 03/12/20   Janeece Agee, NP  predniSONE (DELTASONE) 50 MG tablet Take one tablet once daily for the next five days. Patient not taking: Reported on 03/12/2020 02/04/20   Orvil Feil, PA-C    Allergies    Ceclor [cefaclor], Garlic, Penicillins, Shellfish allergy, Sulfamethoxazole, Sulfur, and Augmentin [amoxicillin-pot clavulanate]  Review of Systems   Review of Systems  All other systems reviewed and are negative.   Physical Exam Updated Vital Signs BP 134/73 (BP Location: Left Arm)    Pulse 70    Temp 98.8 F (37.1 C) (Oral)    Resp 18    LMP 04/20/2020 (Approximate)    SpO2 100%   Physical Exam Vitals and  nursing note reviewed.  Constitutional:      General: She is not in acute distress.    Appearance: Normal appearance. She is well-developed and well-nourished. She is not toxic-appearing.  HENT:     Head: Normocephalic and atraumatic.  Eyes:     General: Lids are normal.     Extraocular Movements: EOM normal.     Conjunctiva/sclera: Conjunctivae normal.     Pupils: Pupils are equal, round, and reactive to light.  Neck:     Thyroid: No thyroid mass.     Trachea: No tracheal deviation.  Cardiovascular:     Rate and Rhythm: Normal rate and regular rhythm.     Heart sounds: Normal heart sounds. No murmur heard. No gallop.   Pulmonary:     Effort: Pulmonary effort is normal. No respiratory distress.     Breath sounds: Normal breath sounds. No stridor. No decreased breath sounds, wheezing, rhonchi or rales.  Abdominal:     General: Bowel sounds are normal. There  is no distension.     Palpations: Abdomen is soft.     Tenderness: There is no abdominal tenderness. There is no CVA tenderness or rebound.  Musculoskeletal:        General: No tenderness or edema. Normal range of motion.     Cervical back: Normal range of motion and neck supple.       Back:  Skin:    General: Skin is warm and dry.     Findings: No abrasion or rash.  Neurological:     Mental Status: She is alert and oriented to person, place, and time.     GCS: GCS eye subscore is 4. GCS verbal subscore is 5. GCS motor subscore is 6.     Cranial Nerves: No cranial nerve deficit.     Sensory: No sensory deficit.     Motor: Motor function is intact.     Gait: Gait is intact.     Deep Tendon Reflexes: Strength normal.  Psychiatric:        Mood and Affect: Mood and affect normal.        Speech: Speech normal.        Behavior: Behavior normal.     ED Results / Procedures / Treatments   Labs (all labs ordered are listed, but only abnormal results are displayed) Labs Reviewed - No data to display  EKG None  Radiology No results found.  Procedures Procedures (including critical care time)  Medications Ordered in ED Medications  ketorolac (TORADOL) 15 MG/ML injection 60 mg (60 mg Intramuscular Given 04/23/20 1516)  oxyCODONE-acetaminophen (PERCOCET/ROXICET) 5-325 MG per tablet 1 tablet (1 tablet Oral Given 04/23/20 1516)    ED Course  I have reviewed the triage vital signs and the nursing notes.  Pertinent labs & imaging results that were available during my care of the patient were reviewed by me and considered in my medical decision making (see chart for details).    MDM Rules/Calculators/A&P                          Patient treated for likely back strain here.  Will be prescribed anti-inflammatories as well as muscle accidents.  No neurological deficits noted. Final Clinical Impression(s) / ED Diagnoses Final diagnoses:  None    Rx / DC Orders ED Discharge  Orders    None       Lorre Nick, MD 04/23/20 [phone]

## 2020-04-28 ENCOUNTER — Emergency Department (HOSPITAL_COMMUNITY)
Admission: EM | Admit: 2020-04-28 | Discharge: 2020-04-28 | Disposition: A | Discharge status: Home / Self Care | Payer: Medicaid Other | Attending: Emergency Medicine | Admitting: Emergency Medicine

## 2020-04-28 ENCOUNTER — Other Ambulatory Visit: Payer: Self-pay

## 2020-04-28 ENCOUNTER — Emergency Department (HOSPITAL_COMMUNITY): Payer: Medicaid Other

## 2020-04-28 DIAGNOSIS — Z5321 Procedure and treatment not carried out due to patient leaving prior to being seen by health care provider: Secondary | ICD-10-CM | POA: Diagnosis not present

## 2020-04-28 DIAGNOSIS — M79671 Pain in right foot: Secondary | ICD-10-CM | POA: Diagnosis not present

## 2020-04-28 DIAGNOSIS — S99921A Unspecified injury of right foot, initial encounter: Secondary | ICD-10-CM | POA: Diagnosis not present

## 2020-04-28 DIAGNOSIS — R52 Pain, unspecified: Secondary | ICD-10-CM

## 2020-04-28 NOTE — ED Triage Notes (Signed)
C/O recurring pain on right foot and constantly feels like she is re-injured it 3 x since the first incident a week ago

## 2020-04-28 NOTE — ED Notes (Signed)
Pt stated she would follow up with urgent care, pt decided to leave.

## 2020-04-29 ENCOUNTER — Encounter: Payer: Self-pay | Admitting: Emergency Medicine

## 2020-04-29 ENCOUNTER — Ambulatory Visit
Admission: EM | Admit: 2020-04-29 | Discharge: 2020-04-29 | Disposition: A | Discharge status: Home / Self Care | Payer: Medicaid Other | Attending: Family Medicine | Admitting: Family Medicine

## 2020-04-29 DIAGNOSIS — M79674 Pain in right toe(s): Secondary | ICD-10-CM | POA: Diagnosis not present

## 2020-04-29 DIAGNOSIS — S93501A Unspecified sprain of right great toe, initial encounter: Secondary | ICD-10-CM | POA: Diagnosis not present

## 2020-04-29 NOTE — Discharge Instructions (Signed)
Continue with anti inflammatory  Wear post op shoe as needed for support  Follow up with sports medicine if symptoms are not improving

## 2020-04-29 NOTE — ED Triage Notes (Signed)
RT great toe pain, seen at ER yesterday and had xrays but left prior to being seen

## 2020-04-29 NOTE — ED Provider Notes (Signed)
[facility]   [id number] 04/29/20 Arrival Time: 1042  HB:ZJIRC PAIN  SUBJECTIVE: History from: patient. Alexandra Collins is a 24 y.o. female complains of right great toe pain that began 2 weeks ago. Reports that she hurt her toe when jumping off of her mother's bed. Localizes the pain to the dorsal surface of the toe that radiates up the top of the foot. She went to the ER last night but did not stay due to long wait times. Describes the pain as intermittent and achy in character.  Has tried OTC medications without relief. Symptoms are made worse with activity. Denies similar symptoms in the past. Denies fever, chills, erythema, ecchymosis, effusion, weakness, numbness and tingling, saddle paresthesias, loss of bowel or bladder function.      ROS: As per HPI.  All other pertinent ROS negative.     Past Medical History:  Diagnosis Date   Anemia    Hydradenitis    Past Surgical History:  Procedure Laterality Date   KNEE ARTHROSCOPY Right    Allergies  Allergen Reactions   Ceclor [Cefaclor] Hives   Garlic Hives    Only allergic to raw garlic   Penicillins Hives    Has patient had a PCN reaction causing immediate rash, facial/tongue/throat swelling, SOB or lightheadedness with hypotension: Yes Has patient had a PCN reaction causing severe rash involving mucus membranes or skin necrosis: Yes Has patient had a PCN reaction that required hospitalization: No Has patient had a PCN reaction occurring within the last 10 years: No If all of the above answers are "NO", then may proceed with Cephalosporin use.    Shellfish Allergy Itching   Sulfamethoxazole Nausea And Vomiting   Sulfur Hives   Augmentin [Amoxicillin-Pot Clavulanate] Rash   No current facility-administered medications on file prior to encounter.   Current Outpatient Medications on File Prior to Encounter  Medication Sig Dispense Refill   ibuprofen (ADVIL) 600 MG tablet Take 1 tablet (600 mg  total) by mouth every 6 (six) hours as needed. 20 tablet 0   meloxicam (MOBIC) 7.5 MG tablet Take 1 tablet (7.5 mg total) by mouth daily. 30 tablet 0   metaxalone (SKELAXIN) 800 MG tablet Take 1 tablet (800 mg total) by mouth 3 (three) times daily. 21 tablet 0   methocarbamol (ROBAXIN) 500 MG tablet Take 1 tablet (500 mg total) by mouth every 8 (eight) hours as needed for muscle spasms. 60 tablet 0   [DISCONTINUED] cetirizine (ZYRTEC ALLERGY) 10 MG tablet Take 1 tablet (10 mg total) by mouth daily. (Patient not taking: Reported on 03/12/2020) 30 tablet 0   [DISCONTINUED] fluticasone (FLONASE) 50 MCG/ACT nasal spray Place 1 spray into both nostrils daily. (Patient not taking: Reported on 03/12/2020) 16 g 0   Social History   Socioeconomic History   Marital status: Married    Spouse name: Not on file   Number of children: Not on file   Years of education: Not on file   Highest education level: Not on file  Occupational History   Not on file  Tobacco Use   Smoking status: Never Smoker   Smokeless tobacco: Never Used  Vaping Use   Vaping Use: Never used  Substance and Sexual Activity   Alcohol use: No   Drug use: No   Sexual activity: Yes    Birth control/protection: Implant  Other Topics Concern   Not on file  Social History Narrative   Not on file   Social Determinants of Health  Financial Resource Strain: Not on file  Food Insecurity: No Food Insecurity   Worried About Programme researcher, broadcasting/film/video in the Last Year: Never true   Barista in the Last Year: Never true  Transportation Needs: No Transportation Needs   Lack of Transportation (Medical): No   Lack of Transportation (Non-Medical): No  Physical Activity: Not on file  Stress: Not on file  Social Connections: Not on file  Intimate Partner Violence: Not on file   Family History  Problem Relation Age of Onset   Hypertension Mother    Seizures Father    Hypertension Father      OBJECTIVE:  Vitals:   04/29/20 1107  BP: 134/85  Pulse: (!) 105  Resp: 16  Temp: 97.6 F (36.4 C)  TempSrc: Tympanic  SpO2: 98%    General appearance: ALERT; in no acute distress.  Head: NCAT Lungs: Normal respiratory effort CV:  pulses 2+ bilaterally. Cap refill < 2 seconds Musculoskeletal:  Inspection: Skin warm, dry, clear and intact. R great toe swollen  Palpation: R great toe tender to palpation ROM: limited ROM active and passive to R great toe Skin: warm and dry Neurologic: Ambulates without difficulty; Sensation intact about the upper/ lower extremities Psychological: alert and cooperative; normal mood and affect  DIAGNOSTIC STUDIES:  No results found.   ASSESSMENT & PLAN:  1. Sprain of right great toe, initial encounter   2. Pain of right great toe    Reviewed ER note Xray was negative Discussed results with patient Continue conservative management of rest, ice, and gentle stretches Take ibuprofen as needed for pain relief (may cause abdominal discomfort, ulcers, and GI bleeds avoid taking with other NSAIDs) Follow up with PCP if symptoms persist Return or go to the ER if you have any new or worsening symptoms (fever, chills, chest pain, abdominal pain, changes in bowel or bladder habits, pain radiating into lower legs)   Reviewed expectations re: course of current medical issues. Questions answered. Outlined signs and symptoms indicating need for more acute intervention. Patient verbalized understanding. After Visit Summary given.       Moshe Cipro, NP 05/04/20 0830

## 2020-05-07 ENCOUNTER — Encounter: Payer: Self-pay | Admitting: Registered Nurse

## 2020-05-10 DIAGNOSIS — Z419 Encounter for procedure for purposes other than remedying health state, unspecified: Secondary | ICD-10-CM | POA: Diagnosis not present

## 2020-05-19 ENCOUNTER — Other Ambulatory Visit: Payer: Medicaid Other

## 2020-05-19 DIAGNOSIS — Z20822 Contact with and (suspected) exposure to covid-19: Secondary | ICD-10-CM

## 2020-05-20 DIAGNOSIS — Z20822 Contact with and (suspected) exposure to covid-19: Secondary | ICD-10-CM | POA: Diagnosis not present

## 2020-05-21 LAB — SARS-COV-2, NAA 2 DAY TAT

## 2020-05-21 LAB — NOVEL CORONAVIRUS, NAA: SARS-CoV-2, NAA: NOT DETECTED

## 2020-06-10 DIAGNOSIS — Z419 Encounter for procedure for purposes other than remedying health state, unspecified: Secondary | ICD-10-CM | POA: Diagnosis not present

## 2020-06-23 ENCOUNTER — Encounter: Payer: Self-pay | Admitting: Registered Nurse

## 2020-06-23 ENCOUNTER — Other Ambulatory Visit: Payer: Self-pay

## 2020-06-23 ENCOUNTER — Ambulatory Visit (INDEPENDENT_AMBULATORY_CARE_PROVIDER_SITE_OTHER): Payer: Medicaid Other | Admitting: Registered Nurse

## 2020-06-23 VITALS — BP 127/72 | HR 90 | Temp 98.0°F | Resp 18 | Ht 66.5 in | Wt 316.0 lb

## 2020-06-23 DIAGNOSIS — M25552 Pain in left hip: Secondary | ICD-10-CM

## 2020-06-23 DIAGNOSIS — Z111 Encounter for screening for respiratory tuberculosis: Secondary | ICD-10-CM | POA: Diagnosis not present

## 2020-06-23 MED ORDER — IBUPROFEN 600 MG PO TABS: 600.0000 mg | ORAL_TABLET | Freq: Three times a day (TID) | ORAL | 0 refills | Status: DC | PRN

## 2020-06-23 NOTE — Patient Instructions (Signed)
° ° ° °  If you have lab work done today you will be contacted with your lab results within the next 2 weeks.  If you have not heard from us then please contact us. The fastest way to get your results is to register for My Chart. ° ° °IF you received an x-ray today, you will receive an invoice from [facility]. Please contact [facility] at [phone] with questions or concerns regarding your invoice.  ° °IF you received labwork today, you will receive an invoice from [facility]. Please contact [facility] at [phone] with questions or concerns regarding your invoice.  ° °Our billing staff will not be able to assist you with questions regarding bills from these companies. ° °You will be contacted with the lab results as soon as they are available. The fastest way to get your results is to activate your My Chart account. Instructions are located on the last page of this paperwork. If you have not heard from us regarding the results in 2 weeks, please contact this office. °  ° ° ° °

## 2020-06-23 NOTE — Progress Notes (Signed)

## 2020-06-25 ENCOUNTER — Other Ambulatory Visit: Payer: Self-pay

## 2020-06-25 ENCOUNTER — Ambulatory Visit: Payer: Medicaid Other | Admitting: Family Medicine

## 2020-06-25 LAB — TB SKIN TEST
Induration: 0 mm
TB Skin Test: NEGATIVE

## 2020-07-08 DIAGNOSIS — Z419 Encounter for procedure for purposes other than remedying health state, unspecified: Secondary | ICD-10-CM | POA: Diagnosis not present

## 2020-07-16 ENCOUNTER — Emergency Department (HOSPITAL_COMMUNITY)
Admission: EM | Admit: 2020-07-16 | Discharge: 2020-07-17 | Disposition: A | Discharge status: Home / Self Care | Payer: Medicaid Other | Attending: Emergency Medicine | Admitting: Emergency Medicine

## 2020-07-16 ENCOUNTER — Emergency Department (HOSPITAL_COMMUNITY): Payer: Medicaid Other

## 2020-07-16 ENCOUNTER — Other Ambulatory Visit: Payer: Self-pay

## 2020-07-16 DIAGNOSIS — R079 Chest pain, unspecified: Secondary | ICD-10-CM | POA: Diagnosis not present

## 2020-07-16 DIAGNOSIS — S39012A Strain of muscle, fascia and tendon of lower back, initial encounter: Secondary | ICD-10-CM | POA: Diagnosis not present

## 2020-07-16 DIAGNOSIS — S3992XA Unspecified injury of lower back, initial encounter: Secondary | ICD-10-CM | POA: Diagnosis present

## 2020-07-16 DIAGNOSIS — X58XXXA Exposure to other specified factors, initial encounter: Secondary | ICD-10-CM | POA: Diagnosis not present

## 2020-07-16 NOTE — ED Triage Notes (Signed)
Pt c/o right scapular pain; denies injury. Took a muscle relaxer with no relief.

## 2020-07-17 LAB — URINALYSIS, ROUTINE W REFLEX MICROSCOPIC
Bilirubin Urine: NEGATIVE
Glucose, UA: NEGATIVE mg/dL
Hgb urine dipstick: NEGATIVE
Ketones, ur: NEGATIVE mg/dL
Leukocytes,Ua: NEGATIVE
Nitrite: NEGATIVE
Protein, ur: NEGATIVE mg/dL
Specific Gravity, Urine: 1.02 (ref 1.005–1.030)
pH: 6 (ref 5.0–8.0)

## 2020-07-17 MED ORDER — LIDOCAINE 5 % EX PTCH: 1.0000 | MEDICATED_PATCH | CUTANEOUS | 0 refills | Status: DC

## 2020-07-17 MED ORDER — IBUPROFEN 800 MG PO TABS: 800.0000 mg | ORAL_TABLET | Freq: Three times a day (TID) | ORAL | 0 refills | Status: DC

## 2020-07-17 MED ORDER — CYCLOBENZAPRINE HCL 10 MG PO TABS: 10.0000 mg | ORAL_TABLET | Freq: Two times a day (BID) | ORAL | 0 refills | Status: DC | PRN

## 2020-07-17 MED ORDER — CYCLOBENZAPRINE HCL 10 MG PO TABS
10.0000 mg | ORAL_TABLET | Freq: Once | ORAL | Status: AC
  Administered 2020-07-17: 10 mg via ORAL
  Filled 2020-07-17: qty 1

## 2020-07-17 MED ORDER — KETOROLAC TROMETHAMINE 60 MG/2ML IM SOLN
30.0000 mg | Freq: Once | INTRAMUSCULAR | Status: AC
  Administered 2020-07-17: 30 mg via INTRAMUSCULAR
  Filled 2020-07-17: qty 2

## 2020-07-17 NOTE — ED Provider Notes (Signed)
[facility] EMERGENCY DEPARTMENT Provider Note   CSN: [id number] Arrival date & time: 07/16/20  1856     History Chief Complaint  Patient presents with  . Back Pain    Alexandra Collins is a 25 y.o. female.  25 year old female woke up with some back pain earlier today.  Progressive versus other day.  Worse with movement.  Worse with palpation.  Its sharp sometimes shooting sometimes but mostly just painful.  Denies any heavy.  No bowel or bladder incontinence.  No weakness in her legs.   Back Pain      Past Medical History:  Diagnosis Date  . Anemia   . Hydradenitis     Patient Active Problem List   Diagnosis Date Noted  . History of gestational hypertension 08/24/2019  . History of pre-eclampsia 08/24/2019  . Foot pain 03/05/2013  . Acne 02/26/2013  . 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 KNEE ARTHROSCOPY Right      OB History    Gravida  1   Para  1   Term  1   Preterm      AB      Living  1     SAB      IAB      Ectopic      Multiple  0   Live Births  1           Family History  Problem Relation Age of Onset  . Hypertension Mother   . Seizures Father   . Hypertension Father     Social History   Tobacco Use  . Smoking status: Never Smoker  . Smokeless tobacco: Never Used  Vaping Use  . Vaping Use: Never used  Substance Use Topics  . Alcohol use: No  . Drug use: No    Home Medications Prior to Admission medications   Medication Sig Start Date End Date Taking? Authorizing Provider  cyclobenzaprine (FLEXERIL) 10 MG tablet Take 1 tablet (10 mg total) by mouth 2 (two) times daily as needed for muscle spasms. 07/17/20  Yes Evelynn Hench, Barbara Cower, MD  ibuprofen (ADVIL) 800 MG tablet Take 1 tablet (800 mg total) by mouth 3 (three) times daily. 07/17/20  Yes Yanette Tripoli, Barbara Cower, MD  lidocaine (LIDODERM) 5 % Place 1 patch onto the skin daily. Remove & Discard patch within  12 hours or as directed by MD 07/17/20  Yes Tannar Broker, Barbara Cower, MD  meloxicam (MOBIC) 7.5 MG tablet Take 1 tablet (7.5 mg total) by mouth daily. 03/12/20   Janeece Agee, NP  metaxalone (SKELAXIN) 800 MG tablet Take 1 tablet (800 mg total) by mouth 3 (three) times daily. 04/23/20   Geoffery Lyons, MD  methocarbamol (ROBAXIN) 500 MG tablet Take 1 tablet (500 mg total) by mouth every 8 (eight) hours as needed for muscle spasms. 03/12/20   Janeece Agee, NP  cetirizine (ZYRTEC ALLERGY) 10 MG tablet Take 1 tablet (10 mg total) by mouth daily. Patient not taking: Reported on 03/12/2020 01/04/20 04/29/20  Hall-Potvin, [place], PA-C  fluticasone (FLONASE) 50 MCG/ACT nasal spray Place 1 spray into both nostrils daily. Patient not taking: Reported on 03/12/2020 01/04/20 04/29/20  Hall-Potvin, [place], PA-C    Allergies    Ceclor [cefaclor], Elemental sulfur, Garlic, Penicillins, Shellfish allergy, Sulfamethoxazole, and Augmentin [amoxicillin-pot clavulanate]  Review of Systems   Review of Systems  Musculoskeletal: Positive for back pain.  All other systems reviewed and are negative.   Physical Exam Updated  Vital Signs BP 105/69   Pulse 68   Temp 97.8 F (36.6 C) (Oral)   Resp 18   Ht 5\' 6"  (1.676 m)   Wt (!) 141.5 kg   SpO2 100%   BMI 50.36 kg/m   Physical Exam Vitals and nursing note reviewed.  Constitutional:      Appearance: She is well-developed.  HENT:     Head: Normocephalic and atraumatic.     Nose: Nose normal. No congestion or rhinorrhea.     Mouth/Throat:     Mouth: Mucous membranes are moist.     Pharynx: Oropharynx is clear.  Eyes:     Pupils: Pupils are equal, round, and reactive to light.  Cardiovascular:     Rate and Rhythm: Normal rate and regular rhythm.  Pulmonary:     Effort: No respiratory distress.     Breath sounds: No stridor.  Abdominal:     General: Abdomen is flat. There is no distension.  Musculoskeletal:        General: Tenderness present. No swelling  or signs of injury. Normal range of motion.     Cervical back: Normal range of motion.  Skin:    General: Skin is warm and dry.  Neurological:     General: No focal deficit present.     Mental Status: She is alert.  Psychiatric:        Mood and Affect: Mood normal.     ED Results / Procedures / Treatments   Labs (all labs ordered are listed, but only abnormal results are displayed) Labs Reviewed  URINALYSIS, ROUTINE W REFLEX MICROSCOPIC - Abnormal; Notable for the following components:      Result Value   APPearance HAZY (*)    All other components within normal limits    EKG None  Radiology DG Chest 2 View  Result Date: 07/16/2020 CLINICAL DATA:  Left-sided chest pain for several hours EXAM: CHEST - 2 VIEW COMPARISON:  07/31/2019 FINDINGS: The heart size and mediastinal contours are within normal limits. Both lungs are clear. The visualized skeletal structures are unremarkable. IMPRESSION: No active cardiopulmonary disease. Electronically Signed   By: 08/02/2019 M.D.   On: 07/16/2020 20:32    Procedures Procedures   Medications Ordered in ED Medications  ketorolac (TORADOL) injection 30 mg (30 mg Intramuscular Given 07/17/20 0156)  cyclobenzaprine (FLEXERIL) tablet 10 mg (10 mg Oral Given 07/17/20 0156)    ED Course  I have reviewed the triage vital signs and the nursing notes.  Pertinent labs & imaging results that were available during my care of the patient were reviewed by me and considered in my medical decision making (see chart for details).    MDM Rules/Calculators/A&P                          Suspect likely muscular pain.  Treated for the same.  Multiple suggestions offered.  PCP care follow-up.  Doubt spinal injury without any trauma, recent illnesses or any neurologic symptoms.  No evidence of rash to suggest shingles.  No trauma suggest bony injury.  No indication for imaging.  Final Clinical Impression(s) / ED Diagnoses Final diagnoses:  Strain of  lumbar region, initial encounter    Rx / DC Orders ED Discharge Orders         Ordered    lidocaine (LIDODERM) 5 %  Every 24 hours        07/17/20 0257    ibuprofen (ADVIL) 800  MG tablet  3 times daily        07/17/20 0257    cyclobenzaprine (FLEXERIL) 10 MG tablet  2 times daily PRN        07/17/20 0257           Braeson Rupe, Barbara Cower, MD 07/17/20 [phone]

## 2020-08-08 DIAGNOSIS — Z419 Encounter for procedure for purposes other than remedying health state, unspecified: Secondary | ICD-10-CM | POA: Diagnosis not present

## 2020-09-07 DIAGNOSIS — Z419 Encounter for procedure for purposes other than remedying health state, unspecified: Secondary | ICD-10-CM | POA: Diagnosis not present

## 2020-10-08 DIAGNOSIS — Z419 Encounter for procedure for purposes other than remedying health state, unspecified: Secondary | ICD-10-CM | POA: Diagnosis not present

## 2020-10-17 IMAGING — US US OB COMP LESS 14 WK
1 series · 15 of 28 positions shown · non-contrast
Comparison: None.

CLINICAL DATA: Pain

EXAM:
OBSTETRIC <14 WK ULTRASOUND
TECHNIQUE: Transabdominal ultrasound was performed for evaluation of the
gestation as well as the maternal uterus and adnexal regions.

[Series 1: us ob comp less 14 wk · 15 of 47 slices shown]
[im 1/47]
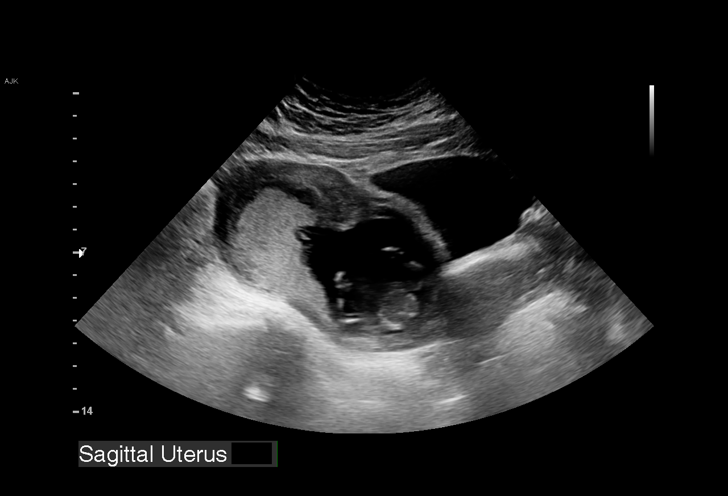
[im 4/47]
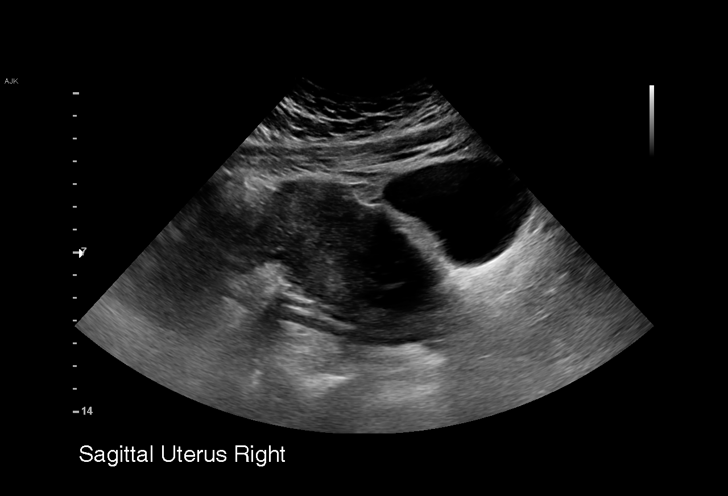
[im 7/47]
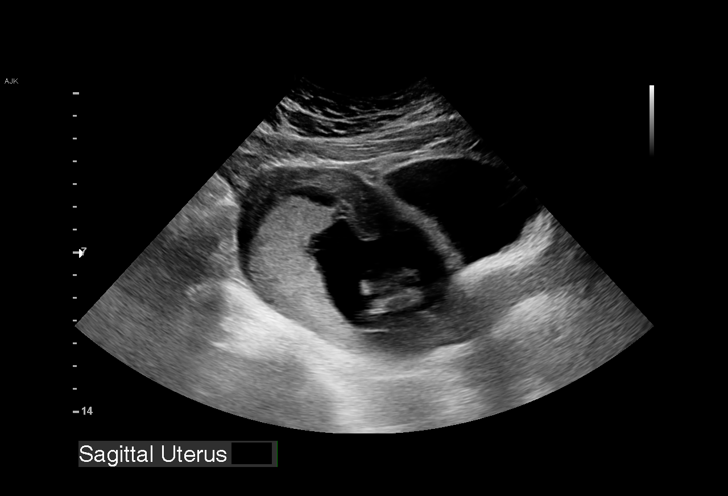
[im 11/47]
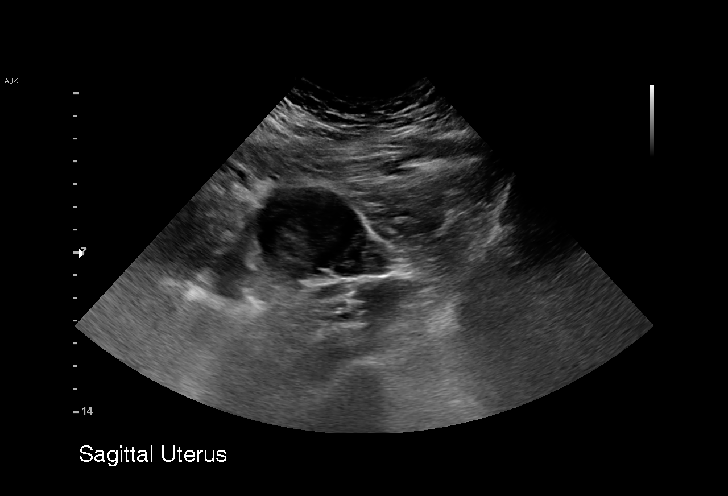
[im 14/47]
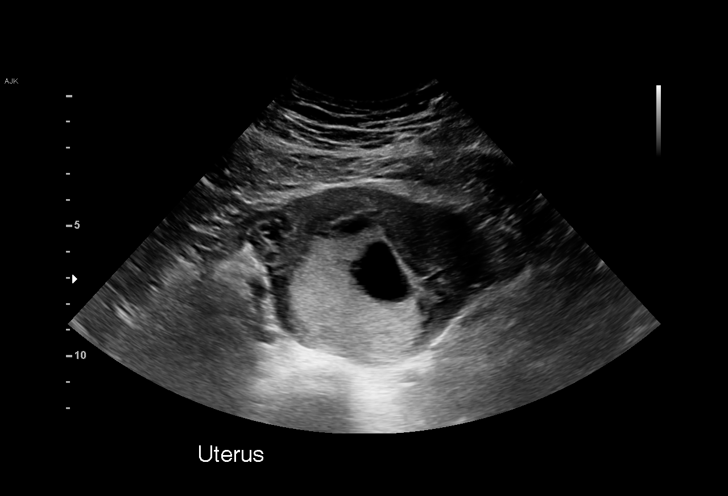
[im 18/47]
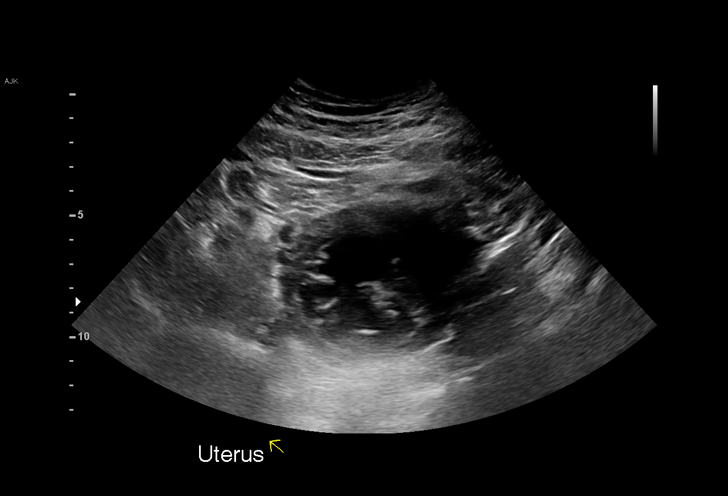
[im 21/47]
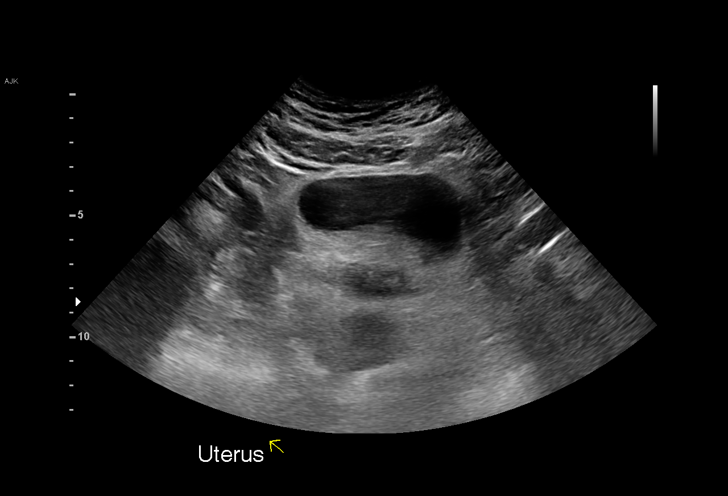
[im 24/47]
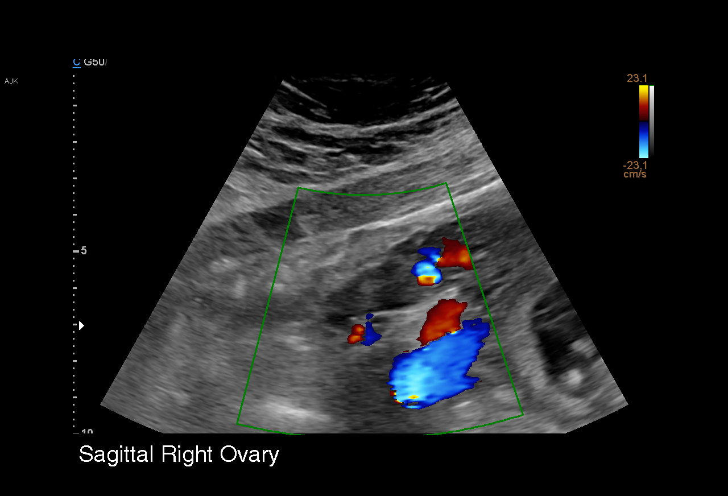
[im 26/47]
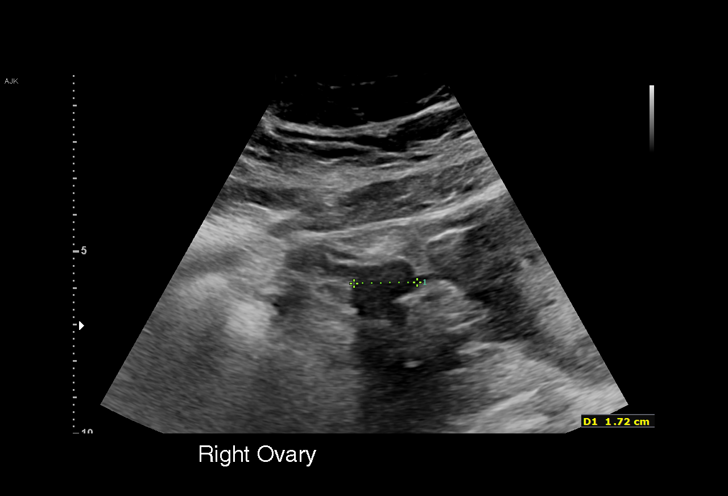
[im 29/47]
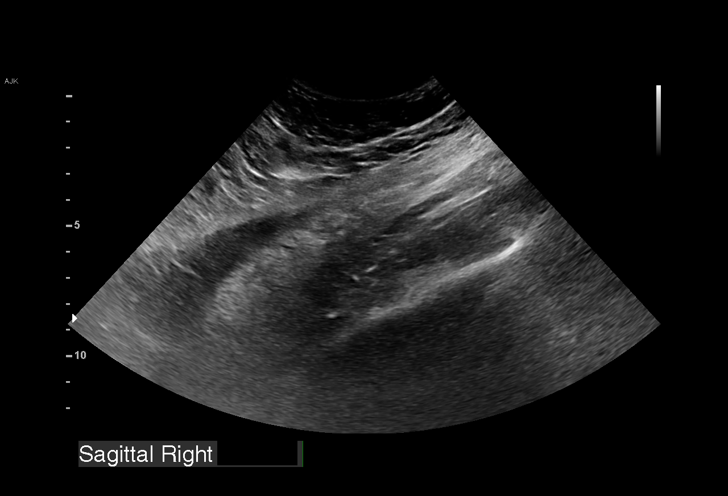
[im 33/47]
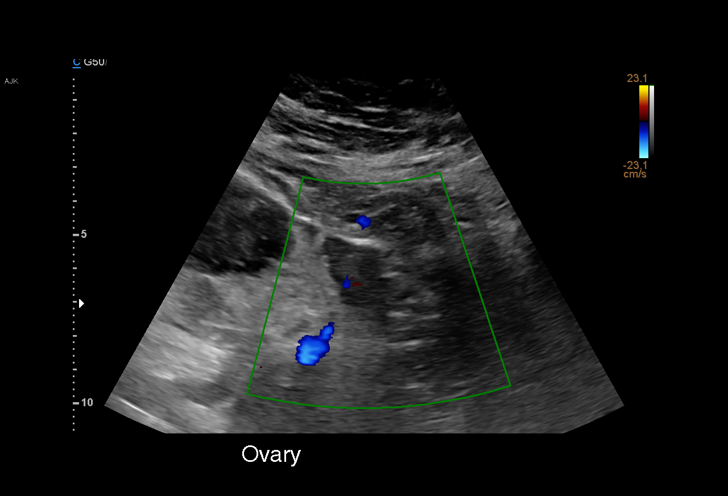
[im 36/47]
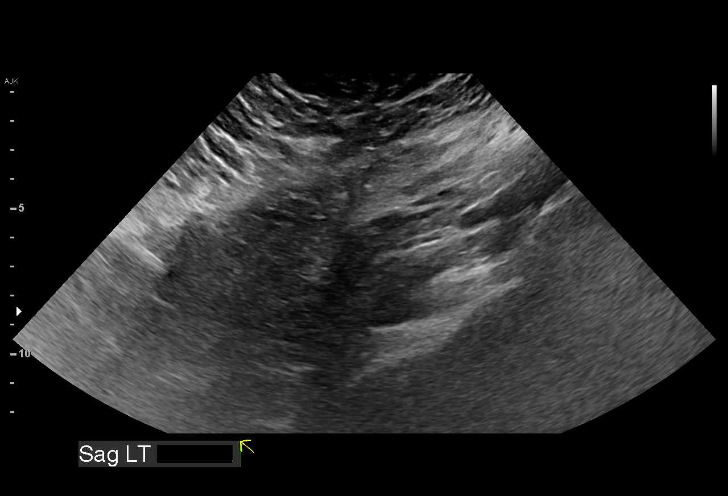
[im 40/47]
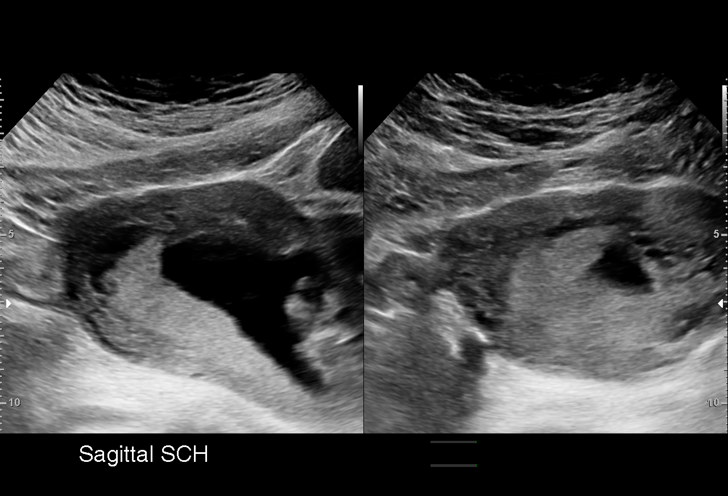
[im 43/47]
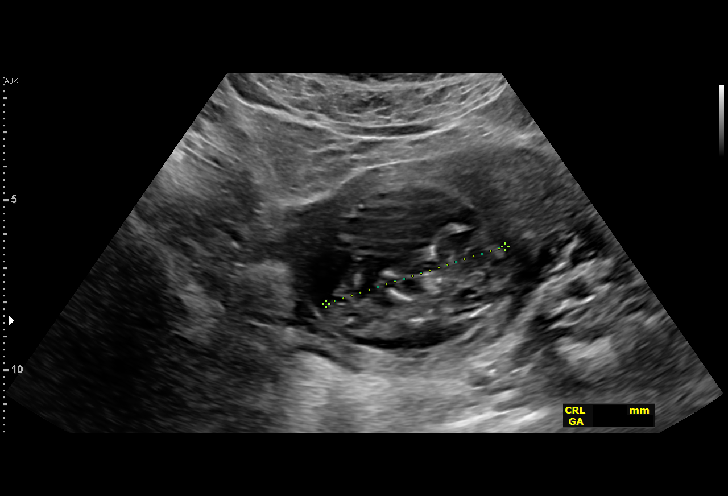
[im 47/47]
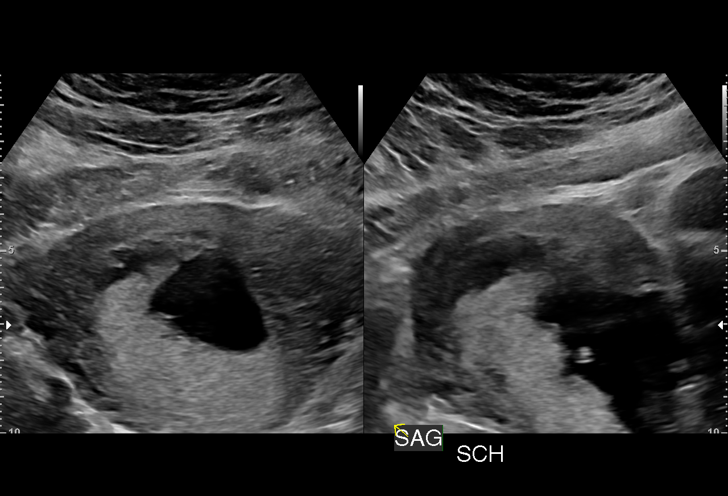

[15 of 28 positions shown; findings below may reference images not displayed]

FINDINGS: LMP: 07/18/2018

GA by LMP: 26 w  6 d

EDC by LMP: 04/24/2019

GA by first ultrasound: 13 weeks 1 days

EDC by first ultrasound: 07/29/2019

Intrauterine gestational sac: Single

Yolk sac:  Not Visualized.

Embryo:  Visualized.

Cardiac Activity: Visualized.

Heart Rate: 150 bpm

CRL:   55.8 mm   12 w 1 d                  US EDC: 08/05/2019

Subchorionic hemorrhage:  Trace subchorionic hemorrhage is noted.

Maternal uterus/adnexae: Maternal structures are unremarkable. No
free fluid.
IMPRESSION: Single viable intrauterine gestation within estimated gestational
age 12 weeks 1 day, similar to prior outside sonographic dating.

Small volume subchorionic hemorrhage. Consider obstetrical
consultation.

Nonvisualization of the yolk sac may be a normal finding following
10 weeks gestation.

## 2020-10-29 ENCOUNTER — Telehealth (INDEPENDENT_AMBULATORY_CARE_PROVIDER_SITE_OTHER): Payer: Medicaid Other | Admitting: Family Medicine

## 2020-10-29 ENCOUNTER — Encounter: Payer: Self-pay | Admitting: Family Medicine

## 2020-10-29 DIAGNOSIS — R0989 Other specified symptoms and signs involving the circulatory and respiratory systems: Secondary | ICD-10-CM

## 2020-10-29 DIAGNOSIS — R059 Cough, unspecified: Secondary | ICD-10-CM | POA: Diagnosis not present

## 2020-10-29 NOTE — Progress Notes (Signed)
Virtual Visit via Video Note  I connected with Alexandra Collins on 10/29/20 at 4:45 PM by a video enabled telemedicine application and verified that I am speaking with the correct person using two identifiers.  Patient location:home My location: Office - [place]    I discussed the limitations, risks, security and privacy concerns of performing an evaluation and management service by telephone and the availability of in person appointments. I also discussed with the patient that there may be a patient responsible charge related to this service. The patient expressed understanding and agreed to proceed, consent obtained  Chief complaint:  Chief Complaint  Patient presents with   Nasal Congestion    Pt reports head pressure, congestion in sinus, tried theraflu with no relief, cold sweats, body aches and nausea with that. Pt reports this has been 2 days      History of Present Illness: Alexandra Collins is a 25 y.o. female  Nasal congestion.  Eye watering, scratchy throat initially on 6/19. More congestion, headache and head felt heavy, cough, chills over next 2 days.  Had rapid covid test yesterday that was negative.   Has not repeated test.  No known sick contacts.  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  Heavy feeling in   Tx: theraflu cold and flu.   Had covid vaccine in April and May last year. No booster. Family had covid infection in January.  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   Past Medical History:  Diagnosis Date   Anemia    Hydradenitis    Past Surgical History:  Procedure Laterality Date   KNEE ARTHROSCOPY Right    Allergies  Allergen Reactions   Ceclor [Cefaclor] Hives   Elemental Sulfur Hives   Garlic Hives    Only allergic to raw garlic    Penicillins Hives    Has patient had a PCN reaction causing immediate rash, facial/tongue/throat swelling, SOB or lightheadedness with hypotension: Yes Has patient had a PCN reaction causing severe rash involving mucus membranes or skin necrosis: Yes Has patient had a PCN reaction that required hospitalization: No Has patient had a PCN reaction occurring within the last 10 years: No If all of the above answers are "NO", then may proceed with Cephalosporin use.    Shellfish Allergy Itching   Sulfamethoxazole Nausea And Vomiting   Augmentin [Amoxicillin-Pot Clavulanate] Rash   Prior to Admission medications   Medication Sig Start Date End Date Taking? Authorizing Provider  cyclobenzaprine (FLEXERIL) 10 MG tablet Take 1 tablet (10 mg total) by mouth 2 (two) times daily as needed for muscle spasms. 07/17/20  Yes Mesner, Barbara Cower, MD  ibuprofen (ADVIL) 800 MG tablet Take 1 tablet (800 mg total) by mouth 3 (three) times daily. 07/17/20  Yes Mesner, Barbara Cower, MD  lidocaine (LIDODERM) 5 % Place 1 patch onto the skin daily. Remove & Discard patch within 12 hours or as directed by MD 07/17/20  Yes Mesner, Barbara Cower, MD  meloxicam (MOBIC) 7.5 MG tablet Take 1 tablet (7.5 mg total) by mouth daily. 03/12/20   Janeece Agee, NP  metaxalone (SKELAXIN) 800 MG tablet Take 1 tablet (800 mg total) by mouth 3 (three) times daily. 04/23/20   Geoffery Lyons, MD  methocarbamol (ROBAXIN) 500 MG tablet Take 1 tablet (500 mg total) by mouth every 8 (eight) hours  as needed for muscle spasms. 03/12/20   Janeece Agee, NP  cetirizine (ZYRTEC ALLERGY) 10 MG tablet Take 1 tablet (10 mg total) by mouth daily. Patient not taking: Reported on 03/12/2020 01/04/20 04/29/20  Hall-Potvin, [place], PA-C  fluticasone (FLONASE) 50 MCG/ACT nasal spray Place 1 spray into both nostrils daily. Patient not taking: Reported on 03/12/2020 01/04/20 04/29/20  Hall-Potvin, [place], PA-C   Social History   Socioeconomic History   Marital status: Married     Spouse name: Not on file   Number of children: Not on file   Years of education: Not on file   Highest education level: Not on file  Occupational History   Not on file  Tobacco Use   Smoking status: Never   Smokeless tobacco: Never  Vaping Use   Vaping Use: Never used  Substance and Sexual Activity   Alcohol use: No   Drug use: No   Sexual activity: Yes    Birth control/protection: Implant  Other Topics Concern   Not on file  Social History Narrative   Not on file   Social Determinants of Health   Financial Resource Strain: Not on file  Food Insecurity: No Food Insecurity   Worried About Running Out of Food in the Last Year: Never true   Ran Out of Food in the Last Year: Never true  Transportation Needs: No Transportation Needs   Lack of Transportation (Medical): No   Lack of Transportation (Non-Medical): No  Physical Activity: Not on file  Stress: Not on file  Social Connections: Not on file  Intimate Partner Violence: Not on file    Observations/Objective: Neck supple, nontoxic appearance.  Speaking in full sentences without respiratory distress.  All questions were answered with understanding of plan expressed.  Assessment and Plan: Cough  Chest congestion Suspected viral syndrome, differential includes COVID-19 with false negative initial rapid test.  Recommended repeating COVID test tonight, and if positive can consider antivirals as within 5 days.  Symptomatic care discussed for now with Tylenol, Mucinex or Mucinex DM, fluids, saline nasal spray.  RTC/ER precautions.  Follow Up Instructions: ER precautions.   I discussed the assessment and treatment plan with the patient. The patient was provided an opportunity to ask questions and all were answered. The patient agreed with the plan and demonstrated an understanding of the instructions.   The patient was advised to call back or seek an in-person evaluation if the symptoms worsen or if the condition fails to  improve as anticipated.  I provided 20 minutes of non-face-to-face time during this encounter.   Shade Flood, MD

## 2020-10-29 NOTE — Patient Instructions (Addendum)
Tylenol for body aches. Mucinex or mucinex or mucinex dm for cough. Saline nasal spray if needed for congestion.

## 2020-11-07 DIAGNOSIS — Z419 Encounter for procedure for purposes other than remedying health state, unspecified: Secondary | ICD-10-CM | POA: Diagnosis not present

## 2020-12-08 DIAGNOSIS — Z419 Encounter for procedure for purposes other than remedying health state, unspecified: Secondary | ICD-10-CM | POA: Diagnosis not present

## 2021-01-08 DIAGNOSIS — Z419 Encounter for procedure for purposes other than remedying health state, unspecified: Secondary | ICD-10-CM | POA: Diagnosis not present

## 2021-02-05 ENCOUNTER — Ambulatory Visit
Admission: EM | Admit: 2021-02-05 | Discharge: 2021-02-05 | Disposition: A | Discharge status: Home / Self Care | Payer: BC Managed Care – PPO | Attending: Internal Medicine | Admitting: Internal Medicine

## 2021-02-05 ENCOUNTER — Other Ambulatory Visit: Payer: Self-pay

## 2021-02-05 ENCOUNTER — Encounter: Payer: Self-pay | Admitting: Emergency Medicine

## 2021-02-05 DIAGNOSIS — R300 Dysuria: Secondary | ICD-10-CM | POA: Diagnosis present

## 2021-02-05 DIAGNOSIS — Z113 Encounter for screening for infections with a predominantly sexual mode of transmission: Secondary | ICD-10-CM | POA: Diagnosis present

## 2021-02-05 DIAGNOSIS — J069 Acute upper respiratory infection, unspecified: Secondary | ICD-10-CM | POA: Diagnosis present

## 2021-02-05 DIAGNOSIS — N898 Other specified noninflammatory disorders of vagina: Secondary | ICD-10-CM | POA: Insufficient documentation

## 2021-02-05 DIAGNOSIS — Z20822 Contact with and (suspected) exposure to covid-19: Secondary | ICD-10-CM | POA: Diagnosis present

## 2021-02-05 DIAGNOSIS — Z3202 Encounter for pregnancy test, result negative: Secondary | ICD-10-CM | POA: Diagnosis present

## 2021-02-05 DIAGNOSIS — J029 Acute pharyngitis, unspecified: Secondary | ICD-10-CM | POA: Insufficient documentation

## 2021-02-05 LAB — POCT URINALYSIS DIP (MANUAL ENTRY)
Bilirubin, UA: NEGATIVE
Blood, UA: NEGATIVE
Glucose, UA: NEGATIVE mg/dL
Leukocytes, UA: NEGATIVE
Nitrite, UA: NEGATIVE
Protein Ur, POC: NEGATIVE mg/dL
Spec Grav, UA: >=1.03 — AB (ref 1.010–1.025)
Urobilinogen, UA: 0.2 E.U./dL
pH, UA: 6.5 (ref 5.0–8.0)

## 2021-02-05 LAB — POCT URINE PREGNANCY: Preg Test, Ur: NEGATIVE

## 2021-02-05 LAB — POCT RAPID STREP A (OFFICE): Rapid Strep A Screen: NEGATIVE

## 2021-02-05 NOTE — ED Triage Notes (Signed)
Patient c/o fatigue, sore throat, body chills, nausea since yesterday.  Patient denies any OTC meds.  Patient is vaccinated for COVID.

## 2021-02-05 NOTE — ED Triage Notes (Signed)
First call no answer sent to vm

## 2021-02-05 NOTE — ED Triage Notes (Signed)
Patient requesting a HCG test.

## 2021-02-05 NOTE — ED Provider Notes (Signed)
[facility]    CSN: [id number] Arrival date & time: 02/05/21  1252      History   Chief Complaint Chief Complaint  Patient presents with   Fatigue   Sore Throat    HPI Alexandra Collins is a 25 y.o. female.   Patient presents 1 day history of fatigue, sore throat, nasal congestion, body chills, nausea that started yesterday.  Denies any cough, known fevers, vomiting, diarrhea, abdominal pain.  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  Denies any chest pain or shortness of breath.  Patient also requesting a pregnancy test as she has been having "ovarian pain".  She describes this as a lower abdominal pain.  Also been experiencing some white-colored vaginal discharge that started approximately 2 to 3 days ago.  Has some associated urinary burning as well but denies urinary frequency, fever, back pain, pelvic pain, hematuria.  Last menstrual cycle is unknown as patient has irregular menstrual cycles due to having an IUD in place.  Denies any known exposure to STD but has had unprotected sexual intercourse recently.   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 Authorizing Provider  cyclobenzaprine (FLEXERIL) 10 MG tablet Take 1 tablet (10 mg total) by mouth 2  (two) times daily as needed for muscle spasms. 07/17/20   Mesner, Barbara Cower, MD  ibuprofen (ADVIL) 800 MG tablet Take 1 tablet (800 mg total) by mouth 3 (three) times daily. 07/17/20   Mesner, Barbara Cower, MD  lidocaine (LIDODERM) 5 % Place 1 patch onto the skin daily. Remove & Discard patch within 12 hours or as directed by MD 07/17/20   Mesner, Barbara Cower, MD  meloxicam (MOBIC) 7.5 MG tablet Take 1 tablet (7.5 mg total) by mouth daily. 03/12/20   Janeece Agee, NP  metaxalone (SKELAXIN) 800 MG tablet Take 1 tablet (800 mg total) by mouth 3 (three) times daily. 04/23/20   Geoffery Lyons, MD  methocarbamol (ROBAXIN) 500 MG tablet Take 1 tablet (500 mg total) by mouth every 8 (eight) hours as needed for muscle spasms. 03/12/20   Janeece Agee, NP  cetirizine (ZYRTEC ALLERGY) 10 MG tablet Take 1 tablet (10 mg total) by mouth daily. Patient not taking: Reported on 03/12/2020 01/04/20 04/29/20  Hall-Potvin, [place], PA-C  fluticasone (FLONASE) 50 MCG/ACT nasal spray Place 1 spray into both nostrils daily. Patient not taking: Reported on 03/12/2020 01/04/20 04/29/20  Hall-Potvin, [place], PA-C    Family History Family History  Problem Relation Age of  Onset   Hypertension Mother    Seizures Father    Hypertension Father     Social History Social History   Tobacco Use   Smoking status: Never   Smokeless tobacco: Never  Vaping Use   Vaping Use: Never used  Substance Use Topics   Alcohol use: No   Drug use: No     Allergies   Ceclor [cefaclor], Elemental sulfur, Garlic, Penicillins, Shellfish allergy, Sulfamethoxazole, and Augmentin [amoxicillin-pot clavulanate]   Review of Systems Review of Systems Per HPI  Physical Exam Triage Vital Signs ED Triage Vitals  Enc Vitals Group     BP 02/05/21 1435 114/72     Pulse Rate 02/05/21 1435 82     Resp 02/05/21 1435 18     Temp 02/05/21 1435 98.1 F (36.7 C)     Temp Source 02/05/21 1435 Oral     SpO2 02/05/21 1435 98 %     Weight --      Height --       Head Circumference --      Peak Flow --      Pain Score 02/05/21 1439 0     Pain Loc --      Pain Edu? --      Excl. in GC? --    No data found.  Updated Vital Signs BP 114/72 (BP Location: Left Arm)   Pulse 82   Temp 98.1 F (36.7 C) (Oral)   Resp 18   SpO2 98%   Breastfeeding No   Visual Acuity Right Eye Distance:   Left Eye Distance:   Bilateral Distance:    Right Eye Near:   Left Eye Near:    Bilateral Near:     Physical Exam Constitutional:      General: She is not in acute distress.    Appearance: Normal appearance. She is not toxic-appearing or diaphoretic.  HENT:     Head: Normocephalic and atraumatic.     Right Ear: Tympanic membrane and ear canal normal.     Left Ear: Tympanic membrane and ear canal normal.     Nose: Congestion present.     Mouth/Throat:     Mouth: Mucous membranes are moist.     Pharynx: Posterior oropharyngeal erythema present.  Eyes:     Extraocular Movements: Extraocular movements intact.     Conjunctiva/sclera: Conjunctivae normal.     Pupils: Pupils are equal, round, and reactive to light.  Cardiovascular:     Rate and Rhythm: Normal rate and regular rhythm.     Pulses: Normal pulses.     Heart sounds: Normal heart sounds.  Pulmonary:     Effort: Pulmonary effort is normal. No respiratory distress.     Breath sounds: Normal breath sounds. No wheezing.  Abdominal:     General: Bowel sounds are normal. There is no distension.     Palpations: Abdomen is soft.     Tenderness: There is no abdominal tenderness.  Genitourinary:    Comments: Deferred with shared decision making.  Self swab performed. Musculoskeletal:        General: Normal range of motion.     Cervical back: Normal range of motion.  Skin:    General: Skin is warm and dry.  Neurological:     General: No focal deficit present.     Mental Status: She is alert and oriented to person, place, and time. Mental status is at baseline.  Psychiatric:        Mood and  Affect: Mood normal.        Behavior: Behavior normal.     UC Treatments / Results  Labs (all labs ordered are listed, but only abnormal results are displayed) Labs Reviewed  POCT URINALYSIS DIP (MANUAL ENTRY) - Abnormal; Notable for the following components:      Result Value   Ketones, POC UA small (15) (*)    Spec Grav, UA >=1.030 (*)    All other components within normal limits  CULTURE, GROUP A STREP ([facility])  NOVEL CORONAVIRUS, NAA  URINE CULTURE  POCT URINE PREGNANCY  POCT RAPID STREP A (OFFICE)  CERVICOVAGINAL ANCILLARY ONLY    EKG   Radiology No results found.  Procedures Procedures (including critical care time)  Medications Ordered in UC Medications - No data to display  Initial Impression / Assessment and Plan / UC Course  I have reviewed the triage vital signs and the nursing notes.  Pertinent labs & imaging results that were available during my care of the patient were reviewed by me and considered in my medical decision making (see chart for details).     Patient presents with symptoms likely from a viral upper respiratory infection. Differential includes bacterial pneumonia, sinusitis, allergic rhinitis, Covid 19. Do not suspect underlying cardiopulmonary process. Symptoms seem unlikely related to ACS, CHF or COPD exacerbations, pneumonia, pneumothorax. Patient is nontoxic appearing and not in need of emergent medical intervention.  Recommended symptom control with over the counter medications: Daily oral anti-histamine, Oral decongestant or IN corticosteroid, saline irrigations, cepacol lozenges, Robitussin, Delsym, honey tea.  Rapid strep test was negative.  Throat culture and COVID-19 viral swab are pending.  Urine pregnancy was negative.  Urinalysis did not show signs of urinary tract infection.  Urine culture and cervicovaginal swab are pending.  Will await treatment until test results are complete.  Return if symptoms fail to improve in 1-2 weeks  or you develop shortness of breath, chest pain, severe headache. Patient states understanding and is agreeable.  Discharged with PCP followup.  Final Clinical Impressions(s) / UC Diagnoses   Final diagnoses:  Sore throat  Encounter for laboratory testing for COVID-19 virus  Dysuria  Vaginal discharge  Viral upper respiratory tract infection with cough  Screening examination for venereal disease  Pregnancy test negative     Discharge Instructions      You likely having a viral upper respiratory infection. We recommended symptom control. I expect your symptoms to start improving in the next 1-2 weeks.   1. Take a daily allergy pill/anti-histamine like Zyrtec, Claritin, or Store brand consistently for 2 weeks  2. For congestion you may try an oral decongestant like Mucinex or sudafed. You may also try intranasal flonase nasal spray or saline irrigations (neti pot, sinus cleanse)  3. For your sore throat you may try cepacol lozenges, salt water gargles, throat spray. Treatment of congestion may also help your sore throat.  4. For cough you may try Robitussen, Mucinex DM  5. Take Tylenol or Ibuprofen to help with pain/inflammation  6. Stay hydrated, drink plenty of fluids to keep throat coated and less irritated  Honey Tea For cough/sore throat try using a honey-based tea. Use 3 teaspoons of honey with juice squeezed from half lemon. Place shaved pieces of ginger into 1/2-1 cup of water and warm over stove top. Then mix the ingredients and repeat every 4 hours as needed.   Your rapid strep test was negative.  Throat culture is pending.  COVID-19 viral swab is also  pending.  We will call if it is positive.  Your urine pregnancy was negative.  Your urinalysis was also negative.  Urine culture and vaginal swab are pending.  We will call if they are positive and treat as appropriate.     ED Prescriptions   None    PDMP not reviewed this encounter.   Lance Muss,  FNP 02/05/21 1521

## 2021-02-05 NOTE — Discharge Instructions (Addendum)
You likely having a viral upper respiratory infection. We recommended symptom control. I expect your symptoms to start improving in the next 1-2 weeks.   1. Take a daily allergy pill/anti-histamine like Zyrtec, Claritin, or Store brand consistently for 2 weeks  2. For congestion you may try an oral decongestant like Mucinex or sudafed. You may also try intranasal flonase nasal spray or saline irrigations (neti pot, sinus cleanse)  3. For your sore throat you may try cepacol lozenges, salt water gargles, throat spray. Treatment of congestion may also help your sore throat.  4. For cough you may try Robitussen, Mucinex DM  5. Take Tylenol or Ibuprofen to help with pain/inflammation  6. Stay hydrated, drink plenty of fluids to keep throat coated and less irritated  Honey Tea For cough/sore throat try using a honey-based tea. Use 3 teaspoons of honey with juice squeezed from half lemon. Place shaved pieces of ginger into 1/2-1 cup of water and warm over stove top. Then mix the ingredients and repeat every 4 hours as needed.   Your rapid strep test was negative.  Throat culture is pending.  COVID-19 viral swab is also pending.  We will call if it is positive.  Your urine pregnancy was negative.  Your urinalysis was also negative.  Urine culture and vaginal swab are pending.  We will call if they are positive and treat as appropriate.

## 2021-02-06 LAB — CERVICOVAGINAL ANCILLARY ONLY
Bacterial Vaginitis (gardnerella): NEGATIVE
Candida Glabrata: NEGATIVE
Candida Vaginitis: NEGATIVE
Chlamydia: NEGATIVE
Comment: NEGATIVE
Comment: NEGATIVE
Comment: NEGATIVE
Comment: NEGATIVE
Comment: NEGATIVE
Comment: NORMAL
Neisseria Gonorrhea: NEGATIVE
Trichomonas: NEGATIVE

## 2021-02-06 LAB — URINE CULTURE

## 2021-02-06 LAB — NOVEL CORONAVIRUS, NAA: SARS-CoV-2, NAA: NOT DETECTED

## 2021-02-06 LAB — SARS-COV-2, NAA 2 DAY TAT

## 2021-02-07 DIAGNOSIS — Z419 Encounter for procedure for purposes other than remedying health state, unspecified: Secondary | ICD-10-CM | POA: Diagnosis not present

## 2021-02-08 LAB — CULTURE, GROUP A STREP (THRC)

## 2021-02-26 ENCOUNTER — Ambulatory Visit
Admission: EM | Admit: 2021-02-26 | Discharge: 2021-02-26 | Disposition: A | Discharge status: Home / Self Care | Payer: BC Managed Care – PPO | Attending: Physician Assistant | Admitting: Physician Assistant

## 2021-02-26 ENCOUNTER — Other Ambulatory Visit: Payer: Self-pay

## 2021-02-26 DIAGNOSIS — J069 Acute upper respiratory infection, unspecified: Secondary | ICD-10-CM | POA: Diagnosis not present

## 2021-02-26 NOTE — ED Provider Notes (Signed)
[facility]    CSN: [id number] Arrival date & time: 02/26/21  1507      History   Chief Complaint Chief Complaint  Patient presents with   Cough    HPI Reghan Thul is a 25 y.o. female.   Patient here today for evaluation of chills, congestion, cough, sore throat that started today.  She reports she has had some body aches as well.  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  She denies any vomiting but has had loss of appetite and some diarrhea.  She does not report any treatment for symptoms.  The history is provided by the patient.  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 Authorizing Provider  cyclobenzaprine (FLEXERIL) 10 MG tablet Take 1 tablet (10 mg total) by mouth 2 (two) times daily as needed for muscle spasms. 07/17/20   Mesner, Barbara Cower, MD  ibuprofen (ADVIL) 800 MG tablet Take 1 tablet (800 mg total) by mouth 3 (three) times daily. 07/17/20   Mesner, Barbara Cower, MD  lidocaine (LIDODERM) 5 % Place 1 patch onto the skin daily. Remove & Discard patch within 12 hours or as directed by MD 07/17/20   Mesner, Barbara Cower, MD  meloxicam (MOBIC) 7.5 MG tablet Take 1  tablet (7.5 mg total) by mouth daily. 03/12/20   Janeece Agee, NP  metaxalone (SKELAXIN) 800 MG tablet Take 1 tablet (800 mg total) by mouth 3 (three) times daily. 04/23/20   Geoffery Lyons, MD  methocarbamol (ROBAXIN) 500 MG tablet Take 1 tablet (500 mg total) by mouth every 8 (eight) hours as needed for muscle spasms. 03/12/20   Janeece Agee, NP  cetirizine (ZYRTEC ALLERGY) 10 MG tablet Take 1 tablet (10 mg total) by mouth daily. Patient not taking: Reported on 03/12/2020 01/04/20 04/29/20  Hall-Potvin, [place], PA-C  fluticasone (FLONASE) 50 MCG/ACT nasal spray Place 1 spray into both nostrils daily. Patient not taking: Reported on 03/12/2020 01/04/20 04/29/20  Hall-Potvin, [place], PA-C    Family History Family History  Problem Relation Age of Onset   Hypertension Mother    Seizures Father    Hypertension Father     Social History Social History   Tobacco Use   Smoking status: Never   Smokeless tobacco: Never  Vaping Use   Vaping Use: Never used  Substance Use Topics   Alcohol use: No  Drug use: No     Allergies   Ceclor [cefaclor], Elemental sulfur, Garlic, Penicillins, Shellfish allergy, Sulfamethoxazole, and Augmentin [amoxicillin-pot clavulanate]   Review of Systems Review of Systems  Constitutional:  Positive for chills. Negative for fever.  HENT:  Positive for congestion, sinus pressure and sore throat. Negative for ear pain.   Eyes:  Negative for discharge and redness.  Respiratory:  Positive for cough. Negative for shortness of breath and wheezing.   Gastrointestinal:  Positive for diarrhea. Negative for abdominal pain, nausea and vomiting.    Physical Exam Triage Vital Signs ED Triage Vitals [02/26/21 1648]  Enc Vitals Group     BP 134/85     Pulse Rate (!) 124     Resp 18     Temp 98.5 F (36.9 C)     Temp Source Oral     SpO2 97 %     Weight      Height      Head Circumference      Peak Flow      Pain Score 0     Pain Loc      Pain Edu?       Excl. in GC?    No data found.  Updated Vital Signs BP 134/85 (BP Location: Left Arm)   Pulse (!) 124   Temp 98.5 F (36.9 C) (Oral)   Resp 18   SpO2 97%   Breastfeeding No      Physical Exam Vitals and nursing note reviewed.  Constitutional:      General: She is not in acute distress.    Appearance: Normal appearance. She is not ill-appearing.  HENT:     Head: Normocephalic and atraumatic.     Right Ear: Tympanic membrane normal.     Left Ear: Tympanic membrane normal.     Nose: Congestion present.     Mouth/Throat:     Mouth: Mucous membranes are moist.     Pharynx: No oropharyngeal exudate or posterior oropharyngeal erythema.  Eyes:     Conjunctiva/sclera: Conjunctivae normal.  Cardiovascular:     Rate and Rhythm: Normal rate and regular rhythm.     Heart sounds: Normal heart sounds. No murmur heard. Pulmonary:     Effort: Pulmonary effort is normal. No respiratory distress.     Breath sounds: Normal breath sounds. No wheezing, rhonchi or rales.  Skin:    General: Skin is warm and dry.  Neurological:     Mental Status: She is alert.  Psychiatric:        Mood and Affect: Mood normal.        Thought Content: Thought content normal.     UC Treatments / Results  Labs (all labs ordered are listed, but only abnormal results are displayed) Labs Reviewed  COVID-19, FLU A+B NAA    EKG   Radiology No results found.  Procedures Procedures (including critical care time)  Medications Ordered in UC Medications - No data to display  Initial Impression / Assessment and Plan / UC Course  I have reviewed the triage vital signs and the nursing notes.  Pertinent labs & imaging results that were available during my care of the patient were reviewed by me and considered in my medical decision making (see chart for details).  Suspect likely viral etiology of symptoms and will order COVID and flu screening.  Will await results further recommendation.  In the  meantime encouraged symptomatic treatment and rest and follow-up with any further concerns.  Final  Clinical Impressions(s) / UC Diagnoses   Final diagnoses:  Acute upper respiratory infection   Discharge Instructions   None    ED Prescriptions   None    PDMP not reviewed this encounter.   Tomi Bamberger, PA-C 02/26/21 1736

## 2021-02-26 NOTE — ED Triage Notes (Signed)
Pt c/o chest congestion, cough, headache, body aches, ears ringing, sore neck, diarrhea, loss of appetite. Denies nausea, vomiting, constipation. Onset today.

## 2021-02-27 LAB — COVID-19, FLU A+B NAA
Influenza A, NAA: NOT DETECTED
Influenza B, NAA: NOT DETECTED
SARS-CoV-2, NAA: NOT DETECTED

## 2021-03-09 ENCOUNTER — Ambulatory Visit: Payer: BC Managed Care – PPO | Admitting: Family Medicine

## 2021-03-10 DIAGNOSIS — Z419 Encounter for procedure for purposes other than remedying health state, unspecified: Secondary | ICD-10-CM | POA: Diagnosis not present

## 2021-04-09 DIAGNOSIS — Z419 Encounter for procedure for purposes other than remedying health state, unspecified: Secondary | ICD-10-CM | POA: Diagnosis not present

## 2021-04-27 ENCOUNTER — Other Ambulatory Visit: Payer: Self-pay | Admitting: Family Medicine

## 2021-04-27 ENCOUNTER — Other Ambulatory Visit: Payer: Self-pay

## 2021-04-27 ENCOUNTER — Ambulatory Visit (INDEPENDENT_AMBULATORY_CARE_PROVIDER_SITE_OTHER): Payer: BC Managed Care – PPO | Admitting: Family Medicine

## 2021-04-27 ENCOUNTER — Encounter: Payer: Self-pay | Admitting: Family Medicine

## 2021-04-27 VITALS — BP 130/82 | HR 70 | Temp 98.3°F | Resp 18 | Ht 66.0 in | Wt 284.4 lb

## 2021-04-27 DIAGNOSIS — R599 Enlarged lymph nodes, unspecified: Secondary | ICD-10-CM | POA: Diagnosis not present

## 2021-04-27 DIAGNOSIS — L732 Hidradenitis suppurativa: Secondary | ICD-10-CM

## 2021-04-27 DIAGNOSIS — J039 Acute tonsillitis, unspecified: Secondary | ICD-10-CM

## 2021-04-27 DIAGNOSIS — R509 Fever, unspecified: Secondary | ICD-10-CM

## 2021-04-27 LAB — POCT RAPID STREP A (OFFICE): Rapid Strep A Screen: NEGATIVE

## 2021-04-27 LAB — POC INFLUENZA A&B (BINAX/QUICKVUE)
Influenza A, POC: NEGATIVE
Influenza B, POC: NEGATIVE

## 2021-04-27 MED ORDER — MUPIROCIN 2 % EX OINT: 1.0000 "application " | TOPICAL_OINTMENT | Freq: Two times a day (BID) | CUTANEOUS | 0 refills | Status: DC

## 2021-04-27 MED ORDER — AZITHROMYCIN 250 MG PO TABS: ORAL_TABLET | ORAL | 0 refills | Status: AC

## 2021-04-27 MED ORDER — MUPIROCIN CALCIUM 2 % EX CREA: 1.0000 "application " | TOPICAL_CREAM | Freq: Two times a day (BID) | CUTANEOUS | 0 refills | Status: DC

## 2021-04-27 NOTE — Patient Instructions (Addendum)
Ok to apply the antibiotic ointment to hidradenitis wounds. Bump in groin is likely a reactive lymph node. I expect that to improve in next few weeks. Recheck if not resolving or sooner if enlarging/worsening.   Start azithromycin for possible strep throat. See info below. Sore throat lozenges, tylenol as needed for fever. Return to the clinic or go to the nearest emergency room if any of your symptoms worsen or new symptoms occur.  Strep Throat, Adult Strep throat is an infection in the throat that is caused by bacteria. It is common during the cold months of the year. It mostly affects children who are 11-29 years old. However, people of all ages can get it at any time of the year. This infection spreads from person to person (is contagious) through coughing, sneezing, or having close contact. Your health care provider may use other names to describe the infection. When strep throat affects the tonsils, it is called tonsillitis. When it affects the back of the throat, it is called pharyngitis. What are the causes? This condition is caused by the Streptococcus pyogenes bacteria. What increases the risk? You are more likely to develop this condition if: You care for school-age children, or are around school-age children. Children are more likely to get strep throat and may spread it to others. You spend time in crowded places where the infection can spread easily. You have close contact with someone who has strep throat. What are the signs or symptoms? Symptoms of this condition include: Fever or chills. Redness, swelling, or pain in the tonsils or throat. Pain or difficulty when swallowing. White or yellow spots on the tonsils or throat. Tender glands in the neck and under the jaw. Bad smelling breath. Red rash all over the body. This is rare. How is this diagnosed? This condition is diagnosed by tests that check for the presence and the amount of bacteria that cause strep throat. They  are: Rapid strep test. Your throat is swabbed and checked for the presence of bacteria. Results are usually ready in minutes. Throat culture test. Your throat is swabbed. The sample is placed in a cup that allows infections to grow. Results are usually ready in 1 or 2 days. How is this treated? This condition may be treated with: Medicines that kill germs (antibiotics). Medicines that relieve pain or fever. These include: Ibuprofen or acetaminophen. Aspirin, only for people who are over the age of 24. Throat lozenges. Throat sprays. Follow these instructions at home: Medicines  Take over-the-counter and prescription medicines only as told by your health care provider. Take your antibiotic medicine as told by your health care provider. Do not stop taking the antibiotic even if you start to feel better. Eating and drinking  If you have trouble swallowing, try eating soft foods until your sore throat feels better. Drink enough fluid to keep your urine pale yellow. To help relieve pain, you may have: Warm fluids, such as soup and tea. Cold fluids, such as frozen desserts or popsicles. General instructions Gargle with a salt-water mixture 3-4 times a day or as needed. To make a salt-water mixture, completely dissolve -1 tsp (3-6 g) of salt in 1 cup (237 mL) of warm water. Get plenty of rest. Stay home from work or school until you have been taking antibiotics for 24 hours. Do not use any products that contain nicotine or tobacco. These products include cigarettes, chewing tobacco, and vaping devices, such as e-cigarettes. If you need help quitting, ask your health care  provider. It is up to you to get your test results. Ask your health care provider, or the department that is doing the test, when your results will be ready. Keep all follow-up visits. This is important. How is this prevented?  Do not share food, drinking cups, or personal items that could cause the infection to spread to  other people. Wash your hands often with soap and water for at least 20 seconds. If soap and water are not available, use hand sanitizer. Make sure that all people in your house wash their hands well. Have family members tested if they have a sore throat or fever. They may need an antibiotic if they have strep throat. Contact a health care provider if: You have swelling in your neck that keeps getting bigger. You develop a rash, cough, or earache. You cough up a thick mucus that is green, yellow-brown, or bloody. You have pain or discomfort that does not get better with medicine. Your symptoms seem to be getting worse. You have a fever. Get help right away if: You have new symptoms, such as vomiting, severe headache, stiff or painful neck, chest pain, or shortness of breath. You have severe throat pain, drooling, or changes in your voice. You have swelling of the neck, or the skin on the neck becomes red and tender. You have signs of dehydration, such as tiredness (fatigue), dry mouth, and decreased urination. You become increasingly sleepy, or you cannot wake up completely. Your joints become red or painful. These symptoms may represent a serious problem that is an emergency. Do not wait to see if the symptoms will go away. Get medical help right away. Call your local emergency services (911 in the [place] Do not drive yourself to the hospital. Summary Strep throat is an infection in the throat that is caused by the Streptococcus pyogenes bacteria. This infection is spread from person to person (is contagious) through coughing, sneezing, or having close contact. Take your medicines, including antibiotics, as told by your health care provider. Do not stop taking the antibiotic even if you start to feel better. To prevent the spread of germs, wash your hands well with soap and water. Have others do the same. Do not share food, drinking cups, or personal items. Get help right away if you have new  symptoms, such as vomiting, severe headache, stiff or painful neck, chest pain, or shortness of breath. This information is not intended to replace advice given to you by your health care provider. Make sure you discuss any questions you have with your health care provider. Document Revised: 08/19/2020 Document Reviewed: 08/19/2020 Elsevier Patient Education  2022 [employer].   If you have lab work done today you will be contacted with your lab results within the next 2 weeks.  If you have not heard from [place] then please contact us. The fastest way to get your results is to register for My Chart.   IF you received an x-ray today, you will receive an invoice from [facility]. Please contact [facility] at [phone] with questions or concerns regarding your invoice.   IF you received labwork today, you will receive an invoice from [place]. Please contact [facility] at [phone] with questions or concerns regarding your invoice.   Our billing staff will not be able to assist you with questions regarding bills from these companies.  You will be contacted with the lab results as soon as they are available. The fastest way to get your results is to  activate your My Chart account. Instructions are located on the last page of this paperwork. If you have not heard from [place] regarding the results in 2 weeks, please contact this office.

## 2021-04-27 NOTE — Progress Notes (Signed)
Subjective:  Patient ID: Alexandra Collins, female    DOB: 1996/04/15  Age: 25 y.o. MRN: [id number]  CC:  Chief Complaint  Patient presents with   Sore Throat    Patient is having a sore throat since Thursday and believes it is strep throat. She has took some OTC medications for the sore throat. She also is having some nausea , fever of 102, fatigue, body aches.   Pain    Patient is also having some pelvic pain on right side pelvic area and said its a lump. She as noticed it two weeks ago.    HPI Alexandra Collins presents for  Presents with 2 concerns above  Sore throat With fever, fatigue, nausea, body aches, fever up to 102 yesterday. White spots on throat last night, swollen tonsils.  Sore throat present since 4 days ago.nasal congestion, no cough Sick contact - daughter with congestion. No covid testing.  COVID-vaccine April, May 2021, booster Flu vaccine - did not receive this year.  Tx: thera flu, alka seltzer. Able to eat and drink.   Right side pelvic pain Feels sore, lump past 2 weeks. Hx of hidradenitis- 2 in lower abdomen - recently drained - applying A& D oitment. Sore in lower area, lump in R groin. Currently on menses, spotting this week. On implanon.  No hx of STI, no new sexual partners.  Increased urination - more often, past few days, no dysuria.   History 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   Past Medical History:  Diagnosis Date   Anemia    Hydradenitis    Past Surgical History:  Procedure Laterality Date   KNEE ARTHROSCOPY Right    Allergies  Allergen Reactions   Ceclor [Cefaclor] Hives   Elemental Sulfur Hives   Garlic Hives    Only allergic to raw garlic   Penicillins Hives    Has patient had a PCN reaction causing immediate rash, facial/tongue/throat  swelling, SOB or lightheadedness with hypotension: Yes Has patient had a PCN reaction causing severe rash involving mucus membranes or skin necrosis: Yes Has patient had a PCN reaction that required hospitalization: No Has patient had a PCN reaction occurring within the last 10 years: No If all of the above answers are "NO", then may proceed with Cephalosporin use.    Shellfish Allergy Itching   Sulfamethoxazole Nausea And Vomiting   Augmentin [Amoxicillin-Pot Clavulanate] Rash   Prior to Admission medications   Medication Sig Start Date End Date Taking? Authorizing Provider  cyclobenzaprine (FLEXERIL) 10 MG tablet Take 1 tablet (10 mg total) by mouth 2 (two) times daily as needed for muscle spasms. Patient not taking: Reported on 04/27/2021 07/17/20   Mesner, Barbara Cower, MD  ibuprofen (ADVIL) 800 MG tablet Take 1 tablet (800 mg total) by mouth 3 (three) times daily. Patient not taking: Reported on 04/27/2021 07/17/20   Mesner, Barbara Cower, MD  lidocaine (LIDODERM) 5 % Place 1 patch onto the skin daily. Remove & Discard patch within 12 hours or as directed by MD Patient not taking: Reported on 04/27/2021 07/17/20   Mesner, Barbara Cower, MD  meloxicam (MOBIC) 7.5 MG tablet Take 1 tablet (7.5 mg total) by mouth daily. 03/12/20   Janeece Agee, NP  metaxalone (SKELAXIN) 800 MG tablet Take 1 tablet (800 mg total) by mouth 3 (three) times daily. 04/23/20  Geoffery Lyons, MD  methocarbamol (ROBAXIN) 500 MG tablet Take 1 tablet (500 mg total) by mouth every 8 (eight) hours as needed for muscle spasms. 03/12/20   Janeece Agee, NP  cetirizine (ZYRTEC ALLERGY) 10 MG tablet Take 1 tablet (10 mg total) by mouth daily. Patient not taking: Reported on 03/12/2020 01/04/20 04/29/20  Hall-Potvin, [place], PA-C  fluticasone (FLONASE) 50 MCG/ACT nasal spray Place 1 spray into both nostrils daily. Patient not taking: Reported on 03/12/2020 01/04/20 04/29/20  Hall-Potvin, [place], PA-C   Social History   Socioeconomic History    Marital status: Married    Spouse name: Not on file   Number of children: Not on file   Years of education: Not on file   Highest education level: Not on file  Occupational History   Not on file  Tobacco Use   Smoking status: Never   Smokeless tobacco: Never  Vaping Use   Vaping Use: Never used  Substance and Sexual Activity   Alcohol use: No   Drug use: No   Sexual activity: Yes    Birth control/protection: Implant  Other Topics Concern   Not on file  Social History Narrative   Not on file   Social Determinants of Health   Financial Resource Strain: Not on file  Food Insecurity: Not on file  Transportation Needs: Not on file  Physical Activity: Not on file  Stress: Not on file  Social Connections: Not on file  Intimate Partner Violence: Not on file    Review of Systems Per HPI.   Objective:   Vitals:   04/27/21 0837  BP: 130/82  Pulse: 70  Resp: 18  Temp: 98.3 F (36.8 C)  TempSrc: Temporal  SpO2: 99%  Weight: 284 lb 6.4 oz (129 kg)  Height: 5\' 6"  (1.676 m)     Physical Exam Constitutional:      General: She is not in acute distress.    Appearance: Normal appearance. She is well-developed.  HENT:     Head: Normocephalic and atraumatic.     Right Ear: External ear normal.     Left Ear: External ear normal.     Mouth/Throat:     Pharynx: Oropharyngeal exudate (white - yellow on bil tonsils. uvual midline. clearing secretions, no stridor.) present.  Neck:     Comments: Mild ttp of neck but on specific palpable nodes.  Cardiovascular:     Rate and Rhythm: Normal rate.  Pulmonary:     Effort: Pulmonary effort is normal.  Abdominal:     General: Abdomen is flat. There is no distension.     Palpations: Abdomen is soft.     Tenderness: There is no abdominal tenderness. There is no guarding.    Neurological:     Mental Status: She is alert and oriented to person, place, and time.  Psychiatric:        Mood and Affect: Mood normal.   Results for  orders placed or performed in visit on 04/27/21  POCT rapid strep A  Result Value Ref Range   Rapid Strep A Screen Negative Negative  POC Influenza A&B(BINAX/QUICKVUE)  Result Value Ref Range   Influenza A, POC Negative Negative   Influenza B, POC Negative Negative     Assessment & Plan:  Alexandra Collins is a 25 y.o. female . Exudative tonsillitis - Plan: POCT rapid strep A, azithromycin (ZITHROMAX) 250 MG tablet  Hidradenitis - Plan: mupirocin cream (BACTROBAN) 2 %  Reactive lymphadenopathy  Fever, unspecified fever cause -  Plan: POC Influenza A&B(BINAX/QUICKVUE), POC COVID-19  Suspected strep pharyngitis with exudative tonsillitis.  Differential includes CMV, EBV.  Possible false negative rapid strep, minimal exudate obtained.  Start azithromycin, symptomatic care with RTC precautions given.  Right groin suspect lymphadenopathy, reactive from hidradenitis.  Hidradenitis improving.  Bactroban topical discussed including if future outbreak, RTC precautions if lymphadenopathy not improving, or increasing in size.  Meds ordered this encounter  Medications   mupirocin cream (BACTROBAN) 2 %    Sig: Apply 1 application topically 2 (two) times daily. For up to 1 week to open wounds.    Dispense:  15 g    Refill:  0   azithromycin (ZITHROMAX) 250 MG tablet    Sig: Take 2 tablets on day 1, then 1 tablet daily on days 2 through 5    Dispense:  6 tablet    Refill:  0   Patient Instructions   Ok to apply the antibiotic ointment to hidradenitis wounds. Bump in groin is likely a reactive lymph node. I expect that to improve in next few weeks. Recheck if not resolving or sooner if enlarging/worsening.   Start azithromycin for possible strep throat. See info below. Sore throat lozenges, tylenol as needed for fever. Return to the clinic or go to the nearest emergency room if any of your symptoms worsen or new symptoms occur.  Strep Throat, Adult Strep throat is an infection in the  throat that is caused by bacteria. It is common during the cold months of the year. It mostly affects children who are 58-14 years old. However, people of all ages can get it at any time of the year. This infection spreads from person to person (is contagious) through coughing, sneezing, or having close contact. Your health care provider may use other names to describe the infection. When strep throat affects the tonsils, it is called tonsillitis. When it affects the back of the throat, it is called pharyngitis. What are the causes? This condition is caused by the Streptococcus pyogenes bacteria. What increases the risk? You are more likely to develop this condition if: You care for school-age children, or are around school-age children. Children are more likely to get strep throat and may spread it to others. You spend time in crowded places where the infection can spread easily. You have close contact with someone who has strep throat. What are the signs or symptoms? Symptoms of this condition include: Fever or chills. Redness, swelling, or pain in the tonsils or throat. Pain or difficulty when swallowing. White or yellow spots on the tonsils or throat. Tender glands in the neck and under the jaw. Bad smelling breath. Red rash all over the body. This is rare. How is this diagnosed? This condition is diagnosed by tests that check for the presence and the amount of bacteria that cause strep throat. They are: Rapid strep test. Your throat is swabbed and checked for the presence of bacteria. Results are usually ready in minutes. Throat culture test. Your throat is swabbed. The sample is placed in a cup that allows infections to grow. Results are usually ready in 1 or 2 days. How is this treated? This condition may be treated with: Medicines that kill germs (antibiotics). Medicines that relieve pain or fever. These include: Ibuprofen or acetaminophen. Aspirin, only for people who are over the  age of 30. Throat lozenges. Throat sprays. Follow these instructions at home: Medicines  Take over-the-counter and prescription medicines only as told by your health care provider. Take  your antibiotic medicine as told by your health care provider. Do not stop taking the antibiotic even if you start to feel better. Eating and drinking  If you have trouble swallowing, try eating soft foods until your sore throat feels better. Drink enough fluid to keep your urine pale yellow. To help relieve pain, you may have: Warm fluids, such as soup and tea. Cold fluids, such as frozen desserts or popsicles. General instructions Gargle with a salt-water mixture 3-4 times a day or as needed. To make a salt-water mixture, completely dissolve -1 tsp (3-6 g) of salt in 1 cup (237 mL) of warm water. Get plenty of rest. Stay home from work or school until you have been taking antibiotics for 24 hours. Do not use any products that contain nicotine or tobacco. These products include cigarettes, chewing tobacco, and vaping devices, such as e-cigarettes. If you need help quitting, ask your health care provider. It is up to you to get your test results. Ask your health care provider, or the department that is doing the test, when your results will be ready. Keep all follow-up visits. This is important. How is this prevented?  Do not share food, drinking cups, or personal items that could cause the infection to spread to other people. Wash your hands often with soap and water for at least 20 seconds. If soap and water are not available, use hand sanitizer. Make sure that all people in your house wash their hands well. Have family members tested if they have a sore throat or fever. They may need an antibiotic if they have strep throat. Contact a health care provider if: You have swelling in your neck that keeps getting bigger. You develop a rash, cough, or earache. You cough up a thick mucus that is green,  yellow-brown, or bloody. You have pain or discomfort that does not get better with medicine. Your symptoms seem to be getting worse. You have a fever. Get help right away if: You have new symptoms, such as vomiting, severe headache, stiff or painful neck, chest pain, or shortness of breath. You have severe throat pain, drooling, or changes in your voice. You have swelling of the neck, or the skin on the neck becomes red and tender. You have signs of dehydration, such as tiredness (fatigue), dry mouth, and decreased urination. You become increasingly sleepy, or you cannot wake up completely. Your joints become red or painful. These symptoms may represent a serious problem that is an emergency. Do not wait to see if the symptoms will go away. Get medical help right away. Call your local emergency services (911 in the [place] Do not drive yourself to the hospital. Summary Strep throat is an infection in the throat that is caused by the Streptococcus pyogenes bacteria. This infection is spread from person to person (is contagious) through coughing, sneezing, or having close contact. Take your medicines, including antibiotics, as told by your health care provider. Do not stop taking the antibiotic even if you start to feel better. To prevent the spread of germs, wash your hands well with soap and water. Have others do the same. Do not share food, drinking cups, or personal items. Get help right away if you have new symptoms, such as vomiting, severe headache, stiff or painful neck, chest pain, or shortness of breath. This information is not intended to replace advice given to you by your health care provider. Make sure you discuss any questions you have with your health care provider. Document  Revised: 08/19/2020 Document Reviewed: 08/19/2020 Elsevier Patient Education  2022 [employer].   If you have lab work done today you will be contacted with your lab results within the next 2 weeks.  If you  have not heard from [place] then please contact us. The fastest way to get your results is to register for My Chart.   IF you received an x-ray today, you will receive an invoice from [facility]. Please contact [facility] at [phone] with questions or concerns regarding your invoice.   IF you received labwork today, you will receive an invoice from [place]. Please contact [facility] at [phone] with questions or concerns regarding your invoice.   Our billing staff will not be able to assist you with questions regarding bills from these companies.  You will be contacted with the lab results as soon as they are available. The fastest way to get your results is to activate your My Chart account. Instructions are located on the last page of this paperwork. If you have not heard from [place] regarding the results in 2 weeks, please contact this office.        Signed,   Meredith Staggers, MD [facility], [facility] 04/27/21 9:40 AM

## 2021-04-28 ENCOUNTER — Other Ambulatory Visit: Payer: Self-pay | Admitting: Family Medicine

## 2021-04-28 DIAGNOSIS — L732 Hidradenitis suppurativa: Secondary | ICD-10-CM

## 2021-04-29 ENCOUNTER — Other Ambulatory Visit: Payer: Self-pay

## 2021-04-29 ENCOUNTER — Encounter: Payer: Self-pay | Admitting: Emergency Medicine

## 2021-04-29 ENCOUNTER — Ambulatory Visit (INDEPENDENT_AMBULATORY_CARE_PROVIDER_SITE_OTHER): Payer: BC Managed Care – PPO

## 2021-04-29 ENCOUNTER — Ambulatory Visit
Admission: EM | Admit: 2021-04-29 | Discharge: 2021-04-29 | Disposition: A | Discharge status: Home / Self Care | Payer: BC Managed Care – PPO | Attending: Family Medicine | Admitting: Family Medicine

## 2021-04-29 DIAGNOSIS — S66911A Strain of unspecified muscle, fascia and tendon at wrist and hand level, right hand, initial encounter: Secondary | ICD-10-CM | POA: Diagnosis not present

## 2021-04-29 DIAGNOSIS — M25531 Pain in right wrist: Secondary | ICD-10-CM

## 2021-04-29 DIAGNOSIS — M7989 Other specified soft tissue disorders: Secondary | ICD-10-CM | POA: Diagnosis not present

## 2021-04-29 MED ORDER — CYCLOBENZAPRINE HCL 5 MG PO TABS
5.0000 mg | ORAL_TABLET | Freq: Two times a day (BID) | ORAL | 0 refills | Status: DC | PRN
Start: 2021-04-29 — End: 2021-10-15

## 2021-04-29 MED ORDER — NAPROXEN 500 MG PO TABS: 500.0000 mg | ORAL_TABLET | Freq: Two times a day (BID) | ORAL | 0 refills | Status: DC | PRN

## 2021-04-29 NOTE — ED Provider Notes (Signed)
[facility]    CSN: [id number] Arrival date & time: 04/29/21  1024      History   Chief Complaint No chief complaint on file.   HPI Alexandra Collins is a 25 y.o. female.   Patient presenting today with 1 day history of right wrist pain after picking up her daughter.  She states that she heard a popping sound and immediately had pain and swelling to the right wrist.  Denies bruising, numbness, tingling, decreased range of motion though very painful to do so.  So far not tried anything over-the-counter for symptoms.  No past history of orthopedic injuries to this area.   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 Authorizing Provider  cyclobenzaprine (FLEXERIL) 5 MG tablet Take 1 tablet (5 mg total) by mouth 2 (two) times daily as needed for muscle spasms. 04/29/21  Yes Particia Nearing, PA-C  naproxen (NAPROSYN) 500 MG tablet Take 1 tablet (500 mg total) by mouth 2 (two) times daily as needed. 04/29/21  Yes Particia Nearing, PA-C  azithromycin [facility]) 250 MG tablet Take 2 tablets on day 1, then 1 tablet daily on days 2 through 5 04/27/21 05/02/21  Shade Flood, MD  ibuprofen (ADVIL) 800 MG tablet Take 1 tablet (800 mg total) by mouth 3 (three) times daily. Patient not taking: Reported on 04/27/2021 07/17/20   Mesner, Barbara Cower, MD  lidocaine  (LIDODERM) 5 % Place 1 patch onto the skin daily. Remove & Discard patch within 12 hours or as directed by MD Patient not taking: Reported on 04/27/2021 07/17/20   Mesner, Barbara Cower, MD  mupirocin cream (BACTROBAN) 2 % Apply 1 application topically 2 (two) times daily. For up to 1 week to open wounds. 04/27/21   Shade Flood, MD  mupirocin ointment (BACTROBAN) 2 % Apply 1 application topically 2 (two) times daily. For up to 1 week for open wounds. 04/27/21   Shade Flood, MD  cetirizine (ZYRTEC ALLERGY) 10 MG tablet Take 1 tablet (10 mg total) by mouth daily. Patient not taking: Reported on 03/12/2020 01/04/20 04/29/20  Hall-Potvin, [place], PA-C  fluticasone (FLONASE) 50 MCG/ACT nasal spray Place 1 spray into both nostrils daily. Patient not taking: Reported on 03/12/2020 01/04/20 04/29/20  Hall-Potvin, [place], PA-C    Family History Family History  Problem Relation Age of Onset   Hypertension Mother    Seizures Father    Hypertension Father     Social History Social History   Tobacco Use   Smoking status: Never   Smokeless tobacco: Never  Vaping  Use   Vaping Use: Never used  Substance Use Topics   Alcohol use: No   Drug use: No     Allergies   Ceclor [cefaclor], Elemental sulfur, Garlic, Penicillins, Shellfish allergy, Sulfamethoxazole, and Augmentin [amoxicillin-pot clavulanate]   Review of Systems Review of Systems Per HPI  Physical Exam Triage Vital Signs ED Triage Vitals  Enc Vitals Group     BP 04/29/21 1052 131/87     Pulse Rate 04/29/21 1052 (!) 109     Resp 04/29/21 1052 18     Temp 04/29/21 1052 98.5 F (36.9 C)     Temp Source 04/29/21 1052 Oral     SpO2 04/29/21 1052 97 %     Weight --      Height --      Head Circumference --      Peak Flow --      Pain Score 04/29/21 1053 4     Pain Loc --      Pain Edu? --      Excl. in GC? --    No data found.  Updated Vital Signs BP 131/87 (BP Location: Right Arm)    Pulse (!) 109    Temp 98.5 F  (36.9 C) (Oral)    Resp 18    LMP 04/14/2021 (Approximate)    SpO2 97%   Visual Acuity Right Eye Distance:   Left Eye Distance:   Bilateral Distance:    Right Eye Near:   Left Eye Near:    Bilateral Near:     Physical Exam Vitals and nursing note reviewed.  Constitutional:      Appearance: Normal appearance. She is not ill-appearing.  HENT:     Head: Atraumatic.  Eyes:     Extraocular Movements: Extraocular movements intact.     Conjunctiva/sclera: Conjunctivae normal.  Cardiovascular:     Rate and Rhythm: Normal rate and regular rhythm.     Heart sounds: Normal heart sounds.  Pulmonary:     Effort: Pulmonary effort is normal.     Breath sounds: Normal breath sounds.  Musculoskeletal:        General: Swelling, tenderness and signs of injury present. No deformity. Normal range of motion.     Cervical back: Normal range of motion and neck supple.     Comments: Trace edema right wrist diffusely.  Tender to palpation on the ulnar aspect.  No bony deformity palpable.  Range of motion full and intact though painful per patient.  Skin:    General: Skin is warm and dry.  Neurological:     Mental Status: She is alert and oriented to person, place, and time.     Motor: No weakness.     Gait: Gait normal.  Psychiatric:        Mood and Affect: Mood normal.        Thought Content: Thought content normal.        Judgment: Judgment normal.     UC Treatments / Results  Labs (all labs ordered are listed, but only abnormal results are displayed) Labs Reviewed - No data to display  EKG  Radiology DG Wrist Complete Right  Result Date: 04/29/2021 CLINICAL DATA:  Swelling and pain post lifting injury EXAM: RIGHT WRIST - COMPLETE 3+ VIEW COMPARISON:  None. FINDINGS: There is no evidence of fracture or dislocation. There is no evidence of arthropathy or other focal bone abnormality. Soft tissues are unremarkable. IMPRESSION: Negative. Electronically Signed   By: Ronald Pippins.D.  On:  04/29/2021 11:08    Procedures Procedures (including critical care time)  Medications Ordered in UC Medications - No data to display  Initial Impression / Assessment and Plan / UC Course  I have reviewed the triage vital signs and the nursing notes.  Pertinent labs & imaging results that were available during my care of the patient were reviewed by me and considered in my medical decision making (see chart for details).     X-ray negative for acute bony injury to the right wrist, suspect a strain causing her symptoms.  Naproxen, Flexeril, RICE protocol reviewed.  Return for acutely worsening symptoms.  Final Clinical Impressions(s) / UC Diagnoses   Final diagnoses:  Strain of right wrist, initial encounter   Discharge Instructions   None    ED Prescriptions     Medication Sig Dispense Auth. Provider   naproxen (NAPROSYN) 500 MG tablet Take 1 tablet (500 mg total) by mouth 2 (two) times daily as needed. 30 tablet Particia Nearing, [place]   cyclobenzaprine (FLEXERIL) 5 MG tablet Take 1 tablet (5 mg total) by mouth 2 (two) times daily as needed for muscle spasms. 10 tablet Particia Nearing, [place]      PDMP not reviewed this encounter.   Particia Nearing, [place] 04/29/21 1129

## 2021-04-29 NOTE — ED Triage Notes (Signed)
After picking daughter up yesterday, she heard a crack in her wrist.  Woke up this morning with right wrist swollen and hurts to move it

## 2021-05-10 DIAGNOSIS — Z419 Encounter for procedure for purposes other than remedying health state, unspecified: Secondary | ICD-10-CM | POA: Diagnosis not present

## 2021-05-29 NOTE — Progress Notes (Signed)
Established Patient Office Visit  Subjective:  Patient ID: Alexandra Collins, female    DOB: 05/11/1995  Age: 26 y.o. MRN: [id number]  CC:  Chief Complaint  Patient presents with   Abdominal Pain    Patient states she is here for abdominal pain that she has been experiencing since lifting a little baby that was heavy. Per patient she would also like form filled out and needs a TB test.    HPI Alexandra Collins presents for abdominal pain and TB screen  Abdominal pain Onset a few weeks ago. 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 Aching pain since. Worse in certain positions. No other agg/allev factors. No gi symptoms. No chest pain. No joint pain.  TB screening Needs done for work No symptoms, no hx bcg vaccine, no known exposure.  Past Medical History:  Diagnosis Date   Anemia    Hydradenitis     Past Surgical History:  Procedure Laterality Date   KNEE ARTHROSCOPY Right     Family History  Problem Relation Age of Onset   Hypertension Mother    Seizures Father    Hypertension Father     Social History   Socioeconomic History   Marital status: Married    Spouse name: Not on file   Number of children: Not on file   Years of education: Not on file   Highest education level: Not on file  Occupational History   Not on file  Tobacco Use   Smoking status: Never   Smokeless tobacco: Never  Vaping Use   Vaping Use: Never used  Substance and Sexual Activity   Alcohol use: No   Drug use: No   Sexual activity: Yes    Birth control/protection: Implant  Other Topics Concern   Not on file  Social History Narrative   Not on file   Social Determinants of Health   Financial Resource Strain: Not on file  Food Insecurity: Not on file  Transportation Needs: Not on file  Physical Activity: Not on file  Stress: Not on file  Social Connections: Not on file  Intimate Partner Violence: Not on file    Outpatient Medications Prior to Visit  Medication Sig  Dispense Refill   ibuprofen (ADVIL) 600 MG tablet Take 1 tablet (600 mg total) by mouth every 6 (six) hours as needed. (Patient not taking: Reported on 06/23/2020) 20 tablet 0   meloxicam (MOBIC) 7.5 MG tablet Take 1 tablet (7.5 mg total) by mouth daily. 30 tablet 0   metaxalone (SKELAXIN) 800 MG tablet Take 1 tablet (800 mg total) by mouth 3 (three) times daily. 21 tablet 0   methocarbamol (ROBAXIN) 500 MG tablet Take 1 tablet (500 mg total) by mouth every 8 (eight) hours as needed for muscle spasms. 60 tablet 0   No facility-administered medications prior to visit.    Allergies  Allergen Reactions   Ceclor [Cefaclor] Hives   Elemental Sulfur Hives   Garlic Hives    Only allergic to raw garlic   Penicillins Hives    Has patient had a PCN reaction causing immediate rash, facial/tongue/throat swelling, SOB or lightheadedness with hypotension: Yes Has patient had a PCN reaction causing severe rash involving mucus membranes or skin necrosis: Yes Has patient had a PCN reaction that required hospitalization: No Has patient had a PCN reaction occurring within the last 10 years: No If all of the above answers are "NO", then may proceed with Cephalosporin use.    Shellfish Allergy Itching  Sulfamethoxazole Nausea And Vomiting   Augmentin [Amoxicillin-Pot Clavulanate] Rash    ROS Review of Systems  Constitutional: Negative.   HENT: Negative.    Eyes: Negative.   Respiratory: Negative.    Cardiovascular: Negative.   Gastrointestinal: Negative.   Genitourinary: Negative.   Musculoskeletal: Negative.   Skin: Negative.   Neurological: Negative.   Psychiatric/Behavioral: Negative.    All other systems reviewed and are negative.    Objective:    Physical Exam Vitals and nursing note reviewed.  Constitutional:      General: She is not in acute distress.    Appearance: Normal appearance. She is normal weight. She is not ill-appearing, toxic-appearing or diaphoretic.  Cardiovascular:      Rate and Rhythm: Normal rate and regular rhythm.     Heart sounds: Normal heart sounds. No murmur heard.   No friction rub. No gallop.  Pulmonary:     Effort: Pulmonary effort is normal. No respiratory distress.     Breath sounds: Normal breath sounds. No stridor. No wheezing, rhonchi or rales.  Chest:     Chest wall: No tenderness.  Musculoskeletal:     Comments: Msk exam unremarkable  Skin:    General: Skin is warm and dry.  Neurological:     General: No focal deficit present.     Mental Status: She is alert and oriented to person, place, and time. Mental status is at baseline.  Psychiatric:        Mood and Affect: Mood normal.        Behavior: Behavior normal.        Thought Content: Thought content normal.        Judgment: Judgment normal.    BP 127/72    Pulse 90    Temp 98 F (36.7 C) (Temporal)    Resp 18    Ht 5' 6.5" (1.689 m)    Wt (!) 316 lb (143.3 kg)    SpO2 100%    BMI 50.24 kg/m  Wt Readings from Last 3 Encounters:  04/27/21 284 lb 6.4 oz (129 kg)  07/16/20 (!) 312 lb (141.5 kg)  06/23/20 (!) 316 lb (143.3 kg)     Health Maintenance Due  Topic Date Due   Hepatitis C Screening  Never done   COVID-19 Vaccine (3 - Booster for Moderna series) 11/20/2019    There are no preventive care reminders to display for this patient.  Lab Results  Component Value Date   TSH 1.790 11/09/2019   Lab Results  Component Value Date   WBC 3.4 11/09/2019   HGB 11.7 11/09/2019   HCT 35.3 11/09/2019   MCV 84 11/09/2019   PLT 452 (H) 11/09/2019   Lab Results  Component Value Date   NA 139 08/01/2019   K 3.4 (L) 08/01/2019   CO2 23 08/01/2019   GLUCOSE 97 08/01/2019   BUN <5 (L) 08/01/2019   CREATININE 0.82 08/01/2019   BILITOT 0.4 08/01/2019   ALKPHOS 140 (H) 08/01/2019   AST 27 08/01/2019   ALT 25 08/01/2019   PROT 6.3 (L) 08/01/2019   ALBUMIN 2.8 (L) 08/01/2019   CALCIUM 8.1 (L) 08/01/2019   ANIONGAP 10 08/01/2019   No results found for: CHOL No  results found for: HDL No results found for: LDLCALC No results found for: TRIG No results found for: CHOLHDL No results found for: PQZR0Q    Assessment & Plan:   Problem List Items Addressed This Visit   None Visit Diagnoses  Screening for tuberculosis    -  Primary   Relevant Orders   TB Skin Test (Completed)   Pain in joint involving left pelvic region and thigh           Meds ordered this encounter  Medications   DISCONTD: ibuprofen (ADVIL) 600 MG tablet    Sig: Take 1 tablet (600 mg total) by mouth every 8 (eight) hours as needed.    Dispense:  30 tablet    Refill:  0    Order Specific Question:   Supervising Provider    Answer:   Neva Seat, JEFFREY R [[id number]]    Follow-up: No follow-ups on file.   PLAN Likely muscle strain. Recommend stretching twice daily and ibuprofen 600mg  po q6h ac prn. Tb skin test placed. Return in 48 hours for read. Patient encouraged to call clinic with any questions, comments, or concerns.  , NP

## 2021-06-10 DIAGNOSIS — Z419 Encounter for procedure for purposes other than remedying health state, unspecified: Secondary | ICD-10-CM | POA: Diagnosis not present

## 2021-07-08 ENCOUNTER — Emergency Department (HOSPITAL_COMMUNITY)
Admission: EM | Admit: 2021-07-08 | Discharge: 2021-07-08 | Disposition: A | Discharge status: Home / Self Care | Payer: BC Managed Care – PPO | Attending: Emergency Medicine | Admitting: Emergency Medicine

## 2021-07-08 ENCOUNTER — Ambulatory Visit: Payer: BC Managed Care – PPO | Admitting: Family Medicine

## 2021-07-08 ENCOUNTER — Other Ambulatory Visit: Payer: Self-pay

## 2021-07-08 DIAGNOSIS — R110 Nausea: Secondary | ICD-10-CM

## 2021-07-08 DIAGNOSIS — Z3046 Encounter for surveillance of implantable subdermal contraceptive: Secondary | ICD-10-CM | POA: Diagnosis not present

## 2021-07-08 DIAGNOSIS — R109 Unspecified abdominal pain: Secondary | ICD-10-CM | POA: Insufficient documentation

## 2021-07-08 DIAGNOSIS — Z419 Encounter for procedure for purposes other than remedying health state, unspecified: Secondary | ICD-10-CM | POA: Diagnosis not present

## 2021-07-08 LAB — PREGNANCY, URINE: Preg Test, Ur: NEGATIVE

## 2021-07-08 MED ORDER — POVIDONE-IODINE 10 % EX SOLN
CUTANEOUS | Status: AC
  Filled 2021-07-08: qty 29.6

## 2021-07-08 MED ORDER — LIDOCAINE HCL (PF) 1 % IJ SOLN
5.0000 mL | Freq: Once | INTRAMUSCULAR | Status: AC
  Filled 2021-07-08: qty 5

## 2021-07-08 MED ORDER — LIDOCAINE HCL (PF) 1 % IJ SOLN
INTRAMUSCULAR | Status: AC
  Administered 2021-07-08: 5 mL
  Filled 2021-07-08: qty 5

## 2021-07-08 MED ORDER — ONDANSETRON 4 MG PO TBDP
4.0000 mg | ORAL_TABLET | Freq: Once | ORAL | Status: AC
  Administered 2021-07-08: 4 mg via ORAL
  Filled 2021-07-08: qty 1

## 2021-07-08 MED ORDER — ONDANSETRON HCL 4 MG PO TABS: 4.0000 mg | ORAL_TABLET | Freq: Four times a day (QID) | ORAL | 0 refills | Status: DC

## 2021-07-08 NOTE — ED Provider Notes (Signed)
?[facility] ?Provider Note ? ? ?CSN: [id number] ?Arrival date & time: 07/08/21  1426 ? ?  ? ?History ? ?Chief Complaint  ?Patient presents with  ? Birth Control Removal  ? Nausea  ? ? ?Alexandra Collins is a 26 y.o. female. ? ?HPI ? ?Patient is a 26 year old female presenting with a request to have her implantable birth control taken out of her right arm.  She has had this over time and over the last 2 years has had the same implant but states that recently has had some abdominal cramping nausea and feels like she wants to get pregnant again.  She wants to be checked to make sure she is not pregnant, she wants me to take out the implant, she tried to go to her OB/GYN but was not able to be seen today.   ? ?Home Medications ?Prior to Admission medications   ?Medication Sig Start Date End Date Taking? Authorizing Provider  ?ondansetron (ZOFRAN) 4 MG tablet Take 1 tablet (4 mg total) by mouth every 6 (six) hours. 07/08/21  Yes Eber Hong, MD  ?cyclobenzaprine (FLEXERIL) 5 MG tablet Take 1 tablet (5 mg total) by mouth 2 (two) times daily as needed for muscle spasms. ?Patient not taking: Reported on 07/08/2021 04/29/21   Particia Nearing, PA-C  ?ibuprofen (ADVIL) 800 MG tablet Take 1 tablet (800 mg total) by mouth 3 (three) times daily. ?Patient not taking: Reported on 04/27/2021 07/17/20   Mesner, Barbara Cower, MD  ?lidocaine (LIDODERM) 5 % Place 1 patch onto the skin daily. Remove & Discard patch within 12 hours or as directed by MD ?Patient not taking: Reported on 04/27/2021 07/17/20   Mesner, Barbara Cower, MD  ?mupirocin cream (BACTROBAN) 2 % Apply 1 application topically 2 (two) times daily. For up to 1 week to open wounds. ?Patient not taking: Reported on 07/08/2021 04/27/21   Shade Flood, MD  ?mupirocin ointment (BACTROBAN) 2 % Apply 1 application topically 2 (two) times daily. For up to 1 week for open wounds. ?Patient not taking: Reported on 07/08/2021 04/27/21   Shade Flood, MD  ?naproxen (NAPROSYN)  500 MG tablet Take 1 tablet (500 mg total) by mouth 2 (two) times daily as needed. ?Patient not taking: Reported on 07/08/2021 04/29/21   Particia Nearing, PA-C  ?cetirizine (ZYRTEC ALLERGY) 10 MG tablet Take 1 tablet (10 mg total) by mouth daily. ?Patient not taking: Reported on 03/12/2020 01/04/20 04/29/20  Hall-Potvin, [place], PA-C  ?fluticasone (FLONASE) 50 MCG/ACT nasal spray Place 1 spray into both nostrils daily. ?Patient not taking: Reported on 03/12/2020 01/04/20 04/29/20  Hall-Potvin, [place], PA-C  ?   ? ?Allergies    ?Ceclor [cefaclor], Elemental sulfur, Garlic, Penicillins, Shellfish allergy, Sulfamethoxazole, and Augmentin [amoxicillin-pot clavulanate]   ? ?Review of Systems   ?Review of Systems ? ?Physical Exam ?Updated Vital Signs ?BP (!) 157/107   Pulse 81   Temp 97.7 ?F (36.5 ?C) (Oral)   Resp 20   LMP  (LMP Unknown)   SpO2 100%  ?Physical Exam ?Vitals and nursing note reviewed.  ?Constitutional:   ?   Appearance: She is well-developed. She is not diaphoretic.  ?HENT:  ?   Head: Normocephalic and atraumatic.  ?Eyes:  ?   General:     ?   Right eye: No discharge.     ?   Left eye: No discharge.  ?   Conjunctiva/sclera: Conjunctivae normal.  ?Pulmonary:  ?   Effort: Pulmonary effort is normal. No respiratory distress.  ?  Skin: ?   General: Skin is warm and dry.  ?   Findings: No erythema or rash.  ?Neurological:  ?   Mental Status: She is alert.  ?   Coordination: Coordination normal.  ? ? ?ED Results / Procedures / Treatments   ?Labs ?(all labs ordered are listed, but only abnormal results are displayed) ?Labs Reviewed  ?PREGNANCY, URINE  ? ? ?EKG ?None ? ?Radiology ?No results found. ? ?Procedures ?Marland KitchenForeign Body Removal ? ?Date/Time: 07/08/2021 3:21 PM ?Performed by: Eber Hong, MD ?Authorized by: Eber Hong, MD  ?Consent: Verbal consent obtained. ?Risks and benefits: risks, benefits and alternatives were discussed ?Consent given by: patient ?Patient understanding: patient states  understanding of the procedure being performed ?Procedure consent: procedure consent matches procedure scheduled ?Relevant documents: relevant documents present and verified ?Test results: test results available and properly labeled ?Site marked: the operative site was marked ?Required items: required blood products, implants, devices, and special equipment available ?Patient identity confirmed: verbally with patient ?Time out: Immediately prior to procedure a "time out" was called to verify the correct patient, procedure, equipment, support staff and site/side marked as required. ?Body area: skin ?General location: upper extremity ?Location details: right upper arm ?Anesthesia: local infiltration ? ?Anesthesia: ?Local Anesthetic: lidocaine 1% without epinephrine ?Anesthetic total: 3 mL ? ?Sedation: ?Patient sedated: no ? ?Patient restrained: no ?Patient cooperative: yes ?Localization method: probed ?Removal mechanism: forceps ?Dressing: dressing applied ?Tendon involvement: none ?Depth: subcutaneous ?Complexity: simple ?1 objects recovered. ?Objects recovered: birth control implant ?Post-procedure assessment: foreign body removed ?Patient tolerance: patient tolerated the procedure well with no immediate complications ?Comments:  ? ?  ? ? ?Medications Ordered in ED ?Medications  ?lidocaine (PF) (XYLOCAINE) 1 % injection 5 mL (has no administration in time range)  ?povidone-iodine (BETADINE) 10 % external solution (has no administration in time range)  ?lidocaine (PF) (XYLOCAINE) 1 % injection (has no administration in time range)  ?ondansetron (ZOFRAN-ODT) disintegrating tablet 4 mg (4 mg Oral Given 07/08/21 1440)  ? ? ?ED Course/ Medical Decision Making/ A&P ?  ?                        ?Medical Decision Making ?Amount and/or Complexity of Data Reviewed ?Labs: ordered. ? ?Risk ?Prescription drug management. ? ? ?We will make sure the patient is not pregnant, we will remove the implant, patient agreeable to the plan  including the incision and removal of this implant ? ?Implant successfully removed, discharge pending pregnancy test. ? ? ? ? ? ? ? ?Final Clinical Impression(s) / ED Diagnoses ?Final diagnoses:  ?Nausea  ? ? ?Rx / DC Orders ?ED Discharge Orders   ? ?      Ordered  ?  ondansetron (ZOFRAN) 4 MG tablet  Every 6 hours       ? 07/08/21 1523  ? ?  ?  ? ?  ? ? ?  ?Eber Hong, MD ?07/08/21 1552 ? ?

## 2021-07-08 NOTE — Discharge Instructions (Signed)
Zofran as needed for nausea ?See your gynecologist for further guidance on pregnancy related issues. ? ?ER for worsening symptoms. ?

## 2021-07-08 NOTE — ED Triage Notes (Signed)
Pt requests to have Birth Control Implant removed due to symptoms she is having. Dr. Hyacinth Meeker to triage to evaluate patient. ?

## 2021-08-08 DIAGNOSIS — Z419 Encounter for procedure for purposes other than remedying health state, unspecified: Secondary | ICD-10-CM | POA: Diagnosis not present

## 2021-09-07 ENCOUNTER — Ambulatory Visit: Payer: BC Managed Care – PPO | Admitting: Obstetrics and Gynecology

## 2021-09-07 DIAGNOSIS — Z419 Encounter for procedure for purposes other than remedying health state, unspecified: Secondary | ICD-10-CM | POA: Diagnosis not present

## 2021-09-27 ENCOUNTER — Other Ambulatory Visit: Payer: Self-pay

## 2021-09-27 ENCOUNTER — Ambulatory Visit
Admission: EM | Admit: 2021-09-27 | Discharge: 2021-09-27 | Disposition: A | Discharge status: Home / Self Care | Payer: BC Managed Care – PPO | Attending: Family Medicine | Admitting: Family Medicine

## 2021-09-27 ENCOUNTER — Encounter: Payer: Self-pay | Admitting: Emergency Medicine

## 2021-09-27 DIAGNOSIS — H1032 Unspecified acute conjunctivitis, left eye: Secondary | ICD-10-CM | POA: Diagnosis not present

## 2021-09-27 MED ORDER — OFLOXACIN 0.3 % OP SOLN: 1.0000 [drp] | Freq: Four times a day (QID) | OPHTHALMIC | 0 refills | Status: DC

## 2021-09-27 NOTE — ED Provider Notes (Signed)
[facility]    CSN: [id number] Arrival date & time: 09/27/21  1041      History   Chief Complaint Chief Complaint  Patient presents with   Conjunctivitis    Entered by patient    HPI Alexandra Collins is a 26 y.o. female.   Presenting today with 3-day history of left eye redness, drainage, irritation, itching.  Has tried over-the-counter red eyedrops, warm compresses with no relief.  Denies fever, chills, vision change, headache, nausea, vomiting.  No injury to the eye or exposure to chemicals.  Recent exposure to pinkeye.   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 Authorizing Provider  ofloxacin (OCUFLOX) 0.3 % ophthalmic solution Place 1 drop into the left eye 4 (four) times daily. 09/27/21  Yes Particia Nearing, PA-C  cyclobenzaprine (FLEXERIL) 5 MG tablet Take 1 tablet (5 mg total) by mouth 2 (two) times daily as needed for muscle spasms. Patient not taking: Reported on 07/08/2021 04/29/21   Particia Nearing, PA-C  ibuprofen (ADVIL) 800 MG tablet Take 1 tablet (800 mg total) by mouth 3 (three) times daily. Patient not taking: Reported on 04/27/2021 07/17/20   Mesner, Barbara Cower, MD  lidocaine (LIDODERM) 5 % Place 1 patch onto the skin daily. Remove & Discard patch within 12 hours or as directed by MD Patient not taking: Reported on 04/27/2021 07/17/20    Mesner, Barbara Cower, MD  mupirocin cream (BACTROBAN) 2 % Apply 1 application topically 2 (two) times daily. For up to 1 week to open wounds. Patient not taking: Reported on 07/08/2021 04/27/21   Shade Flood, MD  mupirocin ointment (BACTROBAN) 2 % Apply 1 application topically 2 (two) times daily. For up to 1 week for open wounds. Patient not taking: Reported on 07/08/2021 04/27/21   Shade Flood, MD  naproxen (NAPROSYN) 500 MG tablet Take 1 tablet (500 mg total) by mouth 2 (two) times daily as needed. Patient not taking: Reported on 07/08/2021 04/29/21   Particia Nearing, PA-C  ondansetron (ZOFRAN) 4 MG tablet Take 1 tablet (4 mg total) by mouth every 6 (six) hours. 07/08/21   Eber Hong, MD  cetirizine (ZYRTEC ALLERGY) 10 MG tablet Take 1 tablet (10 mg total) by mouth daily. Patient not taking: Reported on 03/12/2020 01/04/20 04/29/20  Hall-Potvin, [place], PA-C  fluticasone (FLONASE) 50 MCG/ACT nasal spray Place 1 spray into both nostrils daily. Patient not taking: Reported on 03/12/2020 01/04/20 04/29/20  Hall-Potvin, [place], PA-C    Family History Family History  Problem Relation Age of Onset   Hypertension Mother  Seizures Father    Hypertension Father     Social History Social History   Tobacco Use   Smoking status: Never   Smokeless tobacco: Never  Vaping Use   Vaping Use: Never used  Substance Use Topics   Alcohol use: No   Drug use: No     Allergies   Ceclor [cefaclor], Elemental sulfur, Garlic, Penicillins, Shellfish allergy, Sulfamethoxazole, and Augmentin [amoxicillin-pot clavulanate]   Review of Systems Review of Systems Per HPI  Physical Exam Triage Vital Signs ED Triage Vitals  Enc Vitals Group     BP 09/27/21 1110 126/87     Pulse Rate 09/27/21 1110 74     Resp 09/27/21 1110 18     Temp 09/27/21 1110 98.8 F (37.1 C)     Temp Source 09/27/21 1110 Oral     SpO2 09/27/21 1110 95 %     Weight 09/27/21 1111 279 lb (126.6 kg)     Height  09/27/21 1111 5\' 6"  (1.676 m)     Head Circumference --      Peak Flow --      Pain Score 09/27/21 1110 8     Pain Loc --      Pain Edu? --      Excl. in GC? --    No data found.  Updated Vital Signs BP 126/87 (BP Location: Right Arm)   Pulse 74   Temp 98.8 F (37.1 C) (Oral)   Resp 18   Ht 5\' 6"  (1.676 m)   Wt 279 lb (126.6 kg)   LMP 09/13/2021 (Approximate)   SpO2 95%   BMI 45.03 kg/m   Visual Acuity Right Eye Distance:   Left Eye Distance:   Bilateral Distance:    Right Eye Near:   Left Eye Near:    Bilateral Near:     Physical Exam Vitals and nursing note reviewed.  Constitutional:      Appearance: Normal appearance. She is not ill-appearing.  HENT:     Head: Atraumatic.     Nose: Nose normal.     Mouth/Throat:     Mouth: Mucous membranes are moist.  Eyes:     Extraocular Movements: Extraocular movements intact.     Pupils: Pupils are equal, round, and reactive to light.     Comments: Left conjunctival erythema, injection, drainage  Cardiovascular:     Rate and Rhythm: Normal rate and regular rhythm.     Heart sounds: Normal heart sounds.  Pulmonary:     Effort: Pulmonary effort is normal.     Breath sounds: Normal breath sounds.  Musculoskeletal:        General: Normal range of motion.     Cervical back: Normal range of motion and neck supple.  Skin:    General: Skin is warm and dry.  Neurological:     Mental Status: She is alert and oriented to person, place, and time.  Psychiatric:        Mood and Affect: Mood normal.        Thought Content: Thought content normal.        Judgment: Judgment normal.     UC Treatments / Results  Labs (all labs ordered are listed, but only abnormal results are displayed) Labs Reviewed - No data to display  EKG   Radiology No results found.  Procedures Procedures (including critical care time)  Medications Ordered in UC Medications - No data to display  Initial Impression / Assessment and Plan / UC  Course  I have reviewed the triage vital signs and the nursing notes.  Pertinent labs & imaging results that were available during my care of the patient were reviewed by me and considered in my medical decision making (see chart for details).     Treat with Ocuflox drops, warm compresses, good handwashing.  Work note given.  Return for worsening symptoms.  Final Clinical Impressions(s) / UC Diagnoses   Final diagnoses:  Acute bacterial conjunctivitis of left eye   Discharge Instructions   None    ED Prescriptions     Medication Sig Dispense Auth. Provider   ofloxacin (OCUFLOX) 0.3 % ophthalmic solution Place 1 drop into the left eye 4 (four) times daily. 5 mL Particia NearingLane, Shelma Eiben [place], [place]-C      PDMP not reviewed this encounter.   Particia NearingLane, Floria Brandau [place], [place]C 09/27/21 1214

## 2021-09-27 NOTE — ED Triage Notes (Signed)
Pt reports left eye redness, drainage, pain x3 days. Has tried otc drops and warm compresses with minimal change in symptoms. Denies any known injury and reports has had exposure to similar.

## 2021-10-08 DIAGNOSIS — Z419 Encounter for procedure for purposes other than remedying health state, unspecified: Secondary | ICD-10-CM | POA: Diagnosis not present

## 2021-10-15 ENCOUNTER — Encounter (HOSPITAL_COMMUNITY): Payer: Self-pay | Admitting: Obstetrics and Gynecology

## 2021-10-15 ENCOUNTER — Other Ambulatory Visit: Payer: Self-pay

## 2021-10-15 ENCOUNTER — Ambulatory Visit (INDEPENDENT_AMBULATORY_CARE_PROVIDER_SITE_OTHER)
Admission: EM | Admit: 2021-10-15 | Discharge: 2021-10-15 | Disposition: A | Discharge status: Home / Self Care | Payer: BC Managed Care – PPO | Source: Home / Self Care

## 2021-10-15 ENCOUNTER — Inpatient Hospital Stay (HOSPITAL_COMMUNITY)
Admission: AD | Admit: 2021-10-15 | Discharge: 2021-10-15 | Disposition: A | Discharge status: Home / Self Care | Payer: BC Managed Care – PPO | Attending: Obstetrics and Gynecology | Admitting: Obstetrics and Gynecology

## 2021-10-15 DIAGNOSIS — O26891 Other specified pregnancy related conditions, first trimester: Secondary | ICD-10-CM | POA: Diagnosis not present

## 2021-10-15 DIAGNOSIS — O26899 Other specified pregnancy related conditions, unspecified trimester: Secondary | ICD-10-CM

## 2021-10-15 DIAGNOSIS — N898 Other specified noninflammatory disorders of vagina: Secondary | ICD-10-CM

## 2021-10-15 DIAGNOSIS — R109 Unspecified abdominal pain: Secondary | ICD-10-CM | POA: Insufficient documentation

## 2021-10-15 DIAGNOSIS — R1030 Lower abdominal pain, unspecified: Secondary | ICD-10-CM | POA: Diagnosis not present

## 2021-10-15 DIAGNOSIS — Z3201 Encounter for pregnancy test, result positive: Secondary | ICD-10-CM | POA: Insufficient documentation

## 2021-10-15 DIAGNOSIS — O209 Hemorrhage in early pregnancy, unspecified: Secondary | ICD-10-CM

## 2021-10-15 DIAGNOSIS — Z113 Encounter for screening for infections with a predominantly sexual mode of transmission: Secondary | ICD-10-CM | POA: Insufficient documentation

## 2021-10-15 DIAGNOSIS — N939 Abnormal uterine and vaginal bleeding, unspecified: Secondary | ICD-10-CM

## 2021-10-15 LAB — POCT URINE PREGNANCY: Preg Test, Ur: POSITIVE — AB

## 2021-10-15 NOTE — ED Provider Notes (Signed)
[facility]    CSN: [id number] Arrival date & time: 10/15/21  1501      History   Chief Complaint No chief complaint on file.   HPI Alexandra Collins is a 26 y.o. female.   Patient presents requesting pregnancy test.  She reports that she took a positive pregnancy test yesterday at home.  She has been trying to conceive as she had her Nexplanon implant removed in March.  She reports that she has been having irregular menstrual cycles since having Nexplanon removed.  Her last known menstrual cycle was at the beginning of May and only lasted 2 days which is abnormal for her.  Patient reports that she started having some abdominal cramping in the lower part of her abdomen last week.  She reports that it was severe abdominal pain and she had subsequent vaginal spotting.  Abdominal cramping has improved but is still present and is intermittent.  Also having some nausea but denies vomiting or breast tenderness.  Patient reports that she is also having some vaginal itching and some yellow vaginal discharge that started a few days ago.  Denies urinary burning, urinary frequency, back pain, fever.  Denies any concern for STD but is open to STD testing.     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 Authorizing Provider  cyclobenzaprine (FLEXERIL) 5 MG tablet Take 1 tablet (5 mg  total) by mouth 2 (two) times daily as needed for muscle spasms. Patient not taking: Reported on 07/08/2021 04/29/21   Volney American, PA-C  ibuprofen (ADVIL) 800 MG tablet Take 1 tablet (800 mg total) by mouth 3 (three) times daily. Patient not taking: Reported on 04/27/2021 07/17/20   Mesner, Corene Cornea, MD  lidocaine (LIDODERM) 5 % Place 1 patch onto the skin daily. Remove & Discard patch within 12 hours or as directed by MD Patient not taking: Reported on 04/27/2021 07/17/20   Mesner, Corene Cornea, MD  mupirocin cream (BACTROBAN) 2 % Apply 1 application topically 2 (two) times daily. For up to 1 week to open wounds. Patient not taking: Reported on 07/08/2021 04/27/21   Wendie Agreste, MD  mupirocin ointment (BACTROBAN) 2 % Apply 1 application topically 2 (two) times daily. For up to 1 week for open wounds. Patient not taking: Reported on 07/08/2021 04/27/21   Wendie Agreste, MD  naproxen (NAPROSYN) 500 MG tablet Take 1 tablet (500 mg total) by mouth 2 (two) times daily as needed. Patient not taking: Reported on 07/08/2021 04/29/21   Volney American, PA-C  ofloxacin (OCUFLOX) 0.3 % ophthalmic  solution Place 1 drop into the left eye 4 (four) times daily. 09/27/21   Particia Nearing, PA-C  ondansetron (ZOFRAN) 4 MG tablet Take 1 tablet (4 mg total) by mouth every 6 (six) hours. 07/08/21   Eber Hong, MD  cetirizine (ZYRTEC ALLERGY) 10 MG tablet Take 1 tablet (10 mg total) by mouth daily. Patient not taking: Reported on 03/12/2020 01/04/20 04/29/20  Hall-Potvin, [place], PA-C  fluticasone (FLONASE) 50 MCG/ACT nasal spray Place 1 spray into both nostrils daily. Patient not taking: Reported on 03/12/2020 01/04/20 04/29/20  Hall-Potvin, [place], PA-C    Family History Family History  Problem Relation Age of Onset   Hypertension Mother    Seizures Father    Hypertension Father     Social History Social History   Tobacco Use   Smoking status: Never   Smokeless tobacco: Never  Vaping  Use   Vaping Use: Never used  Substance Use Topics   Alcohol use: No   Drug use: No     Allergies   Ceclor [cefaclor], Elemental sulfur, Garlic, Penicillins, Shellfish allergy, Sulfamethoxazole, and Augmentin [amoxicillin-pot clavulanate]   Review of Systems Review of Systems Per HPI  Physical Exam Triage Vital Signs ED Triage Vitals  Enc Vitals Group     BP 10/15/21 1534 120/69     Pulse Rate 10/15/21 1534 74     Resp 10/15/21 1534 18     Temp 10/15/21 1534 98.1 F (36.7 C)     Temp Source 10/15/21 1534 Oral     SpO2 10/15/21 1534 98 %     Weight --      Height --      Head Circumference --      Peak Flow --      Pain Score 10/15/21 1531 3     Pain Loc --      Pain Edu? --      Excl. in GC? --    No data found.  Updated Vital Signs BP 120/69 (BP Location: Left Arm)   Pulse 74   Temp 98.1 F (36.7 C) (Oral)   Resp 18   LMP 09/13/2021 (Approximate)   SpO2 98%   Visual Acuity Right Eye Distance:   Left Eye Distance:   Bilateral Distance:    Right Eye Near:   Left Eye Near:    Bilateral Near:     Physical Exam Constitutional:      General: She is not in acute distress.    Appearance: Normal appearance. She is not toxic-appearing or diaphoretic.  HENT:     Head: Normocephalic and atraumatic.  Eyes:     Extraocular Movements: Extraocular movements intact.     Conjunctiva/sclera: Conjunctivae normal.  Cardiovascular:     Rate and Rhythm: Normal rate and regular rhythm.     Pulses: Normal pulses.     Heart sounds: Normal heart sounds.  Pulmonary:     Effort: Pulmonary effort is normal. No respiratory distress.     Breath sounds: Normal breath sounds.  Genitourinary:    Comments: Deferred with shared decision making.  Self swab performed. Neurological:     General: No focal deficit present.     Mental Status: She is alert and oriented to person, place, and time. Mental status is at baseline.  Psychiatric:        Mood and Affect: Mood normal.         Behavior: Behavior normal.        Thought Content: Thought content normal.  Judgment: Judgment normal.      UC Treatments / Results  Labs (all labs ordered are listed, but only abnormal results are displayed) Labs Reviewed  POCT URINE PREGNANCY - Abnormal; Notable for the following components:      Result Value   Preg Test, Ur Positive (*)    All other components within normal limits  CERVICOVAGINAL ANCILLARY ONLY    EKG   Radiology No results found.  Procedures Procedures (including critical care time)  Medications Ordered in UC Medications - No data to display  Initial Impression / Assessment and Plan / UC Course  I have reviewed the triage vital signs and the nursing notes.  Pertinent labs & imaging results that were available during my care of the patient were reviewed by me and considered in my medical decision making (see chart for details).     Urine pregnancy test was positive.  Suspicious that patient's abdominal cramping and associated symptoms could be related to early pregnancy symptoms.  Although, patient reported severe abdominal pain approximately 1 week ago with subsequent vaginal spotting.  Therefore, I do think this warrants further evaluation and management at the maternity assessment unit to ensure baby and patient are okay.  Patient was advised to go to the MAU for further evaluation and management and was agreeable with plan.  Vital signs stable at discharge.  Agree with patient self transfer to the hospital.  Will send cervicovaginal swab due to vaginal itching and vaginal discharge to determine if there are any other etiologies present.  Will await results for any further treatment at this time.  Do not think that urinalysis is necessary given no urinary symptoms are present.  Patient verbalized understanding and was agreeable with plan. Final Clinical Impressions(s) / UC Diagnoses   Final diagnoses:  Abdominal pain during pregnancy, antepartum   Bleeding in early pregnancy  Positive urine pregnancy test  Screening examination for venereal disease  Vaginal itching  Vaginal discharge     Discharge Instructions      Please go to the maternity assessment unit at [facility] for further evaluation and management giving that you have had bleeding and abdominal pain in early pregnancy.   ED Prescriptions   None    PDMP not reviewed this encounter.   Teodora Medici, [place] 10/15/21 [phone]

## 2021-10-15 NOTE — Discharge Instructions (Signed)

## 2021-10-15 NOTE — ED Triage Notes (Signed)
Pt states she took home pregnancy yesterday and it was positive. Pt states obgyn wants confirmation. Pt states vaginal itching, denies dysuria. Pt also states abdominal cramping for the past week.

## 2021-10-15 NOTE — MAU Provider Note (Signed)
S Ms. Delmar Dondero is a 26 y.o. [id number] female who presents to MAU today with no complaints. She was seen at [facility] today for pregnancy confirmation and reported one episode of spotting and cramping last week, none since. She was instructed to come here.    ROS: Negative  O BP 133/74 (BP Location: Right Arm)   Pulse 78   Temp 98.2 F (36.8 C) (Oral)   Resp 18   Ht 5\' 6"  (1.676 m)   Wt 127.7 kg   LMP 09/13/2021 (Approximate)   SpO2 99%   BMI 45.45 kg/m  Physical Exam Vitals and nursing note reviewed.  Constitutional:      General: She is not in acute distress.    Appearance: Normal appearance.  Cardiovascular:     Rate and Rhythm: Normal rate.  Pulmonary:     Effort: Pulmonary effort is normal. No respiratory distress.  Musculoskeletal:        General: Normal range of motion.     Cervical back: Normal range of motion.  Neurological:     General: No focal deficit present.     Mental Status: She is alert and oriented to person, place, and time.  Psychiatric:        Mood and Affect: Mood normal.        Behavior: Behavior normal.    Results for orders placed or performed during the hospital encounter of 10/15/21 (from the past 24 hour(s))  POCT urine pregnancy     Status: Abnormal   Collection Time: 10/15/21  3:52 PM  Result Value Ref Range   Preg Test, Ur Positive (A) Negative    MDM: no concerning symptoms therefore no further evaluation indicated. Return precautions discussed. Stable for discharge.  A 1. Positive pregnancy test     P Discharge from MAU in stable condition Warning signs for worsening condition that would warrant emergency follow-up discussed Patient may return to MAU as needed for pregnancy related complaints Follow up at [facility] to start care Start PNV daily  [facility], Donette Larry 10/15/2021 4:56 PM

## 2021-10-15 NOTE — Discharge Instructions (Signed)
Please go to the maternity assessment unit at [facility] for further evaluation and management giving that you have had bleeding and abdominal pain in early pregnancy.

## 2021-10-15 NOTE — MAU Note (Signed)
Alexandra Collins is a 26 y.o. at Unknown here in MAU reporting: seen @ Urgent Care today secondary spotting and cramping last week x1 day.  Had +UPT UC, instructed to come to MAU for further evaluation.  Reports no longer spotting or cramping since last week LMP: 09/13/2021 Onset of complaint: last week Pain score: 0 Vitals:   10/15/21 1646  BP: 133/74  Pulse: 78  Resp: 18  Temp: 98.2 F (36.8 C)  SpO2: 99%     FHT:N/A Lab orders placed from triage: None

## 2021-10-18 ENCOUNTER — Ambulatory Visit
Admission: EM | Admit: 2021-10-18 | Discharge: 2021-10-18 | Disposition: A | Discharge status: Home / Self Care | Payer: BC Managed Care – PPO | Attending: Nurse Practitioner | Admitting: Nurse Practitioner

## 2021-10-18 DIAGNOSIS — H1031 Unspecified acute conjunctivitis, right eye: Secondary | ICD-10-CM | POA: Diagnosis not present

## 2021-10-18 MED ORDER — POLYMYXIN B-TRIMETHOPRIM 10000-0.1 UNIT/ML-% OP SOLN: 1.0000 [drp] | Freq: Four times a day (QID) | OPHTHALMIC | 0 refills | Status: AC

## 2021-10-18 NOTE — ED Triage Notes (Signed)
Pt reports redness, pain and drainage in right eye since this morning.

## 2021-10-18 NOTE — ED Provider Notes (Signed)
[facility]    CSN: [id number] Arrival date & time: 10/18/21  0930      History   Chief Complaint Chief Complaint  Patient presents with   Eye Problem    HPI Alexandra Collins is a 26 y.o. female.    Eye Problem  Patient's presents after waking up with redness, pain, and drainage in the right eye.  She states when she woke up this morning the right eye was crusted with yellow drainage.  She also complains of sensitivity to light.  She denies fever, chills, cough, abdominal pain, or GI symptoms.  Patient wears glasses.  She states she is a Runner, broadcasting/film/video.  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 Authorizing Provider  trimethoprim-polymyxin b (POLYTRIM) ophthalmic solution Place 1 drop into both eyes every 6 (six) hours for 7 days. 10/18/21 10/25/21 Yes Sani Loiseau-Warren, Sadie Haber, NP  cetirizine (ZYRTEC ALLERGY) 10 MG tablet Take 1 tablet (10 mg total) by mouth daily. Patient not taking: Reported on 03/12/2020 01/04/20 04/29/20  Hall-Potvin, [place], PA-C  fluticasone (FLONASE) 50 MCG/ACT nasal spray Place 1 spray into both nostrils daily. Patient not taking: Reported on 03/12/2020 01/04/20 04/29/20  Hall-Potvin, [place], PA-C    Family History Family History  Problem Relation Age of Onset   Hypertension Mother    Seizures Father    Hypertension Father     Social  History Social History   Tobacco Use   Smoking status: Never   Smokeless tobacco: Never  Vaping Use   Vaping Use: Never used  Substance Use Topics   Alcohol use: No   Drug use: No     Allergies   Ceclor [cefaclor], Elemental sulfur, Garlic, Penicillins, Shellfish allergy, Sulfamethoxazole, and Augmentin [amoxicillin-pot clavulanate]   Review of Systems Review of Systems Per HPI  Physical Exam Triage Vital Signs ED Triage Vitals  Enc Vitals Group     BP 10/18/21 1032 133/78     Pulse Rate 10/18/21 1032 71     Resp 10/18/21 1032 16     Temp 10/18/21 1032 98.6 F (37 C)     Temp Source 10/18/21 1032 Oral     SpO2 10/18/21 1032 96 %     Weight --      Height --      Head Circumference --      Peak Flow --      Pain Score 10/18/21 1033 6     Pain Loc --      Pain Edu? --      Excl. in GC? --  No data found.  Updated Vital Signs BP 133/78 (BP Location: Right Arm)   Pulse 71   Temp 98.6 F (37 C) (Oral)   Resp 16   LMP 09/13/2021 (Approximate)   SpO2 96%   Visual Acuity Right Eye Distance:   Left Eye Distance:   Bilateral Distance:    Right Eye Near:   Left Eye Near:    Bilateral Near:     Physical Exam Vitals and nursing note reviewed.  Constitutional:      Appearance: She is well-developed.  HENT:     Head: Normocephalic and atraumatic.     Right Ear: Tympanic membrane, ear canal and external ear normal.     Left Ear: Tympanic membrane, ear canal and external ear normal.     Mouth/Throat:     Mouth: Mucous membranes are moist.  Eyes:     General: Lids are normal.        Right eye: No foreign body, discharge or hordeolum.        Left eye: No foreign body, discharge or hordeolum.     Conjunctiva/sclera:     Right eye: Right conjunctiva is injected.     Pupils: Pupils are equal, round, and reactive to light.     Comments: Conjunctival erythema  Neck:     Thyroid: No thyromegaly.     Trachea: No tracheal deviation.  Cardiovascular:     Rate  and Rhythm: Normal rate and regular rhythm.     Heart sounds: Normal heart sounds.  Pulmonary:     Effort: Pulmonary effort is normal.     Breath sounds: Normal breath sounds.  Abdominal:     General: Bowel sounds are normal. There is no distension.     Palpations: Abdomen is soft.     Tenderness: There is no abdominal tenderness.  Musculoskeletal:     Cervical back: Normal range of motion and neck supple.  Skin:    General: Skin is warm and dry.  Neurological:     Mental Status: She is alert and oriented to person, place, and time.  Psychiatric:        Behavior: Behavior normal.        Thought Content: Thought content normal.        Judgment: Judgment normal.      UC Treatments / Results  Labs (all labs ordered are listed, but only abnormal results are displayed) Labs Reviewed - No data to display  EKG   Radiology No results found.  Procedures Procedures (including critical care time)  Medications Ordered in UC Medications - No data to display  Initial Impression / Assessment and Plan / UC Course  I have reviewed the triage vital signs and the nursing notes.  Pertinent labs & imaging results that were available during my care of the patient were reviewed by me and considered in my medical decision making (see chart for details).  Based on patient's presentation, will treat for bacterial conjunctivitis.  We will treat with trimethoprim eyedrops.  Strict hand hygiene was emphasized to the patient, along with supportive care recommendations.  Patient advised to follow-up if symptoms worsen or do not improve. Final Clinical Impressions(s) / UC Diagnoses   Final diagnoses:  Acute bacterial conjunctivitis of right eye     Discharge Instructions      Apply medication as prescribed. Strict handwashing while symptoms persist. Cool cloths to the affected eye to help with any pain, irritation, or discomfort. Avoid rubbing, or manipulating the eye while symptoms  persist. Discard all eye make-up use prior to symptoms. Follow-up if symptoms worsen or do not improve after use of medication.     ED Prescriptions     Medication Sig Dispense Auth. Provider   trimethoprim-polymyxin b (POLYTRIM) ophthalmic solution Place 1 drop into both eyes every 6 (six) hours for 7 days. 10 mL Taraoluwa Thakur-Warren, Sadie Haberhristie J, NP      PDMP not reviewed this encounter.   Abran CantorLeath-Warren, Marquis Down J, NP 10/18/21 1054

## 2021-10-18 NOTE — Discharge Instructions (Addendum)
Apply medication as prescribed. Strict handwashing while symptoms persist. Cool cloths to the affected eye to help with any pain, irritation, or discomfort. Avoid rubbing, or manipulating the eye while symptoms persist. Discard all eye make-up use prior to symptoms. Follow-up if symptoms worsen or do not improve after use of medication.

## 2021-10-19 LAB — CERVICOVAGINAL ANCILLARY ONLY
Bacterial Vaginitis (gardnerella): NEGATIVE
Candida Glabrata: NEGATIVE
Candida Vaginitis: NEGATIVE
Chlamydia: NEGATIVE
Comment: NEGATIVE
Comment: NEGATIVE
Comment: NEGATIVE
Comment: NEGATIVE
Comment: NEGATIVE
Comment: NORMAL
Neisseria Gonorrhea: NEGATIVE
Trichomonas: NEGATIVE

## 2021-10-20 ENCOUNTER — Telehealth: Payer: Self-pay | Admitting: Family Medicine

## 2021-10-20 DIAGNOSIS — Z3491 Encounter for supervision of normal pregnancy, unspecified, first trimester: Secondary | ICD-10-CM

## 2021-10-20 NOTE — Telephone Encounter (Signed)
Patient called in wanting to set up new ob appointments but she has no idea how far long she is. I told the patient a nurse would call her back with plans for a dating ultrasound.

## 2021-10-20 NOTE — Telephone Encounter (Signed)
Called patient stating I am returning her phone call. Patient had Nexplanon removed in March and has had irregular periods since then. She reports bleeding in early May but it was only 2 days. Scheduled patient for dating ultrasound 6/27 @ 1030. Discussed new OB appt can be scheduled after that once we have an idea how far along she is. Patient verbalized understanding.

## 2021-10-30 ENCOUNTER — Inpatient Hospital Stay (HOSPITAL_COMMUNITY)
Admission: AD | Admit: 2021-10-30 | Discharge: 2021-10-30 | Disposition: A | Discharge status: Home / Self Care | Payer: BC Managed Care – PPO | Attending: Family Medicine | Admitting: Family Medicine

## 2021-10-30 ENCOUNTER — Inpatient Hospital Stay (HOSPITAL_COMMUNITY): Payer: BC Managed Care – PPO

## 2021-10-30 ENCOUNTER — Encounter (HOSPITAL_COMMUNITY): Payer: Self-pay | Admitting: Family Medicine

## 2021-10-30 DIAGNOSIS — R1030 Lower abdominal pain, unspecified: Secondary | ICD-10-CM | POA: Diagnosis present

## 2021-10-30 DIAGNOSIS — Z3201 Encounter for pregnancy test, result positive: Secondary | ICD-10-CM | POA: Diagnosis not present

## 2021-10-30 DIAGNOSIS — O26891 Other specified pregnancy related conditions, first trimester: Secondary | ICD-10-CM | POA: Insufficient documentation

## 2021-10-30 DIAGNOSIS — O4690 Antepartum hemorrhage, unspecified, unspecified trimester: Secondary | ICD-10-CM | POA: Diagnosis not present

## 2021-10-30 DIAGNOSIS — O26899 Other specified pregnancy related conditions, unspecified trimester: Secondary | ICD-10-CM

## 2021-10-30 DIAGNOSIS — O4691 Antepartum hemorrhage, unspecified, first trimester: Secondary | ICD-10-CM | POA: Insufficient documentation

## 2021-10-30 DIAGNOSIS — O3680X0 Pregnancy with inconclusive fetal viability, not applicable or unspecified: Secondary | ICD-10-CM | POA: Insufficient documentation

## 2021-10-30 DIAGNOSIS — Z3A01 Less than 8 weeks gestation of pregnancy: Secondary | ICD-10-CM | POA: Insufficient documentation

## 2021-10-30 DIAGNOSIS — O26851 Spotting complicating pregnancy, first trimester: Secondary | ICD-10-CM | POA: Diagnosis not present

## 2021-10-30 LAB — COMPREHENSIVE METABOLIC PANEL
ALT: 12 U/L (ref 0–44)
AST: 18 U/L (ref 15–41)
Albumin: 3.5 g/dL (ref 3.5–5.0)
Alkaline Phosphatase: 53 U/L (ref 38–126)
Anion gap: 8 (ref 5–15)
BUN: 11 mg/dL (ref 6–20)
CO2: 25 mmol/L (ref 22–32)
Calcium: 9 mg/dL (ref 8.9–10.3)
Chloride: 105 mmol/L (ref 98–111)
Creatinine, Ser: 0.9 mg/dL (ref 0.44–1.00)
GFR, Estimated: >60 mL/min (ref >60)
Glucose, Bld: 82 mg/dL (ref 70–99)
Potassium: 4.5 mmol/L (ref 3.5–5.1)
Sodium: 138 mmol/L (ref 135–145)
Total Bilirubin: 0.5 mg/dL (ref 0.3–1.2)
Total Protein: 6.5 g/dL (ref 6.5–8.1)

## 2021-10-30 LAB — URINALYSIS, ROUTINE W REFLEX MICROSCOPIC
Bilirubin Urine: NEGATIVE
Glucose, UA: NEGATIVE mg/dL
Ketones, ur: NEGATIVE mg/dL
Leukocytes,Ua: NEGATIVE
Nitrite: NEGATIVE
Protein, ur: 30 mg/dL — AB
RBC / HPF: >50 RBC/hpf — ABNORMAL HIGH (ref 0–5)
Specific Gravity, Urine: 1.027 (ref 1.005–1.030)
pH: 6 (ref 5.0–8.0)

## 2021-10-30 LAB — CBC WITH DIFFERENTIAL/PLATELET
Abs Immature Granulocytes: 0.02 10*3/uL (ref 0.00–0.07)
Basophils Absolute: 0 10*3/uL (ref 0.0–0.1)
Basophils Relative: 1 %
Eosinophils Absolute: 0.1 10*3/uL (ref 0.0–0.5)
Eosinophils Relative: 1 %
HCT: 35.5 % — ABNORMAL LOW (ref 36.0–46.0)
Hemoglobin: 12.2 g/dL (ref 12.0–15.0)
Immature Granulocytes: 0 %
Lymphocytes Relative: 41 %
Lymphs Abs: 2.3 10*3/uL (ref 0.7–4.0)
MCH: 31 pg (ref 26.0–34.0)
MCHC: 34.4 g/dL (ref 30.0–36.0)
MCV: 90.3 fL (ref 80.0–100.0)
Monocytes Absolute: 0.5 10*3/uL (ref 0.1–1.0)
Monocytes Relative: 9 %
Neutro Abs: 2.8 10*3/uL (ref 1.7–7.7)
Neutrophils Relative %: 48 %
Platelets: 333 10*3/uL (ref 150–400)
RBC: 3.93 MIL/uL (ref 3.87–5.11)
RDW: 13.3 % (ref 11.5–15.5)
WBC: 5.6 10*3/uL (ref 4.0–10.5)
nRBC: 0 % (ref 0.0–0.2)

## 2021-10-30 LAB — WET PREP, GENITAL
Clue Cells Wet Prep HPF POC: NONE SEEN
Sperm: NONE SEEN
Trich, Wet Prep: NONE SEEN
WBC, Wet Prep HPF POC: <10 (ref <10)
Yeast Wet Prep HPF POC: NONE SEEN

## 2021-10-30 LAB — HCG, QUANTITATIVE, PREGNANCY: hCG, Beta Chain, Quant, S: 764 m[IU]/mL — ABNORMAL HIGH (ref <5)

## 2021-10-30 NOTE — MAU Note (Signed)
.  Alexandra Collins is a 26 y.o. at [redacted]w[redacted]d here in MAU reporting: VB spotting, cramping, and intermittent chills since 0900 today. Pt reports light brownish red blood with some clotting, wearing a pad with a light flow just a little more than spotting. Pt states she is experiencing alternating unilateral and bilateral lower ABD cramping that is intermittent. Pt denies LOF and abnormal discharge. Last intercourse 7 days ago.  Onset of complaint: 0900 Pain score: 4/10 Vitals:   10/30/21 1918  BP: 138/69  Pulse: 100  Resp: 18  Temp: 98.6 F (37 C)  SpO2: 100%      Lab orders placed from triage:  UA

## 2021-11-02 LAB — GC/CHLAMYDIA PROBE AMP (~~LOC~~) NOT AT ARMC
Chlamydia: NEGATIVE
Comment: NEGATIVE
Comment: NORMAL
Neisseria Gonorrhea: NEGATIVE

## 2021-11-03 ENCOUNTER — Ambulatory Visit (INDEPENDENT_AMBULATORY_CARE_PROVIDER_SITE_OTHER): Payer: BC Managed Care – PPO

## 2021-11-03 ENCOUNTER — Ambulatory Visit (INDEPENDENT_AMBULATORY_CARE_PROVIDER_SITE_OTHER): Payer: BC Managed Care – PPO | Admitting: Family Medicine

## 2021-11-03 ENCOUNTER — Encounter: Payer: Self-pay | Admitting: Family Medicine

## 2021-11-03 VITALS — BP 117/78 | HR 70

## 2021-11-03 DIAGNOSIS — O039 Complete or unspecified spontaneous abortion without complication: Secondary | ICD-10-CM | POA: Insufficient documentation

## 2021-11-03 DIAGNOSIS — Z3491 Encounter for supervision of normal pregnancy, unspecified, first trimester: Secondary | ICD-10-CM | POA: Diagnosis not present

## 2021-11-07 DIAGNOSIS — Z419 Encounter for procedure for purposes other than remedying health state, unspecified: Secondary | ICD-10-CM | POA: Diagnosis not present

## 2021-11-12 ENCOUNTER — Ambulatory Visit: Payer: BC Managed Care – PPO

## 2021-12-08 DIAGNOSIS — Z419 Encounter for procedure for purposes other than remedying health state, unspecified: Secondary | ICD-10-CM | POA: Diagnosis not present

## 2021-12-17 ENCOUNTER — Encounter: Payer: Self-pay | Admitting: Family Medicine

## 2021-12-21 ENCOUNTER — Other Ambulatory Visit: Payer: BC Managed Care – PPO

## 2021-12-21 ENCOUNTER — Other Ambulatory Visit: Payer: Self-pay

## 2021-12-21 DIAGNOSIS — O039 Complete or unspecified spontaneous abortion without complication: Secondary | ICD-10-CM

## 2021-12-22 ENCOUNTER — Telehealth: Payer: Self-pay

## 2021-12-22 DIAGNOSIS — Z3490 Encounter for supervision of normal pregnancy, unspecified, unspecified trimester: Secondary | ICD-10-CM

## 2021-12-22 LAB — BETA HCG QUANT (REF LAB): hCG Quant: 30360 m[IU]/mL

## 2021-12-22 NOTE — Telephone Encounter (Signed)
HCG level on 12/21/21 was 30,360. This was completed due to recent miscarriage and positive home UPT following. Unsure if positive UPT was due to previous or new pregnancy. Reviewed with Crissie Reese, MD who states HCG level show this is new pregnancy and patient should be scheduled ASAP for dating/viability [place]. Called pt and [place] appt scheduled for 12/29/21.

## 2021-12-29 ENCOUNTER — Other Ambulatory Visit: Payer: Self-pay | Admitting: Family Medicine

## 2021-12-29 ENCOUNTER — Ambulatory Visit (INDEPENDENT_AMBULATORY_CARE_PROVIDER_SITE_OTHER): Payer: BC Managed Care – PPO

## 2021-12-29 DIAGNOSIS — Z3490 Encounter for supervision of normal pregnancy, unspecified, unspecified trimester: Secondary | ICD-10-CM | POA: Diagnosis not present

## 2021-12-30 ENCOUNTER — Telehealth: Payer: BC Managed Care – PPO | Admitting: Physician Assistant

## 2021-12-30 DIAGNOSIS — H109 Unspecified conjunctivitis: Secondary | ICD-10-CM

## 2021-12-30 MED ORDER — POLYMYXIN B-TRIMETHOPRIM 10000-0.1 UNIT/ML-% OP SOLN: 1.0000 [drp] | OPHTHALMIC | 0 refills | Status: DC

## 2021-12-30 NOTE — Progress Notes (Signed)

## 2022-01-01 NOTE — Progress Notes (Signed)
Called patient to notify her of the results, which show viable IUP and she is about [redacted] weeks pregnant. Patient happy with this news. She is already scheduled for new OB intake and new OB visit in a few weeks.

## 2022-01-06 ENCOUNTER — Telehealth: Payer: BC Managed Care – PPO

## 2022-01-08 DIAGNOSIS — Z419 Encounter for procedure for purposes other than remedying health state, unspecified: Secondary | ICD-10-CM | POA: Diagnosis not present

## 2022-01-20 ENCOUNTER — Telehealth: Payer: BC Managed Care – PPO

## 2022-01-21 ENCOUNTER — Telehealth (INDEPENDENT_AMBULATORY_CARE_PROVIDER_SITE_OTHER): Payer: BC Managed Care – PPO

## 2022-01-21 DIAGNOSIS — Z3689 Encounter for other specified antenatal screening: Secondary | ICD-10-CM

## 2022-01-21 DIAGNOSIS — Z3480 Encounter for supervision of other normal pregnancy, unspecified trimester: Secondary | ICD-10-CM | POA: Insufficient documentation

## 2022-01-21 NOTE — Progress Notes (Signed)
New OB Intake  I connected with  [name] on 01/21/22 at  3:15 PM EDT by MyChart Video Visit and verified that I am speaking with the correct person using two identifiers. Nurse is located at [facility] and pt is located at [employer].  I discussed the limitations, risks, security and privacy concerns of performing an evaluation and management service by telephone and the availability of in person appointments. I also discussed with the patient that there may be a patient responsible charge related to this service. The patient expressed understanding and agreed to proceed.  I explained I am completing New OB Intake today. We discussed her EDD of 08/10/22 that is based on LMP of 11/03/21. Pt is G3/P1. I reviewed her allergies, medications, Medical/Surgical/OB history, and appropriate screenings. I informed her of [facility] services. [facility] information placed in AVS. Based on history, this is a/an  pregnancy uncomplicated .   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 Patient given information for [facility] website for more information about [facility]. Patient is not interested in water birth. Offered upcoming OB visit with CNM to discuss further.  MyChart/Babyscripts MyChart access verified. I explained pt will have some visits in office and some virtually. Babyscripts instructions given and order placed. Patient verifies receipt of registration text/e-mail. Account successfully created and app downloaded.  Blood Pressure Cuff/Weight Scale Pt has her own BP Cuff, Explained after first prenatal appt pt will check weekly and document in Babyscripts. Patient does / does not  have weight scale. Weight scale  ordered for patient to pick up from [employer].   Anatomy US Explained first scheduled [place] will be around 19 weeks. Anatomy US scheduled for 03/16/22 at 0245. Pt notified to arrive at 0230.  Labs Discussed Avelina Laine genetic screening with patient. Would like both Panorama and Horizon drawn at new OB visit. Routine prenatal labs needed.  Covid Vaccine Patient has covid vaccine.   Is patient a CenteringPregnancy candidate?  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 Not a candidate due to NA Centering Patient" indicated on sticky note   Is patient a Mom+Baby Combined Care candidate?  Not a candidate    Scheduled with Mom+Baby provider   Social Determinants of Health Food Insecurity: Patient denies food insecurity. WIC Referral: Patient is interested in referral to [facility].  Transportation: Patient denies transportation needs. Childcare: Discussed no children allowed at ultrasound appointments. Offered childcare services; patient declines childcare services at this time.  First visit review I reviewed new OB appt with pt. I explained she will have a provider visit that includes . Explained pt will be seen by Dr.Ndulue at first visit; encounter routed to appropriate provider. Explained that patient will be seen by pregnancy navigator following visit with provider.   Henrietta Dine, CMA 01/21/2022  3:17 PM

## 2022-01-22 ENCOUNTER — Ambulatory Visit
Admission: EM | Admit: 2022-01-22 | Discharge: 2022-01-22 | Disposition: A | Discharge status: Home / Self Care | Payer: BC Managed Care – PPO | Attending: Nurse Practitioner | Admitting: Nurse Practitioner

## 2022-01-22 ENCOUNTER — Encounter: Payer: Self-pay | Admitting: Emergency Medicine

## 2022-01-22 DIAGNOSIS — Z1152 Encounter for screening for COVID-19: Secondary | ICD-10-CM

## 2022-01-22 DIAGNOSIS — U071 COVID-19: Secondary | ICD-10-CM | POA: Diagnosis present

## 2022-01-22 DIAGNOSIS — J069 Acute upper respiratory infection, unspecified: Secondary | ICD-10-CM | POA: Diagnosis present

## 2022-01-22 LAB — RESP PANEL BY RT-PCR (FLU A&B, COVID) ARPGX2
Influenza A by PCR: NEGATIVE
Influenza B by PCR: NEGATIVE
SARS Coronavirus 2 by RT PCR: POSITIVE — AB

## 2022-01-22 NOTE — ED Provider Notes (Signed)
[facility]    CSN: [id number] Arrival date & time: 01/22/22  0947      History   Chief Complaint Chief Complaint  Patient presents with   Sore Throat   Headache   Generalized Body Aches    HPI Alexandra Collins is a 26 y.o. female.   Patient presents for positive COVID-19 test at home.  She endorses body aches, muscle pain in her neck, headache, weakness, chest congestion, postnasal drainage, sore throat, right-sided ear pain, fatigue, and nausea.  Also reports she is about [redacted] weeks pregnant and unsure which are symptoms of pregnancy.  She denies chest pain, shortness of breath, wheezing, chest tightness, runny nose, sneezing, sinus pressure, tooth pain, eye redness or itching, abdominal pain, vomiting, diarrhea, decreased appetite, loss of taste or smell, rash.  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  She is unsure what over-the-counter medications are safe for her to take in pregnancy.  Patient reports she called the maternity admission unit and they told her to come to urgent care for neck stiffness.       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 Authorizing Provider  Prenatal Vit-Fe Fumarate-FA  (MULTIVITAMIN-PRENATAL) 27-0.8 MG TABS tablet Take 1 tablet by mouth daily at 12 noon.    [provider]  trimethoprim-polymyxin b (POLYTRIM) ophthalmic solution Place 1 drop into the right eye every 4 (four) hours. X 5 days 12/30/21   Margaretann Loveless, PA-C  cetirizine (ZYRTEC ALLERGY) 10 MG tablet Take 1 tablet (10 mg total) by mouth daily. Patient not taking: Reported on 03/12/2020 01/04/20 04/29/20  Hall-Potvin, [place], PA-C  fluticasone (FLONASE) 50 MCG/ACT nasal spray Place 1 spray into both nostrils daily. Patient not taking: Reported on 03/12/2020 01/04/20 04/29/20  Hall-Potvin, [place], PA-C    Family History Family History  Problem Relation Age of Onset   Hypertension Mother    Seizures Father    Hypertension Father     Social History Social History   Tobacco Use   Smoking status: Never   Smokeless tobacco: Never  Vaping Use   Vaping Use: Never used  Substance Use Topics   Alcohol use: No   Drug use: No     Allergies   Ceclor [cefaclor], Elemental sulfur, Garlic, Penicillins, Shellfish allergy, Sulfamethoxazole, and Augmentin [amoxicillin-pot clavulanate]   Review of Systems Review of Systems Per HPI  Physical Exam Triage Vital Signs ED Triage Vitals  Enc Vitals  Group     BP 01/22/22 1247 125/76     Pulse Rate 01/22/22 1247 83     Resp 01/22/22 1247 18     Temp 01/22/22 1247 98.8 F (37.1 C)     Temp src --      SpO2 01/22/22 1247 99 %     Weight --      Height --      Head Circumference --      Peak Flow --      Pain Score 01/22/22 1246 6     Pain Loc --      Pain Edu? --      Excl. in [place]? --    No data found.  Updated Vital Signs BP 125/76   Pulse 83   Temp 98.8 F (37.1 C)   Resp 18   LMP 11/03/2021   SpO2 99%   Breastfeeding No   Visual Acuity Right Eye Distance:   Left Eye Distance:   Bilateral Distance:    Right Eye Near:   Left Eye Near:    Bilateral Near:     Physical Exam Vitals and nursing note reviewed.   Constitutional:      General: She is not in acute distress.    Appearance: Normal appearance. She is not ill-appearing or toxic-appearing.  HENT:     Head: Normocephalic and atraumatic.     Right Ear: Tympanic membrane, ear canal and external ear normal. There is no impacted cerumen.     Left Ear: Tympanic membrane, ear canal and external ear normal. There is no impacted cerumen.     Nose: Rhinorrhea present. No congestion.     Mouth/Throat:     Mouth: Mucous membranes are moist.     Pharynx: Oropharynx is clear. Posterior oropharyngeal erythema present. No oropharyngeal exudate.  Eyes:     General: No scleral icterus.    Extraocular Movements: Extraocular movements intact.  Cardiovascular:     Rate and Rhythm: Normal rate and regular rhythm.  Pulmonary:     Effort: Pulmonary effort is normal. No respiratory distress.     Breath sounds: Normal breath sounds. No wheezing, rhonchi or rales.  Musculoskeletal:     Cervical back: Normal range of motion and neck supple. No rigidity. Muscular tenderness present.  Lymphadenopathy:     Cervical: No cervical adenopathy.  Skin:    General: Skin is warm and dry.     Coloration: Skin is not jaundiced or pale.     Findings: No erythema or rash.  Neurological:     Mental Status: She is alert and oriented to person, place, and time.  Psychiatric:        Behavior: Behavior is cooperative.      UC Treatments / Results  Labs (all labs ordered are listed, but only abnormal results are displayed) Labs Reviewed  RESP PANEL BY RT-PCR (FLU A&B, COVID) [id number]    EKG   Radiology No results found.  Procedures Procedures (including critical care time)  Medications Ordered in UC Medications - No data to display  Initial Impression / Assessment and Plan / UC Course  I have reviewed the triage vital signs and the nursing notes.  Pertinent labs & imaging results that were available during my care of the patient were reviewed by me and  considered in my medical decision making (see chart for details).    Patient is well-appearing, normotensive, afebrile, not tachycardic, not tachypneic, oxygenating well on room air.  Will obtain repeat COVID-19  testing as patient reports she does not believe her at home test because it is expired.  No neck stiffness or regidity on examination.  Reassurance provided and supportive care discussed.  List given of over-the-counter medications that are safe in pregnancy.  ER precautions and return precautions discussed.  Note given for work.  The patient was given the opportunity to ask questions.  All questions answered to their satisfaction.  The patient is in agreement to this plan.  Final Clinical Impressions(s) / UC Diagnoses   Final diagnoses:  Encounter for screening for COVID-19  Positive self-administered antigen test for COVID-19  Viral URI with cough     Discharge Instructions      Your symptoms and exam findings are most consistent with a viral upper respiratory infection.  We have tested you today for COVID-19 and influenza.  You will see the results in Mychart and we will call you with positive results.    Please stay home and isolate until you are aware of the results.    Some things that can make you feel better are: - Increased rest - Increasing fluid with water/sugar free electrolytes - Acetaminophen 500 mg every 6 hours as needed for fever/pain.  - Salt water gargling, chloraseptic spray and throat lozenges - Cepacol lozenges to help with throat pain. - Coricidin HBP - alcohol free cough drops - OTC guaifenesin (Mucinex), dextromethorphan - Saline sinus flushes or a neti pot.  - Humidifying the air. - Vicks vaporub      ED Prescriptions   None    PDMP not reviewed this encounter.   Valentino Nose, NP 01/22/22 1500

## 2022-01-22 NOTE — Discharge Instructions (Addendum)
Your symptoms and exam findings are most consistent with a viral upper respiratory infection.  We have tested you today for COVID-19 and influenza.  You will see the results in Mychart and we will call you with positive results.    Please stay home and isolate until you are aware of the results.    Some things that can make you feel better are: - Increased rest - Increasing fluid with water/sugar free electrolytes - Acetaminophen 500 mg every 6 hours as needed for fever/pain.  - Salt water gargling, chloraseptic spray and throat lozenges - Cepacol lozenges to help with throat pain. - Coricidin HBP - alcohol free cough drops - OTC guaifenesin (Mucinex), dextromethorphan - Saline sinus flushes or a neti pot.  - Humidifying the air. - Vicks vaporub

## 2022-01-22 NOTE — ED Triage Notes (Addendum)
Pt is present today with sore throat, HA, and body aches. Pt sx started yesterday.  Pt states that she tested positive for covid yesterday

## 2022-01-25 ENCOUNTER — Ambulatory Visit
Admission: RE | Admit: 2022-01-25 | Discharge: 2022-01-25 | Disposition: A | Discharge status: Home / Self Care | Payer: BC Managed Care – PPO | Source: Ambulatory Visit | Attending: Nurse Practitioner | Admitting: Nurse Practitioner

## 2022-01-25 VITALS — BP 132/76 | HR 87 | Temp 99.0°F | Resp 20

## 2022-01-25 DIAGNOSIS — U071 COVID-19: Secondary | ICD-10-CM | POA: Diagnosis not present

## 2022-01-25 DIAGNOSIS — H66001 Acute suppurative otitis media without spontaneous rupture of ear drum, right ear: Secondary | ICD-10-CM

## 2022-01-25 MED ORDER — AZITHROMYCIN 250 MG PO TABS: ORAL_TABLET | ORAL | 0 refills | Status: DC

## 2022-01-25 NOTE — ED Triage Notes (Signed)
Pt states that she is here to follow up after last visit not doing any better and now has ear pain. She is taking mucinex but says she is on her last dose.

## 2022-01-25 NOTE — Discharge Instructions (Signed)
You have an ear infection.  Please take the azithromycin as prescribed.  Continue the Mucinex, drinking plenty fluids, and Tylenol to help with the headache and muscle pain in your neck.  Your symptoms should improve towards the end of this week.

## 2022-01-25 NOTE — ED Provider Notes (Signed)
[facility]    CSN: [id number] Arrival date & time: 01/25/22  1338      History   Chief Complaint Chief Complaint  Patient presents with   Follow-up    Covid test was positive. Nurse called and told me to be seen again, symptoms are the same (5days passed) nurse wants a check for pneumonia - Entered by patient   Otalgia    HPI Alexandra Collins is a 26 y.o. female.   Patient presents for worsening COVID symptoms.  Reports she tested +3 days ago.  Reports her symptoms have persisted and now she is also having ear pain.  She endorses continued body aches, muscle pain in her neck, headache, chest congestion, sore throat, fatigue, and nausea.  She has been taking Mucinex and Tylenol.  Reports these medicines help until they wear out of her system.  She denies chest pain, shortness of breath, and chest tightness.  She is currently [redacted] weeks pregnant.    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 Authorizing Provider  azithromycin (ZITHROMAX) 250 MG tablet Take (2) tablets by mouth on day 1, then take (1) tablet by mouth on days 2-5. 01/25/22  Yes Valentino Nose, NP  Prenatal Vit-Fe Fumarate-FA (MULTIVITAMIN-PRENATAL) 27-0.8 MG TABS tablet Take 1 tablet by mouth daily at 12  noon.    [provider]  trimethoprim-polymyxin b (POLYTRIM) ophthalmic solution Place 1 drop into the right eye every 4 (four) hours. X 5 days 12/30/21   Margaretann Loveless, PA-C  cetirizine (ZYRTEC ALLERGY) 10 MG tablet Take 1 tablet (10 mg total) by mouth daily. Patient not taking: Reported on 03/12/2020 01/04/20 04/29/20  Hall-Potvin, [place], PA-C  fluticasone (FLONASE) 50 MCG/ACT nasal spray Place 1 spray into both nostrils daily. Patient not taking: Reported on 03/12/2020 01/04/20 04/29/20  Hall-Potvin, [place], PA-C    Family History Family History  Problem Relation Age of Onset   Hypertension Mother    Seizures Father    Hypertension Father     Social History Social History   Tobacco Use   Smoking status: Never   Smokeless tobacco: Never  Vaping Use   Vaping Use: Never used  Substance Use Topics   Alcohol use: No   Drug use: No     Allergies   Ceclor [cefaclor], Elemental sulfur, Garlic, Penicillins, Shellfish allergy, Sulfamethoxazole, and Augmentin [amoxicillin-pot clavulanate]   Review of Systems Review of Systems Per HPI  Physical Exam Triage Vital Signs ED Triage Vitals  Enc  Vitals Group     BP 01/25/22 1415 132/76     Pulse Rate 01/25/22 1415 87     Resp 01/25/22 1415 20     Temp 01/25/22 1415 99 F (37.2 C)     Temp Source 01/25/22 1415 Oral     SpO2 01/25/22 1415 96 %     Weight --      Height --      Head Circumference --      Peak Flow --      Pain Score 01/25/22 1414 0     Pain Loc --      Pain Edu? --      Excl. in [place]? --    No data found.  Updated Vital Signs BP 132/76 (BP Location: Right Arm)   Pulse 87   Temp 99 F (37.2 C) (Oral)   Resp 20   LMP 11/03/2021   SpO2 96%   Visual Acuity Right Eye Distance:   Left Eye Distance:   Bilateral Distance:    Right Eye Near:   Left Eye Near:    Bilateral Near:     Physical Exam Vitals and nursing note reviewed.  Constitutional:      General: She is not in acute  distress.    Appearance: Normal appearance. She is not ill-appearing or toxic-appearing.  HENT:     Head: Normocephalic and atraumatic.     Right Ear: Tympanic membrane is erythematous and bulging.     Left Ear: Tympanic membrane, ear canal and external ear normal.     Nose: Congestion and rhinorrhea present.     Mouth/Throat:     Mouth: Mucous membranes are moist.     Pharynx: Oropharynx is clear. Posterior oropharyngeal erythema present. No oropharyngeal exudate.  Eyes:     General: No scleral icterus.    Extraocular Movements: Extraocular movements intact.  Cardiovascular:     Rate and Rhythm: Normal rate and regular rhythm.  Pulmonary:     Effort: Pulmonary effort is normal. No respiratory distress.     Breath sounds: Normal breath sounds. No wheezing, rhonchi or rales.  Abdominal:     General: Abdomen is flat. Bowel sounds are normal. There is no distension.     Palpations: Abdomen is soft.  Musculoskeletal:     Cervical back: Normal range of motion and neck supple.  Lymphadenopathy:     Cervical: Cervical adenopathy present.  Skin:    General: Skin is warm and dry.     Coloration: Skin is not jaundiced or pale.     Findings: No erythema or rash.  Neurological:     Mental Status: She is alert and oriented to person, place, and time.  Psychiatric:        Behavior: Behavior is cooperative.      UC Treatments / Results  Labs (all labs ordered are listed, but only abnormal results are displayed) Labs Reviewed - No data to display  EKG   Radiology No results found.  Procedures Procedures (including critical care time)  Medications Ordered in UC Medications - No data to display  Initial Impression / Assessment and Plan / UC Course  I have reviewed the triage vital signs and the nursing notes.  Pertinent labs & imaging results that were available during my care of the patient were reviewed by me and considered in my medical decision making (see chart for  details).    Patient is well-appearing, normotensive, afebrile, not tachycardic, not tachypneic, oxygenating well on room  air.  On examination, patient has otitis media of the right ear.  We will treat with azithromycin as she is allergic to amoxicillin and cephalosporins.  ER and return precautions discussed.  Extended note given for work since her symptoms have not improved after 5 days.  The patient was given the opportunity to ask questions.  All questions answered to their satisfaction.  The patient is in agreement to this plan.   Final Clinical Impressions(s) / UC Diagnoses   Final diagnoses:  COVID-19 virus infection  Non-recurrent acute suppurative otitis media of right ear without spontaneous rupture of tympanic membrane     Discharge Instructions      You have an ear infection.  Please take the azithromycin as prescribed.  Continue the Mucinex, drinking plenty fluids, and Tylenol to help with the headache and muscle pain in your neck.  Your symptoms should improve towards the end of this week.     ED Prescriptions     Medication Sig Dispense Auth. Provider   azithromycin (ZITHROMAX) 250 MG tablet Take (2) tablets by mouth on day 1, then take (1) tablet by mouth on days 2-5. 6 tablet Valentino Nose, NP      PDMP not reviewed this encounter.   Valentino Nose, NP 01/25/22 1734

## 2022-02-03 ENCOUNTER — Ambulatory Visit: Payer: BC Managed Care – PPO | Admitting: Family Medicine

## 2022-02-04 ENCOUNTER — Ambulatory Visit (INDEPENDENT_AMBULATORY_CARE_PROVIDER_SITE_OTHER): Payer: BC Managed Care – PPO | Admitting: Family Medicine

## 2022-02-04 ENCOUNTER — Other Ambulatory Visit: Payer: Self-pay

## 2022-02-04 ENCOUNTER — Other Ambulatory Visit (HOSPITAL_COMMUNITY)
Admission: RE | Admit: 2022-02-04 | Discharge: 2022-02-04 | Disposition: A | Discharge status: Home / Self Care | Payer: BC Managed Care – PPO | Source: Ambulatory Visit

## 2022-02-04 VITALS — BP 106/55 | HR 80 | Wt 280.7 lb

## 2022-02-04 DIAGNOSIS — Z3A13 13 weeks gestation of pregnancy: Secondary | ICD-10-CM

## 2022-02-04 DIAGNOSIS — O99210 Obesity complicating pregnancy, unspecified trimester: Secondary | ICD-10-CM

## 2022-02-04 DIAGNOSIS — Z3480 Encounter for supervision of other normal pregnancy, unspecified trimester: Secondary | ICD-10-CM | POA: Insufficient documentation

## 2022-02-04 DIAGNOSIS — Z8759 Personal history of other complications of pregnancy, childbirth and the puerperium: Secondary | ICD-10-CM

## 2022-02-04 DIAGNOSIS — O26892 Other specified pregnancy related conditions, second trimester: Secondary | ICD-10-CM

## 2022-02-04 DIAGNOSIS — R519 Headache, unspecified: Secondary | ICD-10-CM

## 2022-02-04 MED ORDER — CYCLOBENZAPRINE HCL 5 MG PO TABS: 5.0000 mg | ORAL_TABLET | Freq: Three times a day (TID) | ORAL | 1 refills | Status: DC | PRN

## 2022-02-04 MED ORDER — ASPIRIN 81 MG PO TBEC: 81.0000 mg | DELAYED_RELEASE_TABLET | Freq: Every day | ORAL | 12 refills | Status: DC

## 2022-02-04 NOTE — Progress Notes (Addendum)
History:   Alexandra Collins is a 26 y.o. G3P1011 at [redacted]w[redacted]d by LMP being seen today for her first obstetrical visit.  Her obstetrical history is significant for obesity and history of pre-eclampsia with severe features . Patient does intend to breast and bottle feed. Pregnancy history fully reviewed.  Patient reports backache, headache, nausea, and no contractions.  Has had intermittent h/aches in the last 2 weeks. Usually occurs towards the end of the day. Feels like its at the back of her head. Also having some back pain while busy at work. She is a Pharmacist, hospital. No contractions, cramping, vaginal bleeding.   Had nausea in 1st trimester. Has improved, but still have some bloating/indigestion after meals. No recent vomiting.  Last pregnancy: had severe pre-eclampsia, needed to be readmitted and receive magnesium. Also had a post spinal h/ache from her epidural that required a blood patch, neurology consultation, and treated with caffeine.     HISTORY: OB History  Gravida Para Term Preterm AB Living  3 1 1  0 1 1  SAB IAB Ectopic Multiple Live Births  1 0 0 0 1    # Outcome Date GA Lbr Len/2nd Weight Sex Delivery Anes PTL Lv  3 Current           2 Term 07/26/19 [redacted]w[redacted]d / 02:22 6 lb 13.9 oz (3.116 kg) F Vag-Spont EPI, Intrathecal  LIV     Birth Comments: moulding/caput     Name: Lum Babe     Apgar1: 9  Apgar5: 9  1 SAB             Pap smear collected today.  Past Medical History:  Diagnosis Date   Anemia    Hydradenitis    Past Surgical History:  Procedure Laterality Date   KNEE ARTHROSCOPY Right    Family History  Problem Relation Age of Onset   Hypertension Mother    Seizures Father    Hypertension Father    Social History   Tobacco Use   Smoking status: Never   Smokeless tobacco: Never  Vaping Use   Vaping Use: Never used  Substance Use Topics   Alcohol use: No   Drug use: No   Allergies  Allergen Reactions   Ceclor [Cefaclor] Hives   Elemental Sulfur  Hives   Garlic Hives    Only allergic to raw garlic   Penicillins Hives    Has patient had a PCN reaction causing immediate rash, facial/tongue/throat swelling, SOB or lightheadedness with hypotension: Yes Has patient had a PCN reaction causing severe rash involving mucus membranes or skin necrosis: Yes Has patient had a PCN reaction that required hospitalization: No Has patient had a PCN reaction occurring within the last 10 years: No If all of the above answers are "NO", then may proceed with Cephalosporin use.    Shellfish Allergy Itching   Sulfamethoxazole Nausea And Vomiting   Augmentin [Amoxicillin-Pot Clavulanate] Rash   Current Outpatient Medications on File Prior to Visit  Medication Sig Dispense Refill   Prenatal Vit-Fe Fumarate-FA (MULTIVITAMIN-PRENATAL) 27-0.8 MG TABS tablet Take 1 tablet by mouth daily at 12 noon.     [DISCONTINUED] cetirizine (ZYRTEC ALLERGY) 10 MG tablet Take 1 tablet (10 mg total) by mouth daily. (Patient not taking: Reported on 03/12/2020) 30 tablet 0   [DISCONTINUED] fluticasone (FLONASE) 50 MCG/ACT nasal spray Place 1 spray into both nostrils daily. (Patient not taking: Reported on 03/12/2020) 16 g 0   No current facility-administered medications on file prior to visit.  Review of Systems Pertinent items noted in HPI and remainder of comprehensive ROS otherwise negative.  Indications for ASA therapy (per UpToDate) One of the following: Previous pregnancy with preeclampsia, especially early onset and with an adverse outcome Yes Multifetal gestation No Chronic hypertension No Type 1 or 2 diabetes mellitus No Chronic kidney disease No Autoimmune disease (antiphospholipid syndrome, systemic lupus erythematosus) No Two or more of the following: Nulliparity No Obesity (body mass index >30 kg/m2) Yes Family history of preeclampsia in mother or sister No Age ?35 years No Sociodemographic characteristics (African American race, low socioeconomic  level) Yes Personal risk factors (eg, previous pregnancy with low birth weight or small for gestational age infant, previous adverse pregnancy outcome [eg, stillbirth], interval >10 years between pregnancies) No   Physical Exam:   Vitals:   02/04/22 1528  BP: (!) 106/55  Pulse: 80  Weight: 280 lb 11.2 oz (127.3 kg)   Fetal Heart Rate (bpm): 155    General: well-developed, well-nourished female in no acute distress  Skin: normal coloration and turgor, no rashes  Neurologic: oriented, normal, negative, normal mood  Extremities: normal strength, tone, and muscle mass, ROM of all joints is normal  HEENT PERRLA, extraocular movement intact and sclera clear, anicteric  Neck supple and no masses  Cardiovascular: regular rate and rhythm  Respiratory:  no respiratory distress, normal breath sounds  Abdomen: soft, non-tender; bowel sounds normal; no masses,  no organomegaly  Pelvic: normal external genitalia, no lesions, normal vaginal mucosa, normal vaginal discharge, normal cervix, pap smear done. Exam done in the presence of a chaperone.     Assessment:    Pregnancy: G3P1011 Patient Active Problem List   Diagnosis Date Noted   Supervision of other normal pregnancy, antepartum 01/21/2022   Miscarriage 11/03/2021   History of gestational hypertension 08/24/2019   History of pre-eclampsia 08/24/2019   Foot pain 03/05/2013   Acne 02/26/2013   Suppurative hidradenitis 07/26/2012     Plan:    1. Supervision of other normal pregnancy, antepartum Doing well overall. Discussed small frequent meals to help with bloating.  Initial labs drawn today. Pap smear collected. Has anatomy scan scheduled. Follow up in 4 weeks for routine OB visit. - Culture, OB Urine - CBC/D/Plt+RPR+Rh+ABO+RubIgG... - Hemoglobin A1c - Cytology - PAP( [place]) - Panorama Prenatal Test Full Panel  History of pre-eclampsia  We discussed risk of recurrence of hypertension in pregnancy. Recommend starting  baby aspirin from now. Rx sent. - has blood pressure cuff at home. She will enroll in babyscripts. - Plan: aspirin EC 81 MG tablet,  - Enroll patient in Babyscripts Program  Obesity in pregnancy BMI >45. Early diabetes screening ordered. Recommended TWG: 11 - 20 lbs  Headache  Discussed extra rest at night, hydration.  Will send flexeril for prn use.  Initial labs drawn. Continue prenatal vitamins. Problem list reviewed and updated. Genetic Screening discussed, Panorama: ordered. Ultrasound discussed; fetal anatomic survey: scheduled. Anticipatory guidance about prenatal visits given including labs, ultrasounds, and testing.  Discussed usage of the Babyscripts app for more information about pregnancy, and to track blood pressures. Also discussed usage of virtual visits as additional source of managing and completing prenatal visits.  Patient was encouraged to use MyChart to review results, send requests, and have questions addressed.    The nature of [facility] with multiple MDs and Advanced Practice Providers was explained to patient; also emphasized that residents, students are part of our team. Routine obstetric precautions  reviewed. Encouraged to seek out care at our office or emergency room [facility] MAU preferred) for urgent and/or emergent concerns.  Liliane Channel MD MPH OB Fellow, [facility] 02/04/2022

## 2022-02-04 NOTE — Addendum Note (Signed)
Addended by: Stormy Card on: 02/04/2022 05:33 PM   Modules accepted: Orders

## 2022-02-05 LAB — CBC/D/PLT+RPR+RH+ABO+RUBIGG...
Antibody Screen: NEGATIVE
Basophils Absolute: 0 10*3/uL (ref 0.0–0.2)
Basos: 1 %
EOS (ABSOLUTE): 0 10*3/uL (ref 0.0–0.4)
Eos: 1 %
HCV Ab: NONREACTIVE
HIV Screen 4th Generation wRfx: NONREACTIVE
Hematocrit: 34.6 % (ref 34.0–46.6)
Hemoglobin: 11.4 g/dL (ref 11.1–15.9)
Hepatitis B Surface Ag: NEGATIVE
Immature Grans (Abs): 0 10*3/uL (ref 0.0–0.1)
Immature Granulocytes: 0 %
Lymphocytes Absolute: 1.9 10*3/uL (ref 0.7–3.1)
Lymphs: 30 %
MCH: 29.8 pg (ref 26.6–33.0)
MCHC: 32.9 g/dL (ref 31.5–35.7)
MCV: 91 fL (ref 79–97)
Monocytes Absolute: 0.3 10*3/uL (ref 0.1–0.9)
Monocytes: 5 %
Neutrophils Absolute: 4.1 10*3/uL (ref 1.4–7.0)
Neutrophils: 63 %
Platelets: 366 10*3/uL (ref 150–450)
RBC: 3.82 x10E6/uL (ref 3.77–5.28)
RDW: 12.7 % (ref 11.7–15.4)
RPR Ser Ql: NONREACTIVE
Rh Factor: POSITIVE
Rubella Antibodies, IGG: 3.47 index (ref >0.99)
WBC: 6.3 10*3/uL (ref 3.4–10.8)

## 2022-02-05 LAB — HEMOGLOBIN A1C
Est. average glucose Bld gHb Est-mCnc: 100 mg/dL
Hgb A1c MFr Bld: 5.1 % (ref 4.8–5.6)

## 2022-02-05 LAB — HCV INTERPRETATION

## 2022-02-06 LAB — URINE CULTURE, OB REFLEX

## 2022-02-06 LAB — CULTURE, OB URINE

## 2022-02-07 DIAGNOSIS — Z419 Encounter for procedure for purposes other than remedying health state, unspecified: Secondary | ICD-10-CM | POA: Diagnosis not present

## 2022-02-09 LAB — CYTOLOGY - PAP
Chlamydia: NEGATIVE
Comment: NEGATIVE
Comment: NEGATIVE
Comment: NORMAL
Diagnosis: NEGATIVE
Neisseria Gonorrhea: NEGATIVE
Trichomonas: NEGATIVE

## 2022-02-10 LAB — PANORAMA PRENATAL TEST FULL PANEL:PANORAMA TEST PLUS 5 ADDITIONAL MICRODELETIONS: FETAL FRACTION: 5.6

## 2022-03-04 ENCOUNTER — Telehealth (INDEPENDENT_AMBULATORY_CARE_PROVIDER_SITE_OTHER): Payer: BC Managed Care – PPO | Admitting: Family Medicine

## 2022-03-04 DIAGNOSIS — Z8759 Personal history of other complications of pregnancy, childbirth and the puerperium: Secondary | ICD-10-CM

## 2022-03-04 DIAGNOSIS — Z3A17 17 weeks gestation of pregnancy: Secondary | ICD-10-CM

## 2022-03-04 DIAGNOSIS — Z3480 Encounter for supervision of other normal pregnancy, unspecified trimester: Secondary | ICD-10-CM

## 2022-03-04 DIAGNOSIS — O09292 Supervision of pregnancy with other poor reproductive or obstetric history, second trimester: Secondary | ICD-10-CM

## 2022-03-04 MED ORDER — PRENATAL GUMMIES 0.18-25 MG PO CHEW: 1.0000 [IU] | CHEWABLE_TABLET | Freq: Every day | ORAL | 2 refills | Status: DC

## 2022-03-04 NOTE — Progress Notes (Signed)
I connected with  Alexandra Collins on 03/04/22 at  3:15 PM EDT by telephone and verified that I am speaking with the correct person using two identifiers.  Reports right hip pain, radiating from hip to tailbone. Describes a similar pain at the end of her last pregnancy.  Unable to check BP at work. Will check when she returns home and log into Babyscripts.  Annabell Howells, RN 03/04/2022  3:13 PM

## 2022-03-04 NOTE — Progress Notes (Signed)
   OBSTETRICS PRENATAL VIRTUAL VISIT ENCOUNTER NOTE  Provider location: [facility] [place]   Patient location: Home  I connected with [name] on 03/04/22 at  3:15 PM EDT by MyChart Video Encounter and verified that I am speaking with the correct person using two identifiers. I discussed the limitations, risks, security and privacy concerns of performing an evaluation and management service virtually and the availability of in person appointments. I also discussed with the patient that there may be a patient responsible charge related to this service. The patient expressed understanding and agreed to proceed. Subjective:  Alexandra Collins is a 26 y.o. G3P1011 at [redacted]w[redacted]d being seen today for ongoing prenatal care.  She is currently monitored for the following issues for this low-risk pregnancy and has History of gestational hypertension; History of pre-eclampsia; Suppurative hidradenitis; Foot pain; Acne; Miscarriage; and Supervision of other normal pregnancy, antepartum on their problem list.  Patient reports backache and hip pain .  Contractions: Not present. Vag. Bleeding: None.  Movement: Absent. Denies any leaking of fluid.   The following portions of the patient's history were reviewed and updated as appropriate: allergies, current medications, past family history, past medical history, past social history, past surgical history and problem list.   Objective:  There were no vitals filed for this visit.  Fetal Status:     Movement: Absent     General:  Alert, oriented and cooperative. Patient is in no acute distress.  Respiratory: Normal respiratory effort, no problems with respiration noted  Mental Status: Normal mood and affect. Normal behavior. Normal judgment and thought content.  Rest of physical exam deferred due to type of encounter  Imaging: No results found.  Assessment and Plan:  Pregnancy: G3P1011 at [redacted]w[redacted]d 1. Supervision of other normal  pregnancy, antepartum Refilled her PNV--taking without issue with her shellfish allergy Normal complaints of pregnancy, maternity belt and heat and tylenol. Consider massage and PT if needed. - Prenatal MV & Min w/FA-DHA (PRENATAL GUMMIES) 0.18-25 MG CHEW; Chew 1 Units by mouth daily.  Dispense: 90 tablet; Refill: 2  2. History of pre-eclampsia Begin ASA  General obstetric precautions including but not limited to vaginal bleeding, contractions, leaking of fluid and fetal movement were reviewed in detail with the patient. I discussed the assessment and treatment plan with the patient. The patient was provided an opportunity to ask questions and all were answered. The patient agreed with the plan and demonstrated an understanding of the instructions. The patient was advised to call back or seek an in-person office evaluation/go to MAU at [facility] for any urgent or concerning symptoms. Please refer to After Visit Summary for other counseling recommendations.   I provided 9 minutes of face-to-face time during this encounter.  Return in 4 weeks (on 04/01/2022) for [facility], virtual.  Future Appointments  Date Time Provider [place]  03/16/2022  2:30 PM [facility] NURSE [facility]  03/16/2022  2:45 PM [facility] [place], MD [facility] [employer], [place]

## 2022-03-10 DIAGNOSIS — Z419 Encounter for procedure for purposes other than remedying health state, unspecified: Secondary | ICD-10-CM | POA: Diagnosis not present

## 2022-03-16 ENCOUNTER — Ambulatory Visit: Payer: BC Managed Care – PPO | Admitting: *Deleted

## 2022-03-16 ENCOUNTER — Ambulatory Visit: Payer: BC Managed Care – PPO | Attending: Family Medicine

## 2022-03-16 ENCOUNTER — Encounter: Payer: Self-pay | Admitting: *Deleted

## 2022-03-16 ENCOUNTER — Telehealth: Payer: Self-pay | Admitting: *Deleted

## 2022-03-16 ENCOUNTER — Other Ambulatory Visit: Payer: Self-pay | Admitting: *Deleted

## 2022-03-16 VITALS — BP 131/65 | HR 72

## 2022-03-16 DIAGNOSIS — Z363 Encounter for antenatal screening for malformations: Secondary | ICD-10-CM | POA: Insufficient documentation

## 2022-03-16 DIAGNOSIS — Z3480 Encounter for supervision of other normal pregnancy, unspecified trimester: Secondary | ICD-10-CM | POA: Insufficient documentation

## 2022-03-16 DIAGNOSIS — Z3A19 19 weeks gestation of pregnancy: Secondary | ICD-10-CM | POA: Insufficient documentation

## 2022-03-16 DIAGNOSIS — O99212 Obesity complicating pregnancy, second trimester: Secondary | ICD-10-CM | POA: Insufficient documentation

## 2022-03-16 DIAGNOSIS — O09292 Supervision of pregnancy with other poor reproductive or obstetric history, second trimester: Secondary | ICD-10-CM | POA: Insufficient documentation

## 2022-03-16 DIAGNOSIS — Z362 Encounter for other antenatal screening follow-up: Secondary | ICD-10-CM | POA: Insufficient documentation

## 2022-03-16 DIAGNOSIS — O10912 Unspecified pre-existing hypertension complicating pregnancy, second trimester: Secondary | ICD-10-CM | POA: Insufficient documentation

## 2022-03-16 NOTE — Telephone Encounter (Signed)
I called Alexandra Collins to discuss Babyscripts alert of 156/99. She states that was either Monday or Sunday evening and she was not having any symptoms. She states she rechecked it an hour later and didn't record, but It was 134/88. She denies any symptoms today. We discussed her calling  us if elevated if office open. We also discussed if elevated and symptoms and office closed to go to hospital for evaluation. She voices understanding. Staci Acosta

## 2022-03-26 ENCOUNTER — Other Ambulatory Visit: Payer: Self-pay

## 2022-03-26 ENCOUNTER — Other Ambulatory Visit (HOSPITAL_BASED_OUTPATIENT_CLINIC_OR_DEPARTMENT_OTHER): Payer: Self-pay | Admitting: Obstetrics & Gynecology

## 2022-03-26 ENCOUNTER — Encounter (HOSPITAL_COMMUNITY): Payer: Self-pay | Admitting: Obstetrics and Gynecology

## 2022-03-26 ENCOUNTER — Telehealth: Payer: Self-pay

## 2022-03-26 ENCOUNTER — Inpatient Hospital Stay (HOSPITAL_COMMUNITY)
Admission: AD | Admit: 2022-03-26 | Discharge: 2022-03-26 | Disposition: A | Discharge status: Home / Self Care | Payer: BC Managed Care – PPO | Attending: Obstetrics and Gynecology | Admitting: Obstetrics and Gynecology

## 2022-03-26 DIAGNOSIS — O09292 Supervision of pregnancy with other poor reproductive or obstetric history, second trimester: Secondary | ICD-10-CM | POA: Diagnosis not present

## 2022-03-26 DIAGNOSIS — R002 Palpitations: Secondary | ICD-10-CM | POA: Insufficient documentation

## 2022-03-26 DIAGNOSIS — R42 Dizziness and giddiness: Secondary | ICD-10-CM | POA: Diagnosis not present

## 2022-03-26 DIAGNOSIS — Z3A20 20 weeks gestation of pregnancy: Secondary | ICD-10-CM | POA: Insufficient documentation

## 2022-03-26 DIAGNOSIS — O26892 Other specified pregnancy related conditions, second trimester: Secondary | ICD-10-CM | POA: Insufficient documentation

## 2022-03-26 DIAGNOSIS — Z3480 Encounter for supervision of other normal pregnancy, unspecified trimester: Secondary | ICD-10-CM

## 2022-03-26 DIAGNOSIS — O10912 Unspecified pre-existing hypertension complicating pregnancy, second trimester: Secondary | ICD-10-CM | POA: Diagnosis present

## 2022-03-26 DIAGNOSIS — R030 Elevated blood-pressure reading, without diagnosis of hypertension: Secondary | ICD-10-CM | POA: Diagnosis not present

## 2022-03-26 DIAGNOSIS — Z8759 Personal history of other complications of pregnancy, childbirth and the puerperium: Secondary | ICD-10-CM

## 2022-03-26 LAB — CBC
HCT: 32.5 % — ABNORMAL LOW (ref 36.0–46.0)
Hemoglobin: 11.6 g/dL — ABNORMAL LOW (ref 12.0–15.0)
MCH: 31.4 pg (ref 26.0–34.0)
MCHC: 35.7 g/dL (ref 30.0–36.0)
MCV: 88.1 fL (ref 80.0–100.0)
Platelets: 318 10*3/uL (ref 150–400)
RBC: 3.69 MIL/uL — ABNORMAL LOW (ref 3.87–5.11)
RDW: 12.9 % (ref 11.5–15.5)
WBC: 8.4 10*3/uL (ref 4.0–10.5)
nRBC: 0 % (ref 0.0–0.2)

## 2022-03-26 LAB — COMPREHENSIVE METABOLIC PANEL
ALT: 11 U/L (ref 0–44)
AST: 16 U/L (ref 15–41)
Albumin: 3.1 g/dL — ABNORMAL LOW (ref 3.5–5.0)
Alkaline Phosphatase: 56 U/L (ref 38–126)
Anion gap: 8 (ref 5–15)
BUN: 6 mg/dL (ref 6–20)
CO2: 23 mmol/L (ref 22–32)
Calcium: 9.3 mg/dL (ref 8.9–10.3)
Chloride: 106 mmol/L (ref 98–111)
Creatinine, Ser: 0.65 mg/dL (ref 0.44–1.00)
GFR, Estimated: >60 mL/min (ref >60)
Glucose, Bld: 78 mg/dL (ref 70–99)
Potassium: 4 mmol/L (ref 3.5–5.1)
Sodium: 137 mmol/L (ref 135–145)
Total Bilirubin: 0.5 mg/dL (ref 0.3–1.2)
Total Protein: 6.6 g/dL (ref 6.5–8.1)

## 2022-03-26 LAB — PROTEIN / CREATININE RATIO, URINE
Creatinine, Urine: 263 mg/dL
Protein Creatinine Ratio: 0.05 mg/mg{Cre} (ref 0.00–0.15)
Total Protein, Urine: 12 mg/dL

## 2022-03-26 NOTE — MAU Note (Signed)
Alexandra Collins is a 26 y.o. at [redacted]w[redacted]d here in MAU reporting: intermittent heart palpitations and dizziness. Palpations began in October 2023 but has been more frequent today. Pt checked BP at home 149/90. Reports seeing spots earlier in the day accompanied by dizziness, clear vision now.  Pain score: none Vitals:   03/26/22 1636 03/26/22 1650  BP: (!) 117/57 118/60  Pulse: 73 80  Resp:  18  Temp:  98.4 F (36.9 C)  SpO2:  100%     FHT: 142 bpm

## 2022-03-26 NOTE — MAU Provider Note (Signed)
History     CSN: [id number]  Arrival date and time: 03/26/22 1552   Event Date/Time   First Provider Initiated Contact with Patient 03/26/22 1645      Chief Complaint  Patient presents with   Hypertension   Alexandra Collins is a 26 y.o.G3P1011 at [redacted]w[redacted]d who presents today with palpitations. She states that they started on 11/13 and have been getting worse. Today she started having them again and also felt dizzy. When she gets the palpitations. She states that it will happen for a second and then stop and then come back about 5 mins later. She also noted that her blood pressure has been higher since the palpitations started a few days ago. She has had a lot of stress at work recently as well.   She also reports that she has had high blood pressure off and on for the last few days. She has a history of pre-eclampsia with a prior pregnancy.   On review of the chart she has had some elevated blood pressures outside of pregnancy as well:  02/26/21 134/85 04/29/21 131/87 07/08/2021 15/107 10/15/2021 133/74 10/18/2021 133/78 Then you can see prior to 19 weeks with this pregnancy she has had several blood pressures that were >140/90.   Hypertension    OB History     Gravida  3   Para  1   Term  1   Preterm      AB  1   Living  1      SAB  1   IAB      Ectopic      Multiple  0   Live Births  1           Past Medical History:  Diagnosis Date   Anemia    Hydradenitis    Pregnancy induced hypertension     Past Surgical History:  Procedure Laterality Date   KNEE ARTHROSCOPY Right     Family History  Problem Relation Age of Onset   Hypertension Mother    Seizures Father    Hypertension Father     Social History   Tobacco Use   Smoking status: Never   Smokeless tobacco: Never  Vaping Use   Vaping Use: Never used  Substance Use Topics   Alcohol use: No   Drug use: No    Allergies:  Allergies  Allergen Reactions   Ceclor [Cefaclor] Hives    Elemental Sulfur Hives   Garlic Hives    Only allergic to raw garlic   Penicillins Hives    Has patient had a PCN reaction causing immediate rash, facial/tongue/throat swelling, SOB or lightheadedness with hypotension: Yes Has patient had a PCN reaction causing severe rash involving mucus membranes or skin necrosis: Yes Has patient had a PCN reaction that required hospitalization: No Has patient had a PCN reaction occurring within the last 10 years: No If all of the above answers are "NO", then may proceed with Cephalosporin use.    Shellfish Allergy Itching   Sulfamethoxazole Nausea And Vomiting   Augmentin [Amoxicillin-Pot Clavulanate] Rash   Other Itching    Medications Prior to Admission  Medication Sig Dispense Refill Last Dose   aspirin EC 81 MG tablet Take 1 tablet (81 mg total) by mouth daily. Swallow whole. 30 tablet 12 03/25/2022   Prenatal MV & Min w/FA-DHA (PRENATAL GUMMIES) 0.18-25 MG CHEW Chew 1 Units by mouth daily. 90 tablet 2 03/25/2022   cyclobenzaprine (FLEXERIL) 5 MG tablet Take 1-2 tablets (5-10  mg total) by mouth every 8 (eight) hours as needed for muscle spasms (hedache, backache). 30 tablet 1 More than a month   Prenatal Vit-Fe Fumarate-FA (MULTIVITAMIN-PRENATAL) 27-0.8 MG TABS tablet Take 1 tablet by mouth daily at 12 noon. (Patient not taking: Reported on 03/04/2022)       Review of Systems  All other systems reviewed and are negative.  Physical Exam   Blood pressure 118/60, pulse 80, temperature 98.4 F (36.9 C), temperature source Oral, resp. rate 17, last menstrual period 11/03/2021, SpO2 100 %.  Physical Exam Constitutional:      Appearance: She is well-developed.  HENT:     Head: Normocephalic.  Eyes:     Pupils: Pupils are equal, round, and reactive to light.  Cardiovascular:     Rate and Rhythm: Normal rate and regular rhythm.     Heart sounds: Normal heart sounds.  Pulmonary:     Effort: Pulmonary effort is normal. No respiratory distress.      Breath sounds: Normal breath sounds.  Abdominal:     Palpations: Abdomen is soft.     Tenderness: There is no abdominal tenderness.  Genitourinary:    Vagina: No bleeding. Vaginal discharge: mucusy.    Comments: External: no lesion Vagina: small amount of white discharge     Musculoskeletal:        General: Normal range of motion.     Cervical back: Normal range of motion and neck supple.  Skin:    General: Skin is warm and dry.  Neurological:     Mental Status: She is alert and oriented to person, place, and time.  Psychiatric:        Mood and Affect: Mood normal.        Behavior: Behavior normal.   FHT 142 with doppler   Patient Vitals for the past 24 hrs:  BP Temp Temp src Pulse Resp SpO2  03/26/22 1715 (!) 104/56 -- -- 74 -- 100 %  03/26/22 1700 (!) 109/58 -- -- 71 -- 100 %  03/26/22 1650 118/60 98.4 F (36.9 C) Oral 80 17 100 %  03/26/22 1636 (!) 117/57 -- -- 90 -- --   Results for orders placed or performed during the hospital encounter of 03/26/22 (from the past 24 hour(s))  CBC     Status: Abnormal   Collection Time: 03/26/22  4:25 PM  Result Value Ref Range   WBC 8.4 4.0 - 10.5 K/uL   RBC 3.69 (L) 3.87 - 5.11 MIL/uL   Hemoglobin 11.6 (L) 12.0 - 15.0 g/dL   HCT 40.9 (L) 81.1 - 91.4 %   MCV 88.1 80.0 - 100.0 fL   MCH 31.4 26.0 - 34.0 pg   MCHC 35.7 30.0 - 36.0 g/dL   RDW 78.2 95.6 - 21.3 %   Platelets 318 150 - 400 K/uL   nRBC 0.0 0.0 - 0.2 %  Comprehensive metabolic panel     Status: Abnormal   Collection Time: 03/26/22  4:25 PM  Result Value Ref Range   Sodium 137 135 - 145 mmol/L   Potassium 4.0 3.5 - 5.1 mmol/L   Chloride 106 98 - 111 mmol/L   CO2 23 22 - 32 mmol/L   Glucose, Bld 78 70 - 99 mg/dL   BUN 6 6 - 20 mg/dL   Creatinine, Ser 0.86 0.44 - 1.00 mg/dL   Calcium 9.3 8.9 - 57.8 mg/dL   Total Protein 6.6 6.5 - 8.1 g/dL   Albumin 3.1 (L) 3.5 - 5.0 g/dL  AST 16 15 - 41 U/L   ALT 11 0 - 44 U/L   Alkaline Phosphatase 56 38 - 126 U/L    Total Bilirubin 0.5 0.3 - 1.2 mg/dL   GFR, Estimated >59 >16 mL/min   Anion gap 8 5 - 15  Protein / creatinine ratio, urine     Status: None   Collection Time: 03/26/22  4:30 PM  Result Value Ref Range   Creatinine, Urine 263 mg/dL   Total Protein, Urine 12 mg/dL   Protein Creatinine Ratio 0.05 0.00 - 0.15 mg/mg[Cre]     MAU Course  Procedures  MDM EKG: normal sinus rhythm  Assessment and Plan   1. Supervision of other normal pregnancy, antepartum   2. Elevated blood pressure reading without diagnosis of hypertension   3. [redacted] weeks gestation of pregnancy   4. History of gestational hypertension   5. History of pre-eclampsia   6. Palpitations     Patient likely has CHTN that has not been diagnosed. She is  now over 20 weeks, so that will confuse the issue somewhat. However, due to the likely chronic hypertension and palpitations we will refer her to cardio obstetrics for consultation and evaluation.    DC home in stable condition  2nd/3rd Trimester precautions  PTL precautions  Fetal kick counts RX: amb referral to Cardio Obstetrics  Return to MAU as needed FU with OB as planned   Follow-up Information     Tobb, Kardie, DO Follow up.   Specialty: Cardiology Why: They will call you with an appointment Contact information: [address] [place] [address] [phone]         [facility] Follow up.   Specialty: Obstetrics and Gynecology Why: As scheduled Contact information: [address] [place] [address] [phone]               Thressa Sheller DNP, CNM  03/26/22  6:03 PM

## 2022-03-26 NOTE — Telephone Encounter (Signed)
Called pt d/t elevated BP entered into BabyScripts App. VM left. Will send pt MyChart message as well.

## 2022-04-05 ENCOUNTER — Encounter: Payer: BC Managed Care – PPO | Admitting: Obstetrics and Gynecology

## 2022-04-09 DIAGNOSIS — Z419 Encounter for procedure for purposes other than remedying health state, unspecified: Secondary | ICD-10-CM | POA: Diagnosis not present

## 2022-04-11 DIAGNOSIS — I959 Hypotension, unspecified: Secondary | ICD-10-CM | POA: Diagnosis not present

## 2022-04-11 DIAGNOSIS — R002 Palpitations: Secondary | ICD-10-CM | POA: Diagnosis not present

## 2022-04-11 DIAGNOSIS — R6889 Other general symptoms and signs: Secondary | ICD-10-CM | POA: Diagnosis not present

## 2022-04-11 DIAGNOSIS — Z743 Need for continuous supervision: Secondary | ICD-10-CM | POA: Diagnosis not present

## 2022-04-11 IMAGING — CR DG CHEST 2V
2 series · 2 of 2 positions shown · non-contrast
Comparison: 07/31/2019

CLINICAL DATA: Left-sided chest pain for several hours

EXAM:
CHEST - 2 VIEW

[chest pa]
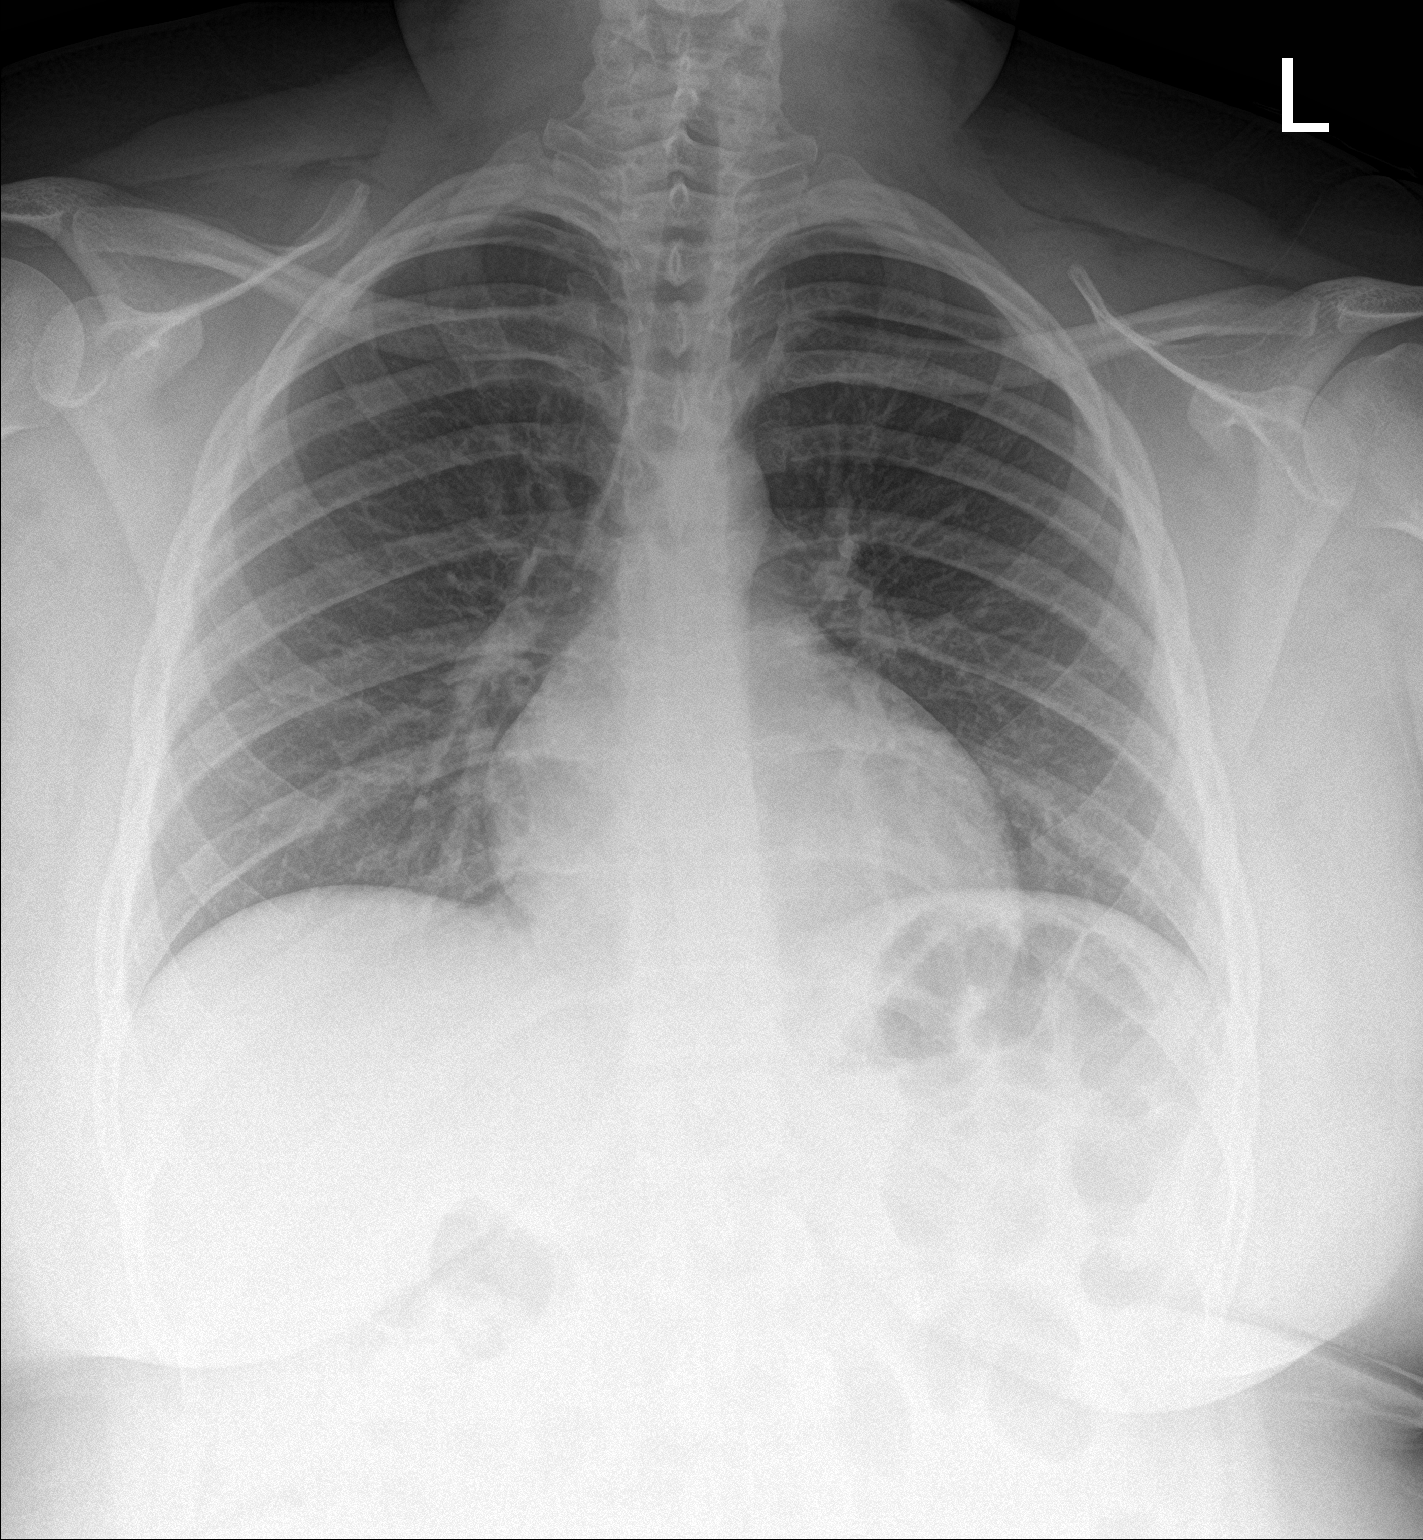

[chest lat]
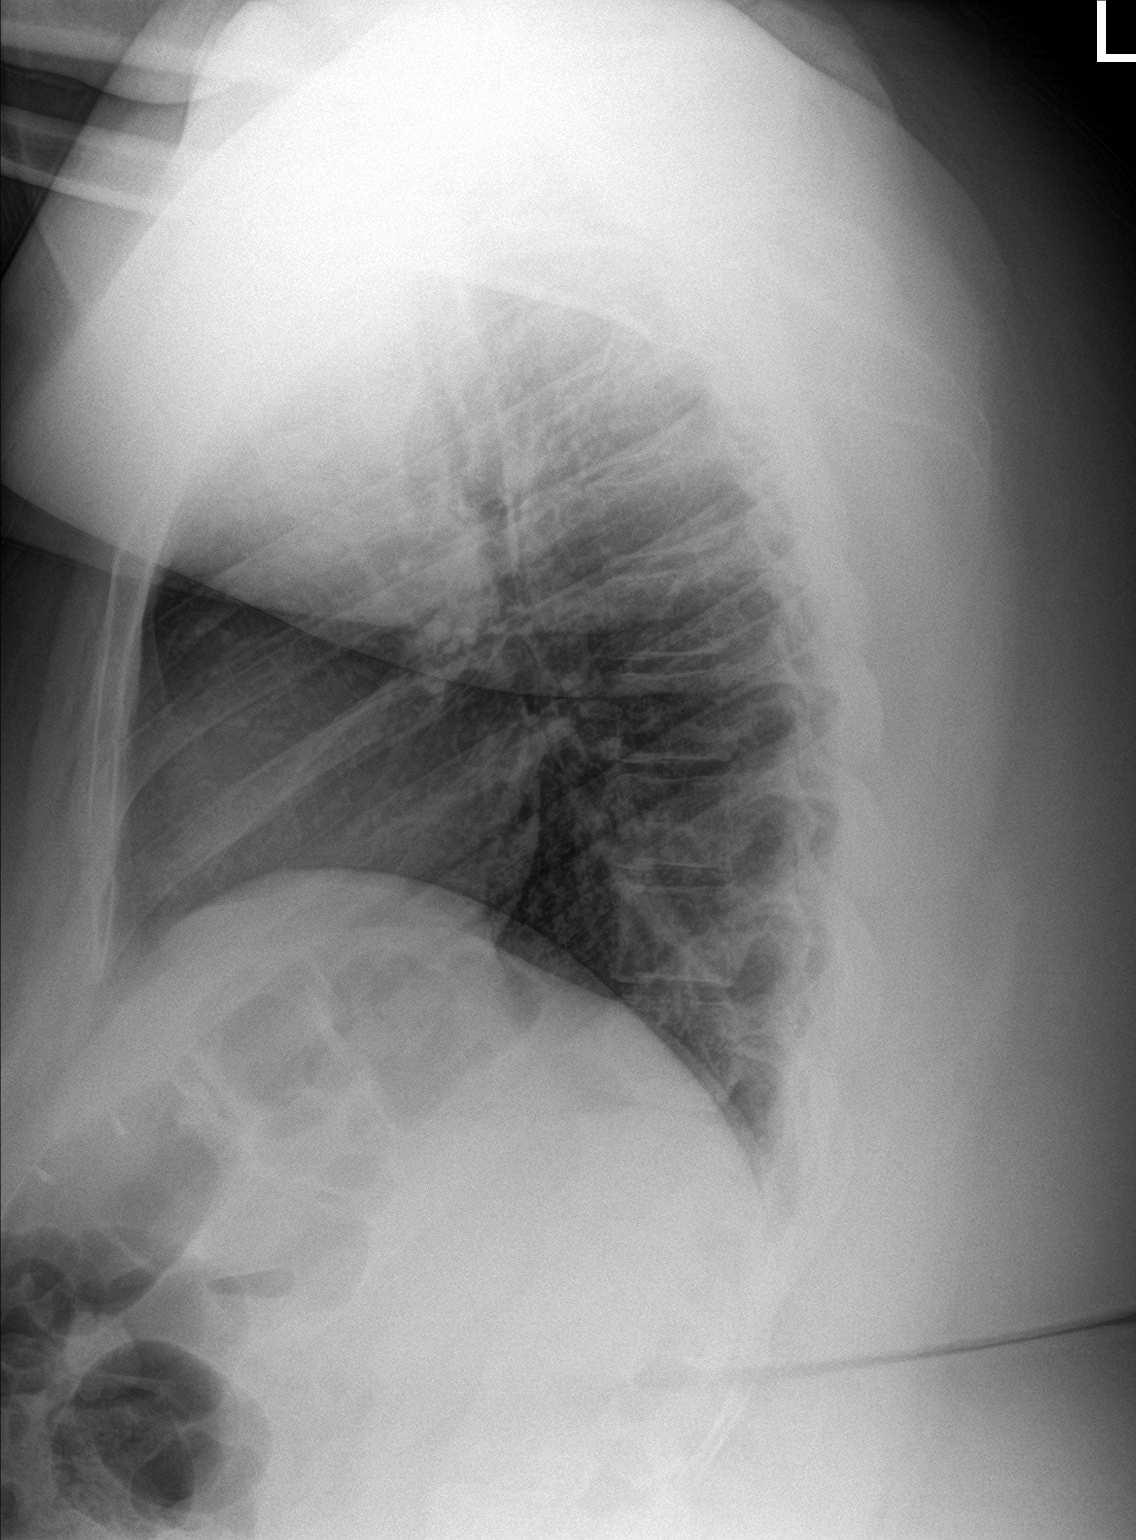

[2 of 2 positions shown; findings below may reference images not displayed]

FINDINGS: The heart size and mediastinal contours are within normal limits.
Both lungs are clear. The visualized skeletal structures are
unremarkable.
IMPRESSION: No active cardiopulmonary disease.

## 2022-04-12 ENCOUNTER — Ambulatory Visit: Payer: BC Managed Care – PPO | Attending: Obstetrics

## 2022-04-12 ENCOUNTER — Telehealth: Payer: Self-pay

## 2022-04-12 ENCOUNTER — Encounter: Payer: Self-pay | Admitting: *Deleted

## 2022-04-12 ENCOUNTER — Ambulatory Visit: Payer: BC Managed Care – PPO | Admitting: *Deleted

## 2022-04-12 DIAGNOSIS — O09292 Supervision of pregnancy with other poor reproductive or obstetric history, second trimester: Secondary | ICD-10-CM | POA: Diagnosis not present

## 2022-04-12 DIAGNOSIS — E669 Obesity, unspecified: Secondary | ICD-10-CM | POA: Diagnosis not present

## 2022-04-12 DIAGNOSIS — Z362 Encounter for other antenatal screening follow-up: Secondary | ICD-10-CM | POA: Insufficient documentation

## 2022-04-12 DIAGNOSIS — Z3A22 22 weeks gestation of pregnancy: Secondary | ICD-10-CM

## 2022-04-12 DIAGNOSIS — O99212 Obesity complicating pregnancy, second trimester: Secondary | ICD-10-CM | POA: Insufficient documentation

## 2022-04-12 NOTE — Telephone Encounter (Signed)
Patient called office requesting change in her U/S appointment today with MFM--appointment originally scheduled today for 2:45PM.   Called patient at [phone]; patient informed me that she no longer needed to reschedule the appointment as she was able to find coverage for her at work. Patient verified she is able to and will be coming in for appointment today as scheduled.   Maureen Ralphs RN  04/12/22 at [phone]

## 2022-04-14 ENCOUNTER — Other Ambulatory Visit: Payer: Self-pay | Admitting: *Deleted

## 2022-04-14 DIAGNOSIS — O99212 Obesity complicating pregnancy, second trimester: Secondary | ICD-10-CM

## 2022-04-26 ENCOUNTER — Encounter: Payer: Self-pay | Admitting: *Deleted

## 2022-04-30 ENCOUNTER — Ambulatory Visit: Payer: BC Managed Care – PPO | Admitting: Cardiology

## 2022-05-10 ENCOUNTER — Encounter: Payer: Self-pay | Admitting: Obstetrics and Gynecology

## 2022-05-10 DIAGNOSIS — Z419 Encounter for procedure for purposes other than remedying health state, unspecified: Secondary | ICD-10-CM | POA: Diagnosis not present

## 2022-05-10 NOTE — L&D Delivery Note (Signed)
Delivery Note Alexandra Collins is a 27 y.o. G3P1011 at [redacted]w[redacted]d admitted for IOL for GHTN.   GBS Status:  Negative/-- (03/12 1700)  Labor course: Initial SVE: 1.5/60/-2. Augmentation with: AROM and Cytotec. She then progressed to complete.  ROM: 4h 89m with clear fluid  Birth: Delivery of a Live born female  Birth Weight:   APGAR: ,   Newborn Delivery   Birth date/time: 08/06/2021 04:15:00 Delivery type: Vaginal, Spontaneous        Called w/report that pt was 7cms w/urge to push. Anesthesia called to dose epidural.  Pt delivered precipitously with next contraction.  RN delivered.  No reported complications.  Simone Autry-Lott arrived in room just after delivery, CNM arrived a few minutes after that and assumed care. Delivered via spontaneous vaginal delivery (Presentation: OA ). Nuchal cord present: Yes.  Shoulders and body delivered in usual fashion. Infant placed directly on mom's abdomen for bonding/skin-to-skin, baby dried and stimulated. Cord clamped x 2 after 1 minute and cut by FOB.  Cord blood collected. Placenta delivered-Spontaneous  with 3 vessels . 20u Pitocin in 500cc LR given as a bolus prior delivery of placenta.  Fundus firm with massage. Placenta inspected and appears to be intact with a 3 VC.  Sponge and instrument count were correct x2.  Intrapartum complications:  None Anesthesia:  epidural Lacerations:  none Suture Repair:  EBL (mL):225.00     Mom to postpartum.  Baby to Couplet care / Skin to Skin. Placenta to Home with patient   Plans to Breastfeed Contraception: vasectomy Circumcision: wants inpatient  Note sent to [facility]: FT for pp visit.  Delivery Report:  Review the Delivery Report for details.     Signed: Christin Fudge, DNP,CNM 08/06/2022, 4:31 AM

## 2022-05-11 ENCOUNTER — Ambulatory Visit: Payer: BC Managed Care – PPO | Attending: Obstetrics

## 2022-05-11 ENCOUNTER — Ambulatory Visit: Payer: BC Managed Care – PPO | Admitting: *Deleted

## 2022-05-11 ENCOUNTER — Encounter: Payer: Self-pay | Admitting: *Deleted

## 2022-05-11 VITALS — BP 123/60 | HR 71

## 2022-05-11 DIAGNOSIS — O10912 Unspecified pre-existing hypertension complicating pregnancy, second trimester: Secondary | ICD-10-CM | POA: Insufficient documentation

## 2022-05-11 DIAGNOSIS — O09292 Supervision of pregnancy with other poor reproductive or obstetric history, second trimester: Secondary | ICD-10-CM | POA: Diagnosis not present

## 2022-05-11 DIAGNOSIS — Z3A27 27 weeks gestation of pregnancy: Secondary | ICD-10-CM | POA: Diagnosis not present

## 2022-05-11 DIAGNOSIS — O99212 Obesity complicating pregnancy, second trimester: Secondary | ICD-10-CM | POA: Diagnosis present

## 2022-05-11 DIAGNOSIS — Z3480 Encounter for supervision of other normal pregnancy, unspecified trimester: Secondary | ICD-10-CM | POA: Insufficient documentation

## 2022-05-12 ENCOUNTER — Other Ambulatory Visit: Payer: Self-pay | Admitting: *Deleted

## 2022-05-12 DIAGNOSIS — O99213 Obesity complicating pregnancy, third trimester: Secondary | ICD-10-CM

## 2022-05-12 DIAGNOSIS — Z8759 Personal history of other complications of pregnancy, childbirth and the puerperium: Secondary | ICD-10-CM

## 2022-05-19 ENCOUNTER — Telehealth: Payer: Self-pay | Admitting: General Practice

## 2022-05-19 ENCOUNTER — Inpatient Hospital Stay (HOSPITAL_COMMUNITY)
Admission: AD | Admit: 2022-05-19 | Discharge: 2022-05-19 | Disposition: A | Discharge status: Home / Self Care | Payer: BC Managed Care – PPO | Attending: Obstetrics and Gynecology | Admitting: Obstetrics and Gynecology

## 2022-05-19 ENCOUNTER — Other Ambulatory Visit: Payer: Self-pay

## 2022-05-19 ENCOUNTER — Ambulatory Visit (INDEPENDENT_AMBULATORY_CARE_PROVIDER_SITE_OTHER): Payer: BC Managed Care – PPO | Admitting: Family Medicine

## 2022-05-19 ENCOUNTER — Encounter (HOSPITAL_COMMUNITY): Payer: Self-pay | Admitting: Obstetrics and Gynecology

## 2022-05-19 VITALS — BP 130/79 | HR 85 | Wt 292.0 lb

## 2022-05-19 DIAGNOSIS — O99891 Other specified diseases and conditions complicating pregnancy: Secondary | ICD-10-CM | POA: Diagnosis not present

## 2022-05-19 DIAGNOSIS — Z3A28 28 weeks gestation of pregnancy: Secondary | ICD-10-CM | POA: Insufficient documentation

## 2022-05-19 DIAGNOSIS — R102 Pelvic and perineal pain: Secondary | ICD-10-CM | POA: Diagnosis not present

## 2022-05-19 DIAGNOSIS — O0933 Supervision of pregnancy with insufficient antenatal care, third trimester: Secondary | ICD-10-CM | POA: Diagnosis not present

## 2022-05-19 DIAGNOSIS — Z23 Encounter for immunization: Secondary | ICD-10-CM

## 2022-05-19 DIAGNOSIS — Z3480 Encounter for supervision of other normal pregnancy, unspecified trimester: Secondary | ICD-10-CM

## 2022-05-19 DIAGNOSIS — Z8759 Personal history of other complications of pregnancy, childbirth and the puerperium: Secondary | ICD-10-CM

## 2022-05-19 DIAGNOSIS — R109 Unspecified abdominal pain: Secondary | ICD-10-CM | POA: Diagnosis not present

## 2022-05-19 DIAGNOSIS — O26893 Other specified pregnancy related conditions, third trimester: Secondary | ICD-10-CM | POA: Diagnosis present

## 2022-05-19 DIAGNOSIS — Z0371 Encounter for suspected problem with amniotic cavity and membrane ruled out: Secondary | ICD-10-CM | POA: Diagnosis not present

## 2022-05-19 DIAGNOSIS — O99210 Obesity complicating pregnancy, unspecified trimester: Secondary | ICD-10-CM

## 2022-05-19 LAB — URINALYSIS, ROUTINE W REFLEX MICROSCOPIC
Bilirubin Urine: NEGATIVE
Glucose, UA: NEGATIVE mg/dL
Hgb urine dipstick: NEGATIVE
Ketones, ur: NEGATIVE mg/dL
Leukocytes,Ua: NEGATIVE
Nitrite: NEGATIVE
Protein, ur: NEGATIVE mg/dL
Specific Gravity, Urine: 1.02 (ref 1.005–1.030)
pH: 7 (ref 5.0–8.0)

## 2022-05-19 LAB — CBC
Hematocrit: 33.2 % — ABNORMAL LOW (ref 34.0–46.6)
Hemoglobin: 11 g/dL — ABNORMAL LOW (ref 11.1–15.9)
MCH: 30.9 pg (ref 26.6–33.0)
MCHC: 33.1 g/dL (ref 31.5–35.7)
MCV: 93 fL (ref 79–97)
Platelets: 388 10*3/uL (ref 150–450)
RBC: 3.56 x10E6/uL — ABNORMAL LOW (ref 3.77–5.28)
RDW: 12.4 % (ref 11.7–15.4)
WBC: 7.9 10*3/uL (ref 3.4–10.8)

## 2022-05-19 LAB — WET PREP, GENITAL
Clue Cells Wet Prep HPF POC: NONE SEEN
Sperm: NONE SEEN
Trich, Wet Prep: NONE SEEN
WBC, Wet Prep HPF POC: >=10 — AB (ref <10)
Yeast Wet Prep HPF POC: NONE SEEN

## 2022-05-19 NOTE — Telephone Encounter (Signed)
Called patient regarding mychart messages. Patient reports having a lot of mucous discharge like clear lubricant this morning around 6am and no discharge since then until about 930 which was watery. She also reports vaginal pain starting around 9 which was initially very painful but currently only a 4 with walking. Patient also reports some pressure as well. Offered appt for 2:35pm today & patient verbalized understanding. Discussed her going to MAU for evaluation prior to appt if discharge increased and became continuous or if her pain increased/became associated with contractions. Patient verbalized understanding.

## 2022-05-19 NOTE — Progress Notes (Signed)
Opened in error

## 2022-05-19 NOTE — MAU Provider Note (Signed)
History     [id number]  Arrival date and time: 05/19/22 1049    Chief Complaint  Patient presents with   Contractions   Rupture of Membranes     HPI Alexandra Collins is a 27 y.o. at [redacted]w[redacted]d by 8 wk [place] with PMHx notable for hx of PreE, hidradenitis, who presents for concern for leaking fluid.   Review of outside prenatal records from [facility] (in media tab): has not been seen in person since 01/2022. Labs up to date though needs third trimester labs.   Patient reports she had a significant amount of water clear discharge with no odor, similar to lubricant, early this morning Denies a large gush of fluid or continued leaking Since then has also had sharp pains in her vagina that last a few seconds followed by some cramping on and off Does not feel like contractions No burning or pain with urination No vaginal bleeding Endorses normal fetal movement No constipation  O/Positive/-- (09/28 1644)  OB History     Gravida  3   Para  1   Term  1   Preterm      AB  1   Living  1      SAB  1   IAB      Ectopic      Multiple  0   Live Births  1           Past Medical History:  Diagnosis Date   Anemia    Hydradenitis    Pregnancy induced hypertension     Past Surgical History:  Procedure Laterality Date   KNEE ARTHROSCOPY Right     Family History  Problem Relation Age of Onset   Hypertension Mother    Seizures Father    Hypertension Father     Social History   Socioeconomic History   Marital status: Married    Spouse name: Not on file   Number of children: Not on file   Years of education: Not on file   Highest education level: Bachelor's degree (e.g., BA, AB, BS)  Occupational History   Not on file  Tobacco Use   Smoking status: Never   Smokeless tobacco: Never  Vaping Use   Vaping Use: Never used  Substance and Sexual Activity   Alcohol use: No   Drug use: No   Sexual activity: Yes    Birth control/protection: None  Other Topics  Concern   Not on file  Social History Narrative   Not on file   Social Determinants of Health   Financial Resource Strain: Low Risk  (02/04/2022)   Overall Financial Resource Strain (CARDIA)    Difficulty of Paying Living Expenses: Not very hard  Food Insecurity: Food Insecurity Present (02/04/2022)   Hunger Vital Sign    Worried About Running Out of Food in the Last Year: Sometimes true    Ran Out of Food in the Last Year: Sometimes true  Transportation Needs: No Transportation Needs (02/04/2022)   PRAPARE - Administrator, Civil Service (Medical): No    Lack of Transportation (Non-Medical): No  Physical Activity: Unknown (02/04/2022)   Exercise Vital Sign    Days of Exercise per Week: 0 days    Minutes of Exercise per Session: Not on file  Stress: No Stress Concern Present (02/04/2022)   [employer] of Occupational Health - Occupational Stress Questionnaire    Feeling of Stress : Not at all  Social Connections: Moderately Integrated (02/04/2022)  Social Licensed conveyancer [NHANES]    Frequency of Communication with Friends and Family: More than three times a week    Frequency of Social Gatherings with Friends and Family: Twice a week    Attends Religious Services: More than 4 times per year    Active Member of [employer] or Organizations: No    Attends Music therapist: Not on file    Marital Status: Married  Human resources officer Violence: Not on file    Allergies  Allergen Reactions   Ceclor [Cefaclor] Hives   Elemental Sulfur Hives   Garlic Hives    Only allergic to raw garlic   Penicillins Hives    Has patient had a PCN reaction causing immediate rash, facial/tongue/throat swelling, SOB or lightheadedness with hypotension: Yes Has patient had a PCN reaction causing severe rash involving mucus membranes or skin necrosis: Yes Has patient had a PCN reaction that required hospitalization: No Has patient had a PCN reaction occurring within the  last 10 years: No If all of the above answers are "NO", then may proceed with Cephalosporin use.    Shellfish Allergy Itching   Sulfamethoxazole Nausea And Vomiting   Augmentin [Amoxicillin-Pot Clavulanate] Rash   Other Itching    No current facility-administered medications on file prior to encounter.   Current Outpatient Medications on File Prior to Encounter  Medication Sig Dispense Refill   Prenatal MV & Min w/FA-DHA (PRENATAL GUMMIES) 0.18-25 MG CHEW Chew 1 Units by mouth daily. 90 tablet 2   aspirin EC 81 MG tablet Take 1 tablet (81 mg total) by mouth daily. Swallow whole. 30 tablet 12   cyclobenzaprine (FLEXERIL) 5 MG tablet Take 1-2 tablets (5-10 mg total) by mouth every 8 (eight) hours as needed for muscle spasms (hedache, backache). 30 tablet 1   Prenatal Vit-Fe Fumarate-FA (MULTIVITAMIN-PRENATAL) 27-0.8 MG TABS tablet Take 1 tablet by mouth daily at 12 noon. (Patient not taking: Reported on 03/04/2022)     [DISCONTINUED] cetirizine (ZYRTEC ALLERGY) 10 MG tablet Take 1 tablet (10 mg total) by mouth daily. (Patient not taking: Reported on 03/12/2020) 30 tablet 0   [DISCONTINUED] fluticasone (FLONASE) 50 MCG/ACT nasal spray Place 1 spray into both nostrils daily. (Patient not taking: Reported on 03/12/2020) 16 g 0     ROS Pertinent positives and negative per HPI, all others reviewed and negative  Physical Exam   BP (!) 111/57 (BP Location: Right Arm)   Pulse 80   Temp 98.2 F (36.8 C) (Oral)   Resp 18   Ht 5' 6.5" (1.689 m)   Wt 130.6 kg   LMP 11/03/2021   SpO2 99%   BMI 45.79 kg/m   Patient Vitals for the past 24 hrs:  BP Temp Temp src Pulse Resp SpO2 Height Weight  05/19/22 1110 (!) 111/57 98.2 F (36.8 C) Oral 80 18 99 % -- --  05/19/22 1059 -- -- -- -- -- -- 5' 6.5" (1.689 m) 130.6 kg    Physical Exam Vitals reviewed.  Constitutional:      General: She is not in acute distress.    Appearance: She is well-developed. She is not diaphoretic.  Eyes:      General: No scleral icterus. Pulmonary:     Effort: Pulmonary effort is normal. No respiratory distress.  Abdominal:     General: There is no distension.     Palpations: Abdomen is soft.     Tenderness: There is no abdominal tenderness. There is no guarding or rebound.  Genitourinary:  Comments: External vagina unremarkable Limited view of vault but no pooling, scant white discharge Skin:    General: Skin is warm and dry.  Neurological:     Mental Status: She is alert.     Coordination: Coordination normal.      Cervical Exam    Bedside Ultrasound Not done  My interpretation: n/a  FHT Baseline 135, moderate variability, +accels (10x10, appropriate for gestational age), no decels Toco: flat Cat: I  Labs Results for orders placed or performed during the hospital encounter of 05/19/22 (from the past 24 hour(s))  Urinalysis, Routine w reflex microscopic Urine, Clean Catch     Status: Abnormal   Collection Time: 05/19/22 11:08 AM  Result Value Ref Range   Color, Urine YELLOW YELLOW   APPearance HAZY (A) CLEAR   Specific Gravity, Urine 1.020 1.005 - 1.030   pH 7.0 5.0 - 8.0   Glucose, UA NEGATIVE NEGATIVE mg/dL   Hgb urine dipstick NEGATIVE NEGATIVE   Bilirubin Urine NEGATIVE NEGATIVE   Ketones, ur NEGATIVE NEGATIVE mg/dL   Protein, ur NEGATIVE NEGATIVE mg/dL   Nitrite NEGATIVE NEGATIVE   Leukocytes,Ua NEGATIVE NEGATIVE    Imaging No results found.  MAU Course  Procedures Lab Orders         Wet prep, genital         Urinalysis, Routine w reflex microscopic Urine, Clean Catch    No orders of the defined types were placed in this encounter.  Imaging Orders  No imaging studies ordered today    MDM moderate  Assessment and Plan  #Evaluation for suspected rupture of membranes, with rupture of membranes not found #[redacted] weeks gestation of pregnancy Neg pool, neg fern on slide, ruled out for rupture. Likely leukorrhea of pregnancy. Wet prep pending, will send  results through mychart.   #Abdominal pain in pregnancy, third trimester Description c/w round ligament pain. No contractions on monitor, does not appear laborous.   #Scant prenatal care Misunderstood different between MFM and OB office, encouraged to attend prenatal appt that is scheduled for later today.   #FWB FHT Cat I NST: Reactive   Dispo: discharged to home in stable condition.    Clarnce Flock, MD/MPH 05/19/22 12:13 PM  Allergies as of 05/19/2022       Reactions   Ceclor [cefaclor] Hives   Elemental Sulfur Hives   Garlic Hives   Only allergic to raw garlic   Penicillins Hives   Has patient had a PCN reaction causing immediate rash, facial/tongue/throat swelling, SOB or lightheadedness with hypotension: Yes Has patient had a PCN reaction causing severe rash involving mucus membranes or skin necrosis: Yes Has patient had a PCN reaction that required hospitalization: No Has patient had a PCN reaction occurring within the last 10 years: No If all of the above answers are "NO", then may proceed with Cephalosporin use.   Shellfish Allergy Itching   Sulfamethoxazole Nausea And Vomiting   Augmentin [amoxicillin-pot Clavulanate] Rash   Other Itching        Medication List     TAKE these medications    aspirin EC 81 MG tablet Take 1 tablet (81 mg total) by mouth daily. Swallow whole.   cyclobenzaprine 5 MG tablet Commonly known as: FLEXERIL Take 1-2 tablets (5-10 mg total) by mouth every 8 (eight) hours as needed for muscle spasms (hedache, backache).   multivitamin-prenatal 27-0.8 MG Tabs tablet Take 1 tablet by mouth daily at 12 noon.   Prenatal Gummies 0.18-25 MG Chew Chew 1  Units by mouth daily.

## 2022-05-19 NOTE — Progress Notes (Signed)
   PRENATAL VISIT NOTE  Subjective:  Alexandra Collins is a 27 y.o. G3P1011 at [redacted]w[redacted]d being seen today for ongoing prenatal care.  She is currently monitored for the following issues for this high-risk pregnancy and has Obesity in pregnancy; History of gestational hypertension; History of pre-eclampsia; Suppurative hidradenitis; Foot pain; Acne; Miscarriage; and Supervision of other normal pregnancy, antepartum on their problem list.  Patient reports no complaints.  Contractions: Not present. Vag. Bleeding: None.  Movement: Present. Denies leaking of fluid.   The following portions of the patient's history were reviewed and updated as appropriate: allergies, current medications, past family history, past medical history, past social history, past surgical history and problem list.   Objective:   Vitals:   05/19/22 1425  BP: 130/79  Pulse: 85  Weight: 292 lb (132.5 kg)    Fetal Status: Fetal Heart Rate (bpm): 135 Fundal Height: 29 cm Movement: Present     General:  Alert, oriented and cooperative. Patient is in no acute distress.  Skin: Skin is warm and dry. No rash noted.   Cardiovascular: Normal heart rate noted  Respiratory: Normal respiratory effort, no problems with respiration noted  Abdomen: Soft, gravid, appropriate for gestational age.  Pain/Pressure: Present     Pelvic: Cervical exam deferred        Extremities: Normal range of motion.     Mental Status: Normal mood and affect. Normal behavior. Normal judgment and thought content.   Assessment and Plan:  Pregnancy: G3P1011 at [redacted]w[redacted]d 1. Supervision of other normal pregnancy, antepartum Seen in MAU this morning for possible rupture. Neg pooling and fern. LOF improved and has not reoccurred.   2. [redacted] weeks gestation of pregnancy - Follow in 2 weeks, not seen since September (thought MFM visit were her routine OB visits) encouraged to get 2nd trimester labs today and keep regular follow up visits. Needs ongoing conversation about  tubal ligation. Does not think she wants anymore children since she has a boy and a girl. Discussed today that it is permanent (she was thinking permanent v. Semi-permanent) and patient plans to discuss with partner.  - CBC - HIV antibody (with reflex) - RPR - Glucose Tolerance, 1 Hour - Tdap   3. History of pre-eclampsia -continue ASA.   4. Need for Tdap vaccination - Tdap vaccine greater than or equal to 7yo IM  5. Obesity in pregnancy Last growth scan reviewed. Wnl.   Preterm labor symptoms and general obstetric precautions including but not limited to vaginal bleeding, contractions, leaking of fluid and fetal movement were reviewed in detail with the patient. Please refer to After Visit Summary for other counseling recommendations.   No follow-ups on file.  Future Appointments  Date Time Provider [place]  05/24/2022  8:20 AM [facility]  05/24/2022  1:15 PM Woodroe Mode, MD [facility]  05/28/2022  4:00 PM Berniece Salines, DO [facility] None  06/18/2022  3:30 PM [facility] NURSE [facility]  06/18/2022  3:45 PM [facility] US5 [facility]  07/02/2022  2:30 PM [facility] NURSE [facility]  07/02/2022  2:45 PM [facility] US5 [facility]  07/09/2022  2:45 PM [facility] NURSE [facility]  07/09/2022  3:00 PM [facility] US1 [facility] [place]    Alexandra Fraiser Aut[place]-Lott, DO

## 2022-05-19 NOTE — MAU Note (Signed)
.  Alexandra Collins is a 27 y.o. at [redacted]w[redacted]d here in MAU reporting: a lot of discharge that was clear since 0530. Didn't notice anymore until 0930 when she noticed her underwear was wet. Has had a sharp pain off/on since 9:30. After the sharp pains she has been having ? Contractions and pelvic pressure. Reports decreased fetal movement today.  Onset of complaint: 0530 Pain score: 5/10 discomfort and tightness Vitals:   05/19/22 1110  BP: (!) 111/57  Pulse: 80  Resp: 18  Temp: 98.2 F (36.8 C)  SpO2: 99%     FHT:147 Lab orders placed from triage u/a:

## 2022-05-20 LAB — RPR: RPR Ser Ql: NONREACTIVE

## 2022-05-20 LAB — GLUCOSE TOLERANCE, 1 HOUR: Glucose, 1Hr PP: 91 mg/dL (ref 70–199)

## 2022-05-20 LAB — HIV ANTIBODY (ROUTINE TESTING W REFLEX): HIV Screen 4th Generation wRfx: NONREACTIVE

## 2022-05-24 ENCOUNTER — Other Ambulatory Visit: Payer: Self-pay

## 2022-05-24 ENCOUNTER — Other Ambulatory Visit: Payer: BC Managed Care – PPO

## 2022-05-24 ENCOUNTER — Encounter: Payer: Self-pay | Admitting: Obstetrics & Gynecology

## 2022-05-24 ENCOUNTER — Ambulatory Visit (INDEPENDENT_AMBULATORY_CARE_PROVIDER_SITE_OTHER): Payer: BC Managed Care – PPO | Admitting: Obstetrics & Gynecology

## 2022-05-24 VITALS — BP 132/63 | HR 90 | Wt 287.0 lb

## 2022-05-24 DIAGNOSIS — O99210 Obesity complicating pregnancy, unspecified trimester: Secondary | ICD-10-CM

## 2022-05-24 DIAGNOSIS — Z3480 Encounter for supervision of other normal pregnancy, unspecified trimester: Secondary | ICD-10-CM

## 2022-05-24 DIAGNOSIS — Z23 Encounter for immunization: Secondary | ICD-10-CM

## 2022-05-24 DIAGNOSIS — Z3A28 28 weeks gestation of pregnancy: Secondary | ICD-10-CM

## 2022-05-24 DIAGNOSIS — Z8759 Personal history of other complications of pregnancy, childbirth and the puerperium: Secondary | ICD-10-CM

## 2022-05-24 NOTE — Progress Notes (Signed)
   PRENATAL VISIT NOTE  Subjective:  Alexandra Collins is a 27 y.o. G3P1011 at [redacted]w[redacted]d being seen today for ongoing prenatal care.  She is currently monitored for the following issues for this high-risk pregnancy and has Obesity in pregnancy; History of gestational hypertension; History of pre-eclampsia; Suppurative hidradenitis; Foot pain; Acne; Miscarriage; and Supervision of other normal pregnancy, antepartum on their problem list.  Patient reports no complaints.  Contractions: Not present. Vag. Bleeding: None.  Movement: Present. Denies leaking of fluid.   The following portions of the patient's history were reviewed and updated as appropriate: allergies, current medications, past family history, past medical history, past social history, past surgical history and problem list.   Objective:   Vitals:   05/24/22 1325  BP: 132/63  Pulse: 90  Weight: 287 lb (130.2 kg)    Fetal Status: Fetal Heart Rate (bpm): 160   Movement: Present     General:  Alert, oriented and cooperative. Patient is in no acute distress.  Skin: Skin is warm and dry. No rash noted.   Cardiovascular: Normal heart rate noted  Respiratory: Normal respiratory effort, no problems with respiration noted  Abdomen: Soft, gravid, appropriate for gestational age.  Pain/Pressure: Absent     Pelvic: Cervical exam deferred        Extremities: Normal range of motion.     Mental Status: Normal mood and affect. Normal behavior. Normal judgment and thought content.   Assessment and Plan:  Pregnancy: G3P1011 at [redacted]w[redacted]d 1. Need for Tdap vaccination  - Tdap vaccine greater than or equal to 7yo IM  2. Supervision of other normal pregnancy, antepartum blood pressure ol today   Preterm labor symptoms and general obstetric precautions including but not limited to vaginal bleeding, contractions, leaking of fluid and fetal movement were reviewed in detail with the patient. Please refer to After Visit Summary for other counseling  recommendations.   Return in about 2 weeks (around 06/07/2022).  Future Appointments  Date Time Provider [place]  05/28/2022  4:00 PM Berniece Salines, DO [facility] None  06/18/2022  3:30 PM [facility] NURSE [facility]  06/18/2022  3:45 PM [facility] US5 [facility]  07/02/2022  2:30 PM [facility] NURSE [facility]  07/02/2022  2:45 PM [facility] US5 [facility]  07/09/2022  2:45 PM [facility] NURSE [facility]  07/09/2022  3:00 PM [facility] US1 [facility]    Emeterio Reeve, MD

## 2022-05-28 ENCOUNTER — Ambulatory Visit (INDEPENDENT_AMBULATORY_CARE_PROVIDER_SITE_OTHER): Payer: BC Managed Care – PPO | Admitting: Cardiology

## 2022-05-28 ENCOUNTER — Encounter: Payer: Self-pay | Admitting: Cardiology

## 2022-05-28 VITALS — BP 122/74 | HR 73 | Ht 66.5 in | Wt 292.6 lb

## 2022-05-28 DIAGNOSIS — Z3A28 28 weeks gestation of pregnancy: Secondary | ICD-10-CM | POA: Diagnosis not present

## 2022-05-28 DIAGNOSIS — O99210 Obesity complicating pregnancy, unspecified trimester: Secondary | ICD-10-CM | POA: Diagnosis not present

## 2022-05-28 DIAGNOSIS — O10919 Unspecified pre-existing hypertension complicating pregnancy, unspecified trimester: Secondary | ICD-10-CM

## 2022-05-28 NOTE — Progress Notes (Signed)
Cardio-Obstetrics Clinic  New Evaluation  Date:  05/29/2022   ID:  Alexandra Collins, DOB 05-08-1996, MRN [id number]  PCP:  Ermelinda Das, MD   [facility] Providers Cardiologist:  Thomasene Ripple, DO  Electrophysiologist:  None       Referring MD: Ermelinda Das, MD   Chief Complaint: " I have high blood pressure"  History of Present Illness:    Alexandra Collins is a 27 y.o. female [G3P1011] who is being seen today for the evaluation of hypertension in pregnancy at the request of Ermelinda Das, MD.   Medical history includes obesity, hypertension in pregnancy.   She was referred for elevated blood pressure.  Prior CV Studies Reviewed: The following studies were reviewed today: None  Past Medical History:  Diagnosis Date   Anemia    Hydradenitis    Pregnancy induced hypertension     Past Surgical History:  Procedure Laterality Date   KNEE ARTHROSCOPY Right       OB History     Gravida  3   Para  1   Term  1   Preterm      AB  1   Living  1      SAB  1   IAB      Ectopic      Multiple  0   Live Births  1               Current Medications: Current Meds  Medication Sig   aspirin EC 81 MG tablet Take 1 tablet (81 mg total) by mouth daily. Swallow whole.   Prenat MV-Min w/Fe-Folate-DHA (PRENATAL COMPLETE PO) Take by mouth.     Allergies:   Ceclor [cefaclor], Elemental sulfur, Garlic, Penicillins, Shellfish allergy, Sulfamethoxazole, Augmentin [amoxicillin-pot clavulanate], and Other   Social History   Socioeconomic History   Marital status: Married    Spouse name: Not on file   Number of children: Not on file   Years of education: Not on file   Highest education level: Bachelor's degree (e.g., BA, AB, BS)  Occupational History   Not on file  Tobacco Use   Smoking status: Never   Smokeless tobacco: Never  Vaping Use   Vaping Use: Never used  Substance and Sexual Activity   Alcohol use: No   Drug use: No   Sexual  activity: Yes    Birth control/protection: None  Other Topics Concern   Not on file  Social History Narrative   Not on file   Social Determinants of Health   Financial Resource Strain: Low Risk  (02/04/2022)   Overall Financial Resource Strain (CARDIA)    Difficulty of Paying Living Expenses: Not very hard  Food Insecurity: Food Insecurity Present (02/04/2022)   Hunger Vital Sign    Worried About Running Out of Food in the Last Year: Sometimes true    Ran Out of Food in the Last Year: Sometimes true  Transportation Needs: No Transportation Needs (02/04/2022)   PRAPARE - Administrator, Civil Service (Medical): No    Lack of Transportation (Non-Medical): No  Physical Activity: Unknown (02/04/2022)   Exercise Vital Sign    Days of Exercise per Week: 0 days    Minutes of Exercise per Session: Not on file  Stress: No Stress Concern Present (02/04/2022)   [employer] of Occupational Health - Occupational Stress Questionnaire    Feeling of Stress : Not at all  Social Connections: Moderately Integrated (02/04/2022)   Social  Connection and Isolation Panel [NHANES]    Frequency of Communication with Friends and Family: More than three times a week    Frequency of Social Gatherings with Friends and Family: Twice a week    Attends Religious Services: More than 4 times per year    Active Member of [employer] or Organizations: No    Attends Music therapist: Not on file    Marital Status: Married      Family History  Problem Relation Age of Onset   Hypertension Mother    Seizures Father    Hypertension Father       ROS:   Please see the history of present illness.     All other systems reviewed and are negative.   Labs/EKG Reviewed:    EKG:   EKG is was ordered today.  The ekg ordered today demonstrates   Recent Labs: 03/26/2022: ALT 11; BUN 6; Creatinine, Ser 0.65; Potassium 4.0; Sodium 137 05/19/2022: Hemoglobin 11.0; Platelets 388   Recent Lipid  Panel No results found for: "CHOL", "TRIG", "HDL", "CHOLHDL", "LDLCALC", "LDLDIRECT"  Physical Exam:    VS:  BP 122/74   Pulse 73   Ht 5' 6.5" (1.689 m)   Wt 132.7 kg   LMP 11/03/2021   SpO2 100%   BMI 46.52 kg/m     Wt Readings from Last 3 Encounters:  05/28/22 132.7 kg  05/24/22 130.2 kg  05/19/22 132.5 kg     GEN:  Well nourished, well developed in no acute distress HEENT: Normal NECK: No JVD; No carotid bruits LYMPHATICS: No lymphadenopathy CARDIAC: RRR, no murmurs, rubs, gallops RESPIRATORY:  Clear to auscultation without rales, wheezing or rhonchi  ABDOMEN: Soft, non-tender, non-distended MUSCULOSKELETAL:  No edema; No deformity  SKIN: Warm and dry NEUROLOGIC:  Alert and oriented x 3 PSYCHIATRIC:  Normal affect    Risk Assessment/Risk Calculators:     CARPREG II Risk Prediction Index Score:  1.  The patient's risk for a primary cardiac event is 5%.            ASSESSMENT & PLAN:    Chronic hypertensive  Morbid obesity   Her blood pressure is at target in the office today - I have asked her to take her blood pressure daily and send me information via mychart in 2 weeks   Patient Instructions  Medication Instructions:  Your physician recommends that you continue on your current medications as directed. Please refer to the Current Medication list given to you today.  Please take your blood pressure daily for 2 weeks and send in a MyChart message. Please include heart rates.   HOW TO TAKE YOUR BLOOD PRESSURE: Rest 5 minutes before taking your blood pressure. Don't smoke or drink caffeinated beverages for at least 30 minutes before. Take your blood pressure before (not after) you eat. Sit comfortably with your back supported and both feet on the floor (don't cross your legs). Elevate your arm to heart level on a table or a desk. Use the proper sized cuff. It should fit smoothly and snugly around your bare upper arm. There should be enough room to slip a  fingertip under the cuff. The bottom edge of the cuff should be 1 inch above the crease of the elbow. Ideally, take 3 measurements at one sitting and record the average.  *If you need a refill on your cardiac medications before your next appointment, please call your pharmacy*   Lab Work: None   Testing/Procedures: None   Follow-Up:  At [facility], you and your health needs are our priority.  As part of our continuing mission to provide you with exceptional heart care, we have created designated Provider Care Teams.  These Care Teams include your primary Cardiologist (physician) and Advanced Practice Providers (APPs -  Physician Assistants and Nurse Practitioners) who all work together to provide you with the care you need, when you need it.  We recommend signing up for the patient portal called "MyChart".  Sign up information is provided on this After Visit Summary.  MyChart is used to connect with patients for Virtual Visits (Telemedicine).  Patients are able to view lab/test results, encounter notes, upcoming appointments, etc.  Non-urgent messages can be sent to your provider as well.   To learn more about what you can do with MyChart, go to [email or web address].    Your next appointment:   8 week(s)  Provider:   Loistine Simas [facility] [address] [place], [place] [address]    Other Instructions     Dispo:  No follow-ups on file.   Medication Adjustments/Labs and Tests Ordered: Current medicines are reviewed at length with the patient today.  Concerns regarding medicines are outlined above.  Tests Ordered: No orders of the defined types were placed in this encounter.  Medication Changes: No orders of the defined types were placed in this encounter.

## 2022-05-28 NOTE — Patient Instructions (Signed)
Medication Instructions:  Your physician recommends that you continue on your current medications as directed. Please refer to the Current Medication list given to you today.  Please take your blood pressure daily for 2 weeks and send in a MyChart message. Please include heart rates.   HOW TO TAKE YOUR BLOOD PRESSURE: Rest 5 minutes before taking your blood pressure. Don't smoke or drink caffeinated beverages for at least 30 minutes before. Take your blood pressure before (not after) you eat. Sit comfortably with your back supported and both feet on the floor (don't cross your legs). Elevate your arm to heart level on a table or a desk. Use the proper sized cuff. It should fit smoothly and snugly around your bare upper arm. There should be enough room to slip a fingertip under the cuff. The bottom edge of the cuff should be 1 inch above the crease of the elbow. Ideally, take 3 measurements at one sitting and record the average.  *If you need a refill on your cardiac medications before your next appointment, please call your pharmacy*   Lab Work: None   Testing/Procedures: None   Follow-Up: At [facility], you and your health needs are our priority.  As part of our continuing mission to provide you with exceptional heart care, we have created designated Provider Care Teams.  These Care Teams include your primary Cardiologist (physician) and Advanced Practice Providers (APPs -  Physician Assistants and Nurse Practitioners) who all work together to provide you with the care you need, when you need it.  We recommend signing up for the patient portal called "MyChart".  Sign up information is provided on this After Visit Summary.  MyChart is used to connect with patients for Virtual Visits (Telemedicine).  Patients are able to view lab/test results, encounter notes, upcoming appointments, etc.  Non-urgent messages can be sent to your provider as well.   To learn more about what you  can do with MyChart, go to [email or web address].    Your next appointment:   8 week(s)  Provider:   Berniece Salines  [facility] [address] [place], [place] [address]    Other Instructions

## 2022-05-31 ENCOUNTER — Encounter: Payer: Self-pay | Admitting: Cardiology

## 2022-05-31 ENCOUNTER — Encounter: Payer: Self-pay | Admitting: Family Medicine

## 2022-05-31 DIAGNOSIS — O26899 Other specified pregnancy related conditions, unspecified trimester: Secondary | ICD-10-CM

## 2022-05-31 DIAGNOSIS — R1033 Periumbilical pain: Secondary | ICD-10-CM

## 2022-05-31 MED ORDER — NIFEDIPINE ER OSMOTIC RELEASE 30 MG PO TB24: 30.0000 mg | ORAL_TABLET | Freq: Every day | ORAL | 11 refills | Status: DC

## 2022-05-31 NOTE — Telephone Encounter (Signed)
Called pt, she is available the Feb 23rd for an in-person visit. Will talk to Dr. Harriet Masson about double booking that far out. Pt will need a video visit with needs to be seen sooner.

## 2022-06-01 ENCOUNTER — Ambulatory Visit (HOSPITAL_COMMUNITY)
Admission: RE | Admit: 2022-06-01 | Discharge: 2022-06-01 | Disposition: A | Discharge status: Home / Self Care | Payer: BC Managed Care – PPO | Source: Ambulatory Visit | Attending: Family Medicine | Admitting: Family Medicine

## 2022-06-01 DIAGNOSIS — O26899 Other specified pregnancy related conditions, unspecified trimester: Secondary | ICD-10-CM | POA: Diagnosis present

## 2022-06-01 DIAGNOSIS — R109 Unspecified abdominal pain: Secondary | ICD-10-CM | POA: Insufficient documentation

## 2022-06-01 DIAGNOSIS — R1033 Periumbilical pain: Secondary | ICD-10-CM | POA: Insufficient documentation

## 2022-06-02 ENCOUNTER — Ambulatory Visit (HOSPITAL_COMMUNITY): Payer: BC Managed Care – PPO

## 2022-06-07 ENCOUNTER — Encounter (HOSPITAL_COMMUNITY): Payer: Self-pay | Admitting: Family Medicine

## 2022-06-07 ENCOUNTER — Inpatient Hospital Stay (HOSPITAL_COMMUNITY)
Admission: AD | Admit: 2022-06-07 | Discharge: 2022-06-07 | Disposition: A | Discharge status: Home / Self Care | Payer: BC Managed Care – PPO | Attending: Family Medicine | Admitting: Family Medicine

## 2022-06-07 DIAGNOSIS — O26899 Other specified pregnancy related conditions, unspecified trimester: Secondary | ICD-10-CM

## 2022-06-07 DIAGNOSIS — M25552 Pain in left hip: Secondary | ICD-10-CM | POA: Insufficient documentation

## 2022-06-07 DIAGNOSIS — M25551 Pain in right hip: Secondary | ICD-10-CM | POA: Insufficient documentation

## 2022-06-07 DIAGNOSIS — R109 Unspecified abdominal pain: Secondary | ICD-10-CM | POA: Insufficient documentation

## 2022-06-07 DIAGNOSIS — Z3A30 30 weeks gestation of pregnancy: Secondary | ICD-10-CM | POA: Diagnosis not present

## 2022-06-07 DIAGNOSIS — Z3689 Encounter for other specified antenatal screening: Secondary | ICD-10-CM

## 2022-06-07 DIAGNOSIS — O26893 Other specified pregnancy related conditions, third trimester: Secondary | ICD-10-CM | POA: Insufficient documentation

## 2022-06-07 DIAGNOSIS — M545 Low back pain, unspecified: Secondary | ICD-10-CM | POA: Insufficient documentation

## 2022-06-07 LAB — URINALYSIS, ROUTINE W REFLEX MICROSCOPIC
Bilirubin Urine: NEGATIVE
Glucose, UA: NEGATIVE mg/dL
Hgb urine dipstick: NEGATIVE
Ketones, ur: 20 mg/dL — AB
Leukocytes,Ua: NEGATIVE
Nitrite: NEGATIVE
Protein, ur: 30 mg/dL — AB
Specific Gravity, Urine: 1.025 (ref 1.005–1.030)
pH: 5 (ref 5.0–8.0)

## 2022-06-07 LAB — WET PREP, GENITAL
Clue Cells Wet Prep HPF POC: NONE SEEN
Sperm: NONE SEEN
Trich, Wet Prep: NONE SEEN
WBC, Wet Prep HPF POC: >=10 — AB (ref <10)
Yeast Wet Prep HPF POC: NONE SEEN

## 2022-06-07 NOTE — MAU Provider Note (Signed)
History     CSN: [id number]  Arrival date and time: 06/07/22 1324   Event Date/Time   First Provider Initiated Contact with Patient 06/07/22 1518      Chief Complaint  Patient presents with   Abdominal Pain   Back Pain   Hip Pain   HPI Charonda Hefter is a 27 y.o. G3P1011 at [redacted]w[redacted]d who presents to MAU with chief complaint of abdominal pain and concern for contractions. This is a new concern, onset earlier today. Pain score is 6-7/10. She denies aggravating or alleviating factors. She has not taken medication for this complaint as she prefers to avoid medication. Patient voices concern that her symptoms may have been caused by consuming a large volume of aloe vera juice.   Patient also reports bilateral hip and low back pain.  These are recurrent concerns for her. She denies vaginal bleeding, leaking of fluid, decreased fetal movement, fever, falls, or recent illness.    OB History     Gravida  3   Para  1   Term  1   Preterm      AB  1   Living  1      SAB  1   IAB      Ectopic      Multiple  0   Live Births  1           Past Medical History:  Diagnosis Date   Anemia    Hydradenitis    Pregnancy induced hypertension     Past Surgical History:  Procedure Laterality Date   KNEE ARTHROSCOPY Right     Family History  Problem Relation Age of Onset   Hypertension Mother    Seizures Father    Hypertension Father     Social History   Tobacco Use   Smoking status: Never   Smokeless tobacco: Never  Vaping Use   Vaping Use: Never used  Substance Use Topics   Alcohol use: No   Drug use: No    Allergies:  Allergies  Allergen Reactions   Ceclor [Cefaclor] Hives   Elemental Sulfur Hives   Garlic Hives    Only allergic to raw garlic   Penicillins Hives    Has patient had a PCN reaction causing immediate rash, facial/tongue/throat swelling, SOB or lightheadedness with hypotension: Yes Has patient had a PCN reaction causing severe rash  involving mucus membranes or skin necrosis: Yes Has patient had a PCN reaction that required hospitalization: No Has patient had a PCN reaction occurring within the last 10 years: No If all of the above answers are "NO", then may proceed with Cephalosporin use.    Shellfish Allergy Itching   Sulfamethoxazole Nausea And Vomiting   Augmentin [Amoxicillin-Pot Clavulanate] Rash   Other Itching    Medications Prior to Admission  Medication Sig Dispense Refill Last Dose   aspirin EC 81 MG tablet Take 1 tablet (81 mg total) by mouth daily. Swallow whole. 30 tablet 12 06/06/2022   NIFEdipine (PROCARDIA-XL/NIFEDICAL-XL) 30 MG 24 hr tablet Take 1 tablet (30 mg total) by mouth daily. 30 tablet 11 06/06/2022   Prenat MV-Min w/Fe-Folate-DHA (PRENATAL COMPLETE PO) Take by mouth.   06/06/2022    Review of Systems  Gastrointestinal:  Positive for abdominal pain.  Genitourinary:  Positive for pelvic pain.  Musculoskeletal:  Positive for back pain.  All other systems reviewed and are negative.  Physical Exam   Blood pressure (!) 116/56, pulse 82, temperature 98.5 F (36.9 C), temperature source  Oral, resp. rate 17, height 5' 6.5" (1.689 m), weight 129.8 kg, last menstrual period 11/03/2021, SpO2 100 %.  Physical Exam Vitals and nursing note reviewed. Exam conducted with a chaperone present.  Constitutional:      General: She is not in acute distress.    Appearance: She is well-developed. She is obese. She is not ill-appearing.  Cardiovascular:     Rate and Rhythm: Normal rate.  Pulmonary:     Effort: Pulmonary effort is normal.  Abdominal:     Palpations: Abdomen is soft.     Tenderness: There is no abdominal tenderness.  Skin:    Capillary Refill: Capillary refill takes less than 2 seconds.  Neurological:     Mental Status: She is alert and oriented to person, place, and time.  Psychiatric:        Mood and Affect: Mood normal.        Behavior: Behavior normal.     MAU Course   Procedures  MDM  --Reactive tracing: baseline 135, mod var, + 15 x 15 accels, no decels --Toco: quiet  Orders Placed This Encounter  Procedures   Wet prep, genital   Urinalysis, Routine w reflex microscopic -Urine, Clean Catch   Nursing communication   Discharge patient   Patient Vitals for the past 24 hrs:  BP Temp Temp src Pulse Resp SpO2 Height Weight  06/07/22 1527 121/71 -- -- 90 -- -- -- --  06/07/22 1404 (!) 116/56 -- -- 82 -- 100 % -- --  06/07/22 1344 119/68 98.5 F (36.9 C) Oral 80 17 100 % 5' 6.5" (1.689 m) 129.8 kg   Results for orders placed or performed during the hospital encounter of 06/07/22 (from the past 24 hour(s))  Urinalysis, Routine w reflex microscopic -Urine, Clean Catch     Status: Abnormal   Collection Time: 06/07/22  2:29 PM  Result Value Ref Range   Color, Urine YELLOW YELLOW   APPearance HAZY (A) CLEAR   Specific Gravity, Urine 1.025 1.005 - 1.030   pH 5.0 5.0 - 8.0   Glucose, UA NEGATIVE NEGATIVE mg/dL   Hgb urine dipstick NEGATIVE NEGATIVE   Bilirubin Urine NEGATIVE NEGATIVE   Ketones, ur 20 (A) NEGATIVE mg/dL   Protein, ur 30 (A) NEGATIVE mg/dL   Nitrite NEGATIVE NEGATIVE   Leukocytes,Ua NEGATIVE NEGATIVE   RBC / HPF 0-5 0 - 5 RBC/hpf   WBC, UA 0-5 0 - 5 WBC/hpf   Bacteria, UA RARE (A) NONE SEEN   Squamous Epithelial / HPF 0-5 0 - 5 /HPF   Mucus PRESENT   Wet prep, genital     Status: Abnormal   Collection Time: 06/07/22  2:40 PM   Specimen: PATH Cytology Cervicovaginal Ancillary Only  Result Value Ref Range   Yeast Wet Prep HPF POC NONE SEEN NONE SEEN   Trich, Wet Prep NONE SEEN NONE SEEN   Clue Cells Wet Prep HPF POC NONE SEEN NONE SEEN   WBC, Wet Prep HPF POC >=10 (A) <10   Sperm NONE SEEN    Assessment and Plan  --27 y.o. G3P1011 at [redacted]w[redacted]d  --Reactive tracing, quiet toco --Cervical exam declined by patient --No acute findings in current encounter --Pain medication declined by patient --Discharge home in stable  condition  Darlina Rumpf, MSA, MSN, CNM

## 2022-06-07 NOTE — MAU Note (Signed)
Second attempt for urine collection after drinking cup of juice. Patient unable to leave a urine sample at this time. More PO fluids given.

## 2022-06-07 NOTE — MAU Note (Signed)
Alexandra Collins is a 28 y.o. at [redacted]w[redacted]d here in MAU reporting: experiencing cramping, back pain, hip pain and braxton hicks.  A couple had some aloe vera juice, was unaware she shouldn't be drinking that, was afraid that was the cause of her discomfort.  Denies bleeding, or LOF.  Reports +FM.   Onset of complaint: braxton hicks started yesterday after walking up 3 flights of stairs, everything else was this morning.  Pain score: 6/7 BP 119/68 P 80  R 17 T 98.5  SpO2  1005    FHT:140 Lab orders placed from triage:  urine

## 2022-06-07 NOTE — Discharge Instructions (Signed)
Safe Medications in Pregnancy   Acne: Benzoyl Peroxide Salicylic Acid  Backache/Headache: Tylenol: 2 regular strength every 4 hours OR              2 Extra strength every 6 hours  Colds/Coughs/Allergies: Benadryl (alcohol free) 25 mg every 6 hours as needed Breath right strips Claritin Cepacol throat lozenges Chloraseptic throat spray Cold-Eeze- up to three times per day Cough drops, alcohol free Flonase (by prescription only) Guaifenesin Mucinex Robitussin DM (plain only, alcohol free) Saline nasal spray/drops Sudafed (pseudoephedrine) & Actifed ** use only after [redacted] weeks gestation and if you do not have high blood pressure Tylenol Vicks Vaporub Zinc lozenges Zyrtec   Constipation: Colace Ducolax suppositories Fleet enema Glycerin suppositories Metamucil Milk of magnesia Miralax Senokot Smooth move tea  Diarrhea: Kaopectate Imodium A-D  *NO pepto Bismol  Hemorrhoids: Anusol Anusol HC Preparation H Tucks  Indigestion: Tums Maalox Mylanta Zantac  Pepcid  Insomnia: Benadryl (alcohol free) 25mg  every 6 hours as needed Tylenol PM Unisom, no Gelcaps  Leg Cramps: Tums MagGel  Nausea/Vomiting:  Bonine Dramamine Emetrol Ginger extract Sea bands Meclizine  Nausea medication to take during pregnancy:  Unisom (doxylamine succinate 25 mg tablets) Take one tablet daily at bedtime. If symptoms are not adequately controlled, the dose can be increased to a maximum recommended dose of two tablets daily (1/2 tablet in the morning, 1/2 tablet mid-afternoon and one at bedtime). Vitamin B6 100mg  tablets. Take one tablet twice a day (up to 200 mg per day).  Skin Rashes: Aveeno products Benadryl cream or 25mg  every 6 hours as needed Calamine Lotion 1% cortisone cream  Yeast infection: Gyne-lotrimin 7 Monistat 7   **If taking multiple medications, please check labels to avoid duplicating the same active ingredients **take medication as directed on  the label ** Do not exceed 4000 mg of tylenol in 24 hours **Do not take medications that contain aspirin or ibuprofen   Round Ligament Pain during Pregnancy Many women will experience a type of pain referred to as "round ligament pain" during their pregnancy. This is associated with abdominal pain or discomfort. Since any type of abdominal pain during pregnancy can be disconcerting, it is important to talk about round ligament pain to relieve any anxiety or fears you may have regarding the symptoms you are feeling. Round ligament pain is due to normal changes that take place in the body during pregnancy. It is caused by stretching of the round ligaments attached to the uterus. More commonly it occurs on the right side of the pelvis. Round Ligament: An Overview Typically in the non-pregnant state the uterus is about the size of an apple or pear. There are thick ligaments which hold the uterus in place in the abdomen, referred to as round ligaments. During pregnancy, your uterus will expand in size and weight, and the ligaments supporting it will have to stretch, becoming longer and thinner. As these ligaments pull and tug they may irritate nearby nerve fibers, which causes pain. The severity of the pain in some cases can seem extreme. Some common symptoms of round ligament pain include:  Ligament spasms or contractions/cramps that trigger a sharp pain typically on the right side of the abdomen.  Pain upon waking or suddenly rolling over in your sleep.  Pain in the abdomen that is sharp brought on by exercise or other vigorous activity. Similar Problems Round ligament pain is often mistaken for other medical conditions because the symptoms are similar. Acute abdominal pain during pregnancy may also be  a sign of other conditions including:  Abdominal cramps - Some abdominal pain is simply caused by change in bowel habits associated with pregnancy. Gas is a common problem that can cause  sharp, shooting pain. You should always seek out medical care if your pain is accompanied by fever, chills, pain upon urination or if you have difficulty walking. Further exams and tests will be conducted to ensure that you do not have a more serious condition. It is not uncommon for women with lower abdominal pain to have a urinary tract infection, thus you may also be asked for a urine sample. Treatment If all other conditions are ruled out you can treat your round ligament pain relatively easily. You may be advised to take some acetaminophen (Tylenol) to reduce the severity of any persistent pain and asked to reduce your activity level. You can apply a heating pad to the area of pain or take a warm bath. Lying on the opposite side of the pain may help as well. Most women will find relief from round ligament pain simply by altering their daily routines slightly. The good news is round ligament pain will disappear completely once you have given birth to your child!

## 2022-06-08 ENCOUNTER — Encounter: Payer: BC Managed Care – PPO | Admitting: Family Medicine

## 2022-06-08 LAB — GC/CHLAMYDIA PROBE AMP (~~LOC~~) NOT AT ARMC
Chlamydia: NEGATIVE
Comment: NEGATIVE
Comment: NORMAL
Neisseria Gonorrhea: NEGATIVE

## 2022-06-10 DIAGNOSIS — Z419 Encounter for procedure for purposes other than remedying health state, unspecified: Secondary | ICD-10-CM | POA: Diagnosis not present

## 2022-06-18 ENCOUNTER — Ambulatory Visit: Payer: BC Managed Care – PPO | Attending: Obstetrics

## 2022-06-18 ENCOUNTER — Ambulatory Visit: Payer: BC Managed Care – PPO

## 2022-06-18 ENCOUNTER — Other Ambulatory Visit: Payer: Self-pay | Admitting: Obstetrics

## 2022-06-18 VITALS — BP 115/75 | HR 81

## 2022-06-18 DIAGNOSIS — O99213 Obesity complicating pregnancy, third trimester: Secondary | ICD-10-CM

## 2022-06-18 DIAGNOSIS — O321XX0 Maternal care for breech presentation, not applicable or unspecified: Secondary | ICD-10-CM | POA: Diagnosis not present

## 2022-06-18 DIAGNOSIS — O10913 Unspecified pre-existing hypertension complicating pregnancy, third trimester: Secondary | ICD-10-CM | POA: Diagnosis not present

## 2022-06-18 DIAGNOSIS — O09293 Supervision of pregnancy with other poor reproductive or obstetric history, third trimester: Secondary | ICD-10-CM | POA: Insufficient documentation

## 2022-06-18 DIAGNOSIS — Z8759 Personal history of other complications of pregnancy, childbirth and the puerperium: Secondary | ICD-10-CM

## 2022-06-18 DIAGNOSIS — Z3480 Encounter for supervision of other normal pregnancy, unspecified trimester: Secondary | ICD-10-CM | POA: Diagnosis present

## 2022-06-18 DIAGNOSIS — O10013 Pre-existing essential hypertension complicating pregnancy, third trimester: Secondary | ICD-10-CM

## 2022-06-18 DIAGNOSIS — Z3A32 32 weeks gestation of pregnancy: Secondary | ICD-10-CM

## 2022-06-18 DIAGNOSIS — E669 Obesity, unspecified: Secondary | ICD-10-CM

## 2022-06-21 ENCOUNTER — Telehealth: Payer: Self-pay

## 2022-06-21 ENCOUNTER — Other Ambulatory Visit: Payer: Self-pay | Admitting: *Deleted

## 2022-06-21 ENCOUNTER — Ambulatory Visit: Payer: BC Managed Care – PPO | Attending: Cardiology | Admitting: Cardiology

## 2022-06-21 VITALS — BP 141/79 | HR 88 | Ht 66.0 in | Wt 284.0 lb

## 2022-06-21 DIAGNOSIS — O10919 Unspecified pre-existing hypertension complicating pregnancy, unspecified trimester: Secondary | ICD-10-CM

## 2022-06-21 DIAGNOSIS — O99213 Obesity complicating pregnancy, third trimester: Secondary | ICD-10-CM

## 2022-06-21 DIAGNOSIS — O10913 Unspecified pre-existing hypertension complicating pregnancy, third trimester: Secondary | ICD-10-CM | POA: Diagnosis not present

## 2022-06-21 DIAGNOSIS — O09299 Supervision of pregnancy with other poor reproductive or obstetric history, unspecified trimester: Secondary | ICD-10-CM

## 2022-06-21 DIAGNOSIS — Z8759 Personal history of other complications of pregnancy, childbirth and the puerperium: Secondary | ICD-10-CM

## 2022-06-21 DIAGNOSIS — Z3A33 33 weeks gestation of pregnancy: Secondary | ICD-10-CM

## 2022-06-21 DIAGNOSIS — O99210 Obesity complicating pregnancy, unspecified trimester: Secondary | ICD-10-CM

## 2022-06-21 MED ORDER — NIFEDIPINE ER OSMOTIC RELEASE 60 MG PO TB24: 60.0000 mg | ORAL_TABLET | Freq: Every day | ORAL | 3 refills | Status: DC

## 2022-06-21 NOTE — Telephone Encounter (Signed)
Called pt, went over AVS. New prescription sent into pharmacy. No further questions at this time.

## 2022-06-21 NOTE — Patient Instructions (Addendum)
Medication Instructions:  Your physician has recommended you make the following change in your medication:  INCREASE: Nifedipine 60 mg once daily *If you need a refill on your cardiac medications before your next appointment, please call your pharmacy*   Lab Work: None If you have labs (blood work) drawn today and your tests are completely normal, you will receive your results only by: [place] (if you have MyChart) OR A paper copy in the mail If you have any lab test that is abnormal or we need to change your treatment, we will call you to review the results.   Testing/Procedures: None   Follow-Up: At [facility], you and your health needs are our priority.  As part of our continuing mission to provide you with exceptional heart care, we have created designated Provider Care Teams.  These Care Teams include your primary Cardiologist (physician) and Advanced Practice Providers (APPs -  Physician Assistants and Nurse Practitioners) who all work together to provide you with the care you need, when you need it.  We recommend signing up for the patient portal called "MyChart".  Sign up information is provided on this After Visit Summary.  MyChart is used to connect with patients for Virtual Visits (Telemedicine).  Patients are able to view lab/test results, encounter notes, upcoming appointments, etc.  Non-urgent messages can be sent to your provider as well.   To learn more about what you can do with MyChart, go to [email or web address].    Your next appointment:   2 week(s) via MyChart  Provider:   Berniece Salines, DO

## 2022-06-21 NOTE — Progress Notes (Unsigned)
[facility]  Follow Up Note  Virtual Visit via Video  Note . I connected with the patient today by a   video enabled telemedicine application and verified that I am speaking with the correct person using two identifiers.   Date:  06/24/2022   ID:  Alexandra Collins, DOB [birth year], MRN [id number]  PCP:  Rhetta Mura, MD   [facility] Cardiologist:  Berniece Salines, DO  Electrophysiologist:  None        Referring MD: Rhetta Mura, MD   Chief Complaint: " I am ok"  History of Present Illness:    Alexandra Collins is a 27 y.o. female [G3P1011] who returns for follow up of   Medical history includes obesity, chronic hypertension in pregnancy.   At her last visit which was in January 2024-  Since her visit started the patient on nifedipine 30 mg daily.  Here for follow-up visit.  Prior CV Studies Reviewed: The following studies were reviewed today:   Past Medical History:  Diagnosis Date   Anemia    Hydradenitis    Pregnancy induced hypertension     Past Surgical History:  Procedure Laterality Date   KNEE ARTHROSCOPY Right       OB History     Gravida  3   Para  1   Term  1   Preterm      AB  1   Living  1      SAB  1   IAB      Ectopic      Multiple  0   Live Births  1               Current Medications: Current Meds  Medication Sig   NIFEdipine (PROCARDIA XL/NIFEDICAL XL) 60 MG 24 hr tablet Take 1 tablet (60 mg total) by mouth daily.   Prenat MV-Min w/Fe-Folate-DHA (PRENATAL COMPLETE PO) Take by mouth.   [DISCONTINUED] NIFEdipine (PROCARDIA-XL/NIFEDICAL-XL) 30 MG 24 hr tablet Take 1 tablet (30 mg total) by mouth daily.     Allergies:   Ceclor [cefaclor], Elemental sulfur, Garlic, Penicillins, Shellfish allergy, Sulfamethoxazole, Augmentin [amoxicillin-pot clavulanate], and Other   Social History   Socioeconomic History   Marital status: Married    Spouse name: Not on file   Number of children: Not  on file   Years of education: Not on file   Highest education level: Bachelor's degree (e.g., BA, AB, BS)  Occupational History   Not on file  Tobacco Use   Smoking status: Never   Smokeless tobacco: Never  Vaping Use   Vaping Use: Never used  Substance and Sexual Activity   Alcohol use: No   Drug use: No   Sexual activity: Yes    Birth control/protection: None  Other Topics Concern   Not on file  Social History Narrative   Not on file   Social Determinants of Health   Financial Resource Strain: Low Risk  (02/04/2022)   Overall Financial Resource Strain (CARDIA)    Difficulty of Paying Living Expenses: Not very hard  Food Insecurity: Food Insecurity Present (02/04/2022)   Hunger Vital Sign    Worried About Running Out of Food in the Last Year: Sometimes true    Ran Out of Food in the Last Year: Sometimes true  Transportation Needs: No Transportation Needs (02/04/2022)   PRAPARE - Hydrologist (Medical): No    Lack of Transportation (Non-Medical): No  Physical Activity: Unknown (02/04/2022)  Exercise Vital Sign    Days of Exercise per Week: 0 days    Minutes of Exercise per Session: Not on file  Stress: No Stress Concern Present (02/04/2022)   [place]    Feeling of Stress : Not at all  Social Connections: Moderately Integrated (02/04/2022)   Social Connection and Isolation Panel [NHANES]    Frequency of Communication with Friends and Family: More than three times a week    Frequency of Social Gatherings with Friends and Family: Twice a week    Attends Religious Services: More than 4 times per year    Active Member of [employer] or Organizations: No    Attends Music therapist: Not on file    Marital Status: Married      Family History  Problem Relation Age of Onset   Hypertension Mother    Asthma Father    Seizures Father    Hypertension Father    Asthma Sister     Asthma Maternal Aunt    Diabetes Neg Hx    Heart disease Neg Hx    Stroke Neg Hx       ROS:   Please see the history of present illness.     All other systems reviewed and are negative.   Labs/EKG Reviewed:    EKG:   EKG is was not ordered today.    Recent Labs: 03/26/2022: ALT 11; BUN 6; Creatinine, Ser 0.65; Potassium 4.0; Sodium 137 05/19/2022: Hemoglobin 11.0; Platelets 388   Recent Lipid Panel No results found for: "CHOL", "TRIG", "HDL", "CHOLHDL", "LDLCALC", "LDLDIRECT"  Physical Exam:    VS:  BP (!) 141/79   Pulse 88   Ht 5' 6"$  (1.676 m)   Wt 128.8 kg   LMP 11/03/2021   BMI 45.84 kg/m     Wt Readings from Last 3 Encounters:  06/22/22 130.7 kg  06/21/22 128.8 kg  06/07/22 129.8 kg       Risk Assessment/Risk Calculators:     CARPREG II Risk Prediction Index Score:  1.  The patient's risk for a primary cardiac event is 5%.            ASSESSMENT & PLAN:    Chronic hypertension pregnancy Morbid obesity  Hypertensive on nifedipine 30 mg daily.  I like to increase her nifedipine to 60 mg once daily. Her blood pressure daily and send me that information in 1 week she will need to adjust antihypertensive medication we will at that time.  I encouraged the patient to continue her aspirin 81 mg daily for preeclampsia prophylaxis.  Total time spent 15 minutes.   Patient Instructions  Medication Instructions:  Your physician has recommended you make the following change in your medication:  INCREASE: Nifedipine 60 mg once daily *If you need a refill on your cardiac medications before your next appointment, please call your pharmacy*   Lab Work: None If you have labs (blood work) drawn today and your tests are completely normal, you will receive your results only by: [place] (if you have MyChart) OR A paper copy in the mail If you have any lab test that is abnormal or we need to change your treatment, we will call you to review the  results.   Testing/Procedures: None   Follow-Up: At [facility], you and your health needs are our priority.  As part of our continuing mission to provide you with exceptional heart care, we have created designated Provider  Care Teams.  These Care Teams include your primary Cardiologist (physician) and Advanced Practice Providers (APPs -  Physician Assistants and Nurse Practitioners) who all work together to provide you with the care you need, when you need it.  We recommend signing up for the patient portal called "MyChart".  Sign up information is provided on this After Visit Summary.  MyChart is used to connect with patients for Virtual Visits (Telemedicine).  Patients are able to view lab/test results, encounter notes, upcoming appointments, etc.  Non-urgent messages can be sent to your provider as well.   To learn more about what you can do with MyChart, go to [email or web address].    Your next appointment:   2 week(s) via MyChart  Provider:   Berniece Salines, DO    Dispo:  No follow-ups on file.   Medication Adjustments/Labs and Tests Ordered: Current medicines are reviewed at length with the patient today.  Concerns regarding medicines are outlined above.  Tests Ordered: No orders of the defined types were placed in this encounter.  Medication Changes: Meds ordered this encounter  Medications   NIFEdipine (PROCARDIA XL/NIFEDICAL XL) 60 MG 24 hr tablet    Sig: Take 1 tablet (60 mg total) by mouth daily.    Dispense:  90 tablet    Refill:  3

## 2022-06-22 ENCOUNTER — Encounter: Payer: Self-pay | Admitting: Cardiology

## 2022-06-22 ENCOUNTER — Encounter: Payer: Self-pay | Admitting: Family Medicine

## 2022-06-22 ENCOUNTER — Ambulatory Visit (INDEPENDENT_AMBULATORY_CARE_PROVIDER_SITE_OTHER): Payer: BC Managed Care – PPO | Admitting: Family Medicine

## 2022-06-22 ENCOUNTER — Other Ambulatory Visit: Payer: Self-pay

## 2022-06-22 VITALS — BP 128/85 | HR 111 | Wt 288.2 lb

## 2022-06-22 DIAGNOSIS — O99210 Obesity complicating pregnancy, unspecified trimester: Secondary | ICD-10-CM

## 2022-06-22 DIAGNOSIS — Z8759 Personal history of other complications of pregnancy, childbirth and the puerperium: Secondary | ICD-10-CM

## 2022-06-22 DIAGNOSIS — Z3A33 33 weeks gestation of pregnancy: Secondary | ICD-10-CM

## 2022-06-22 DIAGNOSIS — O99213 Obesity complicating pregnancy, third trimester: Secondary | ICD-10-CM

## 2022-06-22 DIAGNOSIS — I10 Essential (primary) hypertension: Secondary | ICD-10-CM | POA: Insufficient documentation

## 2022-06-22 DIAGNOSIS — Z3483 Encounter for supervision of other normal pregnancy, third trimester: Secondary | ICD-10-CM

## 2022-06-22 DIAGNOSIS — Z3480 Encounter for supervision of other normal pregnancy, unspecified trimester: Secondary | ICD-10-CM

## 2022-06-22 HISTORY — DX: Essential (primary) hypertension: I10

## 2022-06-22 NOTE — Patient Instructions (Addendum)
[facility] ([place] [facility] [place]) Owens Shark, MD; Tamera Punt, MD; Doneen Poisson, MD; Fatima Sanger, MD; Wynetta Emery, MD; Jess Barters, MD; Tami Ribas, MD; Herbert Moors, MD; Derrell Lolling, MD; Dorothyann Peng, MD; Lucious Groves, NP; Baldo Ash, NP [address], [place], [place] [address] [phone] Mechele Dawley, Thur, Fri 8:30-5:30, Wed 9:30-5:30, Sat 8:30-12:30 Babies seen by [facility] providers Accepting Medicaid Only accepting infants of first-time parents or siblings of current patients Hospital discharge coordinator will make follow-up appointment  [place] [phone]) [address], [place] [address] [phone]  Fax [phone]   [facility] [address] [employer],  [place]  [address] [phone] Contraception Choices Contraception, also called birth control, refers to methods or devices that prevent pregnancy. Hormonal methods  Contraceptive implant A contraceptive implant is a thin, plastic tube that contains a hormone that prevents pregnancy. It is different from an intrauterine device (IUD). It is inserted into the upper part of the arm by a health care provider. Implants can be effective for up to 3 years. Progestin-only injections Progestin-only injections are injections of progestin, a synthetic form of the hormone progesterone. They are given every 3 months by a health care provider. Birth control pills Birth control pills are pills that contain hormones that prevent pregnancy. They must be taken once a day, preferably at the same time each day. A prescription is needed to use this method of contraception. Birth control patch The birth control patch contains hormones that prevent pregnancy. It is placed on the skin and must be changed once a week for three weeks and removed on the fourth week. A prescription is needed to use this method of contraception. Vaginal ring A vaginal  ring contains hormones that prevent pregnancy. It is placed in the vagina for three weeks and removed on the fourth week. After that, the process is repeated with a new ring. A prescription is needed to use this method of contraception. Emergency contraceptive Emergency contraceptives prevent pregnancy after unprotected sex. They come in pill form and can be taken up to 5 days after sex. They work best the sooner they are taken after having sex. Most emergency contraceptives are available without a prescription. This method should not be used as your only form of birth control. Barrier methods  Female condom A female condom is a thin sheath that is worn over the penis during sex. Condoms keep sperm from going inside a woman's body. They can be used with a sperm-killing substance (spermicide) to increase their effectiveness. They should be thrown away after one use. Female condom A female condom is a soft, loose-fitting sheath that is put into the vagina before sex. The condom keeps sperm from going inside a woman's body. They should be thrown away after one use. Diaphragm A diaphragm is a soft, dome-shaped barrier. It is inserted into the vagina before sex, along with a spermicide. The diaphragm blocks sperm from entering the uterus, and the spermicide kills sperm. A diaphragm should be left in the vagina for 6-8 hours after sex and removed within 24 hours. A diaphragm is prescribed and fitted by a health care provider. A diaphragm should be replaced every 1-2 years, after giving birth, after gaining more than 15 lb (6.8 kg), and after pelvic surgery. Cervical cap A cervical cap is a round, soft latex or plastic cup that fits over the cervix. It is inserted into the vagina before sex, along with spermicide. It blocks sperm  from entering the uterus. The cap should be left in place for 6-8 hours after sex and removed within 48 hours. A cervical cap must be prescribed and fitted by a health care provider. It  should be replaced every 2 years. Sponge A sponge is a soft, circular piece of polyurethane foam with spermicide in it. The sponge helps block sperm from entering the uterus, and the spermicide kills sperm. To use it, you make it wet and then insert it into the vagina. It should be inserted before sex, left in for at least 6 hours after sex, and removed and thrown away within 30 hours. Spermicides Spermicides are chemicals that kill or block sperm from entering the cervix and uterus. They can come as a cream, jelly, suppository, foam, or tablet. A spermicide should be inserted into the vagina with an applicator at least [id number] minutes before sex to allow time for it to work. The process must be repeated every time you have sex. Spermicides do not require a prescription. Intrauterine contraception Intrauterine device (IUD) An IUD is a T-shaped device that is put in a woman's uterus. There are two types: Hormone IUD.This type contains progestin, a synthetic form of the hormone progesterone. This type can stay in place for 3-5 years. Copper IUD.This type is wrapped in copper wire. It can stay in place for 10 years. Permanent methods of contraception Female tubal ligation In this method, a woman's fallopian tubes are sealed, tied, or blocked during surgery to prevent eggs from traveling to the uterus. Hysteroscopic sterilization In this method, a small, flexible insert is placed into each fallopian tube. The inserts cause scar tissue to form in the fallopian tubes and block them, so sperm cannot reach an egg. The procedure takes about 3 months to be effective. Another form of birth control must be used during those 3 months. Female sterilization This is a procedure to tie off the tubes that carry sperm (vasectomy). After the procedure, the man can still ejaculate fluid (semen). Another form of birth control must be used for 3 months after the procedure. Natural planning methods Natural family planning In  this method, a couple does not have sex on days when the woman could become pregnant. Calendar method In this method, the woman keeps track of the length of each menstrual cycle, identifies the days when pregnancy can happen, and does not have sex on those days. Ovulation method In this method, a couple avoids sex during ovulation. Symptothermal method This method involves not having sex during ovulation. The woman typically checks for ovulation by watching changes in her temperature and in the consistency of cervical mucus. Post-ovulation method In this method, a couple waits to have sex until after ovulation. Where to find more information Centers for Disease Control and Prevention: http://www.wolf.info/ Summary Contraception, also called birth control, refers to methods or devices that prevent pregnancy. Hormonal methods of contraception include implants, injections, pills, patches, vaginal rings, and emergency contraceptives. Barrier methods of contraception can include female condoms, female condoms, diaphragms, cervical caps, sponges, and spermicides. There are two types of IUDs (intrauterine devices). An IUD can be put in a woman's uterus to prevent pregnancy for 3-5 years. Permanent sterilization can be done through a procedure for males and females. Natural family planning methods involve nothaving sex on days when the woman could become pregnant. This information is not intended to replace advice given to you by your health care provider. Make sure you discuss any questions you have with your health care  provider. Document Revised: 10/01/2019 Document Reviewed: 10/01/2019 Elsevier Patient Education  [place].

## 2022-06-22 NOTE — Progress Notes (Signed)
   Subjective:  Alexandra Collins is a 27 y.o. G3P1011 at [redacted]w[redacted]d being seen today for ongoing prenatal care.  She is currently monitored for the following issues for this high-risk pregnancy and has Obesity in pregnancy; History of pre-eclampsia; Suppurative hidradenitis; Foot pain; Acne; Miscarriage; Supervision of other normal pregnancy, antepartum; and Chronic hypertension on their problem list.  Patient reports no complaints.  Contractions: Not present. Vag. Bleeding: None.  Movement: Present. Denies leaking of fluid.   The following portions of the patient's history were reviewed and updated as appropriate: allergies, current medications, past family history, past medical history, past social history, past surgical history and problem list. Problem list updated.  Objective:   Vitals:   06/22/22 1042  BP: 128/85  Pulse: (!) 111  Weight: 288 lb 3.2 oz (130.7 kg)    Fetal Status: Fetal Heart Rate (bpm): 137   Movement: Present     General:  Alert, oriented and cooperative. Patient is in no acute distress.  Skin: Skin is warm and dry. No rash noted.   Cardiovascular: Normal heart rate noted  Respiratory: Normal respiratory effort, no problems with respiration noted  Abdomen: Soft, gravid, appropriate for gestational age. Pain/Pressure: Absent     Pelvic: Vag. Bleeding: None     Cervical exam deferred        Extremities: Normal range of motion.     Mental Status: Normal mood and affect. Normal behavior. Normal judgment and thought content.   Urinalysis:      Assessment and Plan:  Pregnancy: G3P1011 at [redacted]w[redacted]d  1. Supervision of other normal pregnancy, antepartum BP and FHR normal Possible fetal arrythmia auscultated on doppler so placed on NST, reactive but does have some dropped/skipped beats, will message MFM to make them aware Counseled on BTL and consent form signed, she is still deciding Interested in water birth but discussed she is not a candidate Lives closer to [facility]  office, we will facilitate transfer  2. Chronic hypertension On ASA and nifedipine 60 xl (increased yesterday by Dr. Harriet Masson), well controlled Following w MFM, normal growth to date Has weekly testing already scheduled  3. Obesity in pregnancy Following w MFM  4. History of pre-eclampsia On ASA  Preterm labor symptoms and general obstetric precautions including but not limited to vaginal bleeding, contractions, leaking of fluid and fetal movement were reviewed in detail with the patient. Please refer to After Visit Summary for other counseling recommendations.  Return in 2 weeks (on 07/06/2022) for [facility], ob visit.   Clarnce Flock, MD

## 2022-06-24 ENCOUNTER — Encounter: Payer: Self-pay | Admitting: General Practice

## 2022-06-24 ENCOUNTER — Encounter: Payer: Self-pay | Admitting: Obstetrics and Gynecology

## 2022-06-24 ENCOUNTER — Encounter: Payer: Self-pay | Admitting: *Deleted

## 2022-06-24 ENCOUNTER — Encounter: Payer: Self-pay | Admitting: Family Medicine

## 2022-06-24 DIAGNOSIS — O10919 Unspecified pre-existing hypertension complicating pregnancy, unspecified trimester: Secondary | ICD-10-CM | POA: Insufficient documentation

## 2022-06-25 ENCOUNTER — Ambulatory Visit: Payer: BC Managed Care – PPO | Admitting: *Deleted

## 2022-06-25 ENCOUNTER — Ambulatory Visit: Payer: BC Managed Care – PPO | Attending: Obstetrics

## 2022-06-25 VITALS — BP 129/72 | HR 79

## 2022-06-25 DIAGNOSIS — O99213 Obesity complicating pregnancy, third trimester: Secondary | ICD-10-CM | POA: Diagnosis not present

## 2022-06-25 DIAGNOSIS — O10913 Unspecified pre-existing hypertension complicating pregnancy, third trimester: Secondary | ICD-10-CM

## 2022-06-25 DIAGNOSIS — Z3480 Encounter for supervision of other normal pregnancy, unspecified trimester: Secondary | ICD-10-CM

## 2022-06-25 DIAGNOSIS — E669 Obesity, unspecified: Secondary | ICD-10-CM | POA: Insufficient documentation

## 2022-06-25 DIAGNOSIS — O10013 Pre-existing essential hypertension complicating pregnancy, third trimester: Secondary | ICD-10-CM | POA: Diagnosis present

## 2022-06-25 DIAGNOSIS — Z3A33 33 weeks gestation of pregnancy: Secondary | ICD-10-CM | POA: Insufficient documentation

## 2022-06-25 DIAGNOSIS — Z3483 Encounter for supervision of other normal pregnancy, third trimester: Secondary | ICD-10-CM | POA: Insufficient documentation

## 2022-06-25 NOTE — Procedures (Signed)
Alexandra Collins 09/24/1995 [redacted]w[redacted]d Fetus A Non-Stress Test Interpretation for 06/25/22  Indication: Chronic Hypertenstion and Obesity  Fetal Heart Rate A Mode: External Baseline Rate (A): 130 bpm Variability: Moderate Accelerations: 15 x 15 Decelerations: None Multiple birth?: No  Uterine Activity Mode: Palpation, Toco Contraction Frequency (min): no UC noted Resting Tone Palpated: Relaxed Resting Time: Adequate  Interpretation (Fetal Testing) Nonstress Test Interpretation: Reactive Overall Impression: Reassuring for gestational age Comments: tracing reviewed by Dr SAscencion Dike RNC

## 2022-07-02 ENCOUNTER — Ambulatory Visit: Payer: BC Managed Care – PPO | Attending: Obstetrics

## 2022-07-02 ENCOUNTER — Ambulatory Visit: Payer: BC Managed Care – PPO | Admitting: *Deleted

## 2022-07-02 VITALS — BP 134/56 | HR 73

## 2022-07-02 DIAGNOSIS — Z8759 Personal history of other complications of pregnancy, childbirth and the puerperium: Secondary | ICD-10-CM | POA: Diagnosis present

## 2022-07-02 DIAGNOSIS — O10913 Unspecified pre-existing hypertension complicating pregnancy, third trimester: Secondary | ICD-10-CM | POA: Insufficient documentation

## 2022-07-02 DIAGNOSIS — E669 Obesity, unspecified: Secondary | ICD-10-CM

## 2022-07-02 DIAGNOSIS — Z3A34 34 weeks gestation of pregnancy: Secondary | ICD-10-CM

## 2022-07-02 DIAGNOSIS — O10013 Pre-existing essential hypertension complicating pregnancy, third trimester: Secondary | ICD-10-CM

## 2022-07-02 DIAGNOSIS — O99213 Obesity complicating pregnancy, third trimester: Secondary | ICD-10-CM | POA: Insufficient documentation

## 2022-07-02 DIAGNOSIS — O09293 Supervision of pregnancy with other poor reproductive or obstetric history, third trimester: Secondary | ICD-10-CM

## 2022-07-03 ENCOUNTER — Encounter: Payer: Self-pay | Admitting: Cardiology

## 2022-07-05 ENCOUNTER — Encounter: Payer: Self-pay | Admitting: Cardiology

## 2022-07-05 ENCOUNTER — Telehealth: Payer: Self-pay

## 2022-07-05 ENCOUNTER — Ambulatory Visit: Payer: BC Managed Care – PPO | Attending: Cardiology | Admitting: Cardiology

## 2022-07-05 VITALS — BP 148/83 | Ht 66.0 in | Wt 287.0 lb

## 2022-07-05 DIAGNOSIS — O99210 Obesity complicating pregnancy, unspecified trimester: Secondary | ICD-10-CM

## 2022-07-05 DIAGNOSIS — I10 Essential (primary) hypertension: Secondary | ICD-10-CM | POA: Diagnosis not present

## 2022-07-05 MED ORDER — LABETALOL HCL 100 MG PO TABS: 100.0000 mg | ORAL_TABLET | Freq: Two times a day (BID) | ORAL | 3 refills | Status: DC

## 2022-07-05 NOTE — Patient Instructions (Addendum)
Medication Instructions:  Your physician has recommended you make the following change in your medication:  START: Labetalol 100 mg twice daily *If you need a refill on your cardiac medications before your next appointment, please call your pharmacy*   Lab Work: None   Testing/Procedures: None   Follow-Up: At [facility], you and your health needs are our priority.  As part of our continuing mission to provide you with exceptional heart care, we have created designated Provider Care Teams.  These Care Teams include your primary Cardiologist (physician) and Advanced Practice Providers (APPs -  Physician Assistants and Nurse Practitioners) who all work together to provide you with the care you need, when you need it.   Your next appointment:   March 15th at 4pm  Provider:   Berniece Salines, DO

## 2022-07-05 NOTE — Progress Notes (Signed)
[facility]  Follow Up Note  Virtual Visit via Video  Note . I connected with the patient today by a   video enabled telemedicine application and verified that I am speaking with the correct person using two identifiers.  Patient is at home. In the office.  Date:  07/05/2022   ID:  Alexandra Collins, DOB 29-Sep-1995, MRN [id number]  PCP:  Rhetta Mura, MD   [facility] Prov[facility] Cardiologist:  Berniece Salines, DO  Electrophysiologist:  None        Referring MD: Rhetta Mura, MD   Chief Complaint: " I am ok"  History of Present Illness:    Alexandra Collins is a 27 y.o. female [G3P1011] who returns for follow up of hypertension.  Medical history includes obesity, chronic hypertension in pregnancy.   At her last visit I increased nifedipine to 60 mg daily, since I saw her she has seen her OB team she had significant palpitation on this increased dose and this has been increased back to 30 mg.  Prior CV Studies Reviewed: The following studies were reviewed today:   Past Medical History:  Diagnosis Date   Anemia    Hydradenitis    Pregnancy induced hypertension     Past Surgical History:  Procedure Laterality Date   KNEE ARTHROSCOPY Right       OB History     Gravida  3   Para  1   Term  1   Preterm      AB  1   Living  1      SAB  1   IAB      Ectopic      Multiple  0   Live Births  1               Current Medications: Current Meds  Medication Sig   aspirin EC 81 MG tablet Take 1 tablet (81 mg total) by mouth daily. Swallow whole.   labetalol (NORMODYNE) 100 MG tablet Take 1 tablet (100 mg total) by mouth 2 (two) times daily.   NIFEdipine (PROCARDIA XL/NIFEDICAL XL) 60 MG 24 hr tablet Take 1 tablet (60 mg total) by mouth daily. (Patient taking differently: Take 60 mg by mouth daily. Pt taking 30 mg once daily. Baby was having palpations.)   Prenat MV-Min w/Fe-Folate-DHA (PRENATAL COMPLETE PO) Take by mouth.      Allergies:   Ceclor [cefaclor], Elemental sulfur, Garlic, Penicillins, Shellfish allergy, Sulfamethoxazole, Augmentin [amoxicillin-pot clavulanate], and Other   Social History   Socioeconomic History   Marital status: Married    Spouse name: Not on file   Number of children: Not on file   Years of education: Not on file   Highest education level: Bachelor's degree (e.g., BA, AB, BS)  Occupational History   Not on file  Tobacco Use   Smoking status: Never   Smokeless tobacco: Never  Vaping Use   Vaping Use: Never used  Substance and Sexual Activity   Alcohol use: No   Drug use: No   Sexual activity: Yes    Birth control/protection: None  Other Topics Concern   Not on file  Social History Narrative   Not on file   Social Determinants of Health   Financial Resource Strain: Low Risk  (02/04/2022)   Overall Financial Resource Strain (CARDIA)    Difficulty of Paying Living Expenses: Not very hard  Food Insecurity: Food Insecurity Present (02/04/2022)   Hunger Vital Sign    Worried About  Running Out of Food in the Last Year: Sometimes true    Ran Out of Food in the Last Year: Sometimes true  Transportation Needs: No Transportation Needs (02/04/2022)   PRAPARE - Hydrologist (Medical): No    Lack of Transportation (Non-Medical): No  Physical Activity: Unknown (02/04/2022)   Exercise Vital Sign    Days of Exercise per Week: 0 days    Minutes of Exercise per Session: Not on file  Stress: No Stress Concern Present (02/04/2022)   Man    Feeling of Stress : Not at all  Social Connections: Moderately Integrated (02/04/2022)   Social Connection and Isolation Panel [NHANES]    Frequency of Communication with Friends and Family: More than three times a week    Frequency of Social Gatherings with Friends and Family: Twice a week    Attends Religious Services: More than 4 times per year     Active Member of [employer] or Organizations: No    Attends Music therapist: Not on file    Marital Status: Married      Family History  Problem Relation Age of Onset   Hypertension Mother    Asthma Father    Seizures Father    Hypertension Father    Asthma Sister    Asthma Maternal Aunt    Diabetes Neg Hx    Heart disease Neg Hx    Stroke Neg Hx       ROS:   Please see the history of present illness.     All other systems reviewed and are negative.   Labs/EKG Reviewed:    EKG:   EKG is was not ordered today.    Recent Labs: 03/26/2022: ALT 11; BUN 6; Creatinine, Ser 0.65; Potassium 4.0; Sodium 137 05/19/2022: Hemoglobin 11.0; Platelets 388   Recent Lipid Panel No results found for: "CHOL", "TRIG", "HDL", "CHOLHDL", "LDLCALC", "LDLDIRECT"  Physical Exam:    VS:  BP (!) 148/83   Ht '5\' 6"'$  (1.676 m)   Wt 130.2 kg   LMP 11/03/2021   BMI 46.32 kg/m     Wt Readings from Last 3 Encounters:  07/05/22 130.2 kg  06/22/22 130.7 kg  06/21/22 128.8 kg       Risk Assessment/Risk Calculators:                  ASSESSMENT & PLAN:    Chronic hypertension pregnancy Morbid obesity  Her blood pressure is elevated.  I would like to keep her on the nifedipine 30 mg daily and then add labetalol 100 mg twice daily.  I encouraged the patient to continue her aspirin 81 mg daily for preeclampsia prophylaxis.  Total time spent 15 minutes.  See her in 2 weeks.   Patient Instructions  Medication Instructions:  Your physician has recommended you make the following change in your medication:  START: Labetalol 100 mg twice daily *If you need a refill on your cardiac medications before your next appointment, please call your pharmacy*   Lab Work: None   Testing/Procedures: None   Follow-Up: At [facility], you and your health needs are our priority.  As part of our continuing mission to provide you with exceptional heart care, we have  created designated Provider Care Teams.  These Care Teams include your primary Cardiologist (physician) and Advanced Practice Providers (APPs -  Physician Assistants and Nurse Practitioners) who all work together to provide you with  the care you need, when you need it.   Your next appointment:   March 15th at 4pm  Provider:   Berniece Salines, DO    Dispo:  No follow-ups on file.   Medication Adjustments/Labs and Tests Ordered: Current medicines are reviewed at length with the patient today.  Concerns regarding medicines are outlined above.  Tests Ordered: No orders of the defined types were placed in this encounter.  Medication Changes: Meds ordered this encounter  Medications   labetalol (NORMODYNE) 100 MG tablet    Sig: Take 1 tablet (100 mg total) by mouth 2 (two) times daily.    Dispense:  180 tablet    Refill:  3

## 2022-07-05 NOTE — Telephone Encounter (Signed)
Called pt to go over her AVS. She asks if she can be written out on bed rest since her blood pressure has been high. Pt made aware Dr. Harriet Masson might not write her out, encouraged pt to reach out to her OB about the matter. She verbalized understanding.Will get message to Dr. Harriet Masson for review.

## 2022-07-06 ENCOUNTER — Encounter: Payer: Self-pay | Admitting: Family Medicine

## 2022-07-07 ENCOUNTER — Encounter: Payer: Self-pay | Admitting: Obstetrics and Gynecology

## 2022-07-07 ENCOUNTER — Ambulatory Visit (INDEPENDENT_AMBULATORY_CARE_PROVIDER_SITE_OTHER): Payer: BC Managed Care – PPO | Admitting: Obstetrics and Gynecology

## 2022-07-07 ENCOUNTER — Other Ambulatory Visit: Payer: Self-pay

## 2022-07-07 VITALS — BP 125/62 | HR 77 | Wt 291.6 lb

## 2022-07-07 DIAGNOSIS — Z3A35 35 weeks gestation of pregnancy: Secondary | ICD-10-CM

## 2022-07-07 DIAGNOSIS — O10919 Unspecified pre-existing hypertension complicating pregnancy, unspecified trimester: Secondary | ICD-10-CM

## 2022-07-07 DIAGNOSIS — O99213 Obesity complicating pregnancy, third trimester: Secondary | ICD-10-CM

## 2022-07-07 DIAGNOSIS — O99210 Obesity complicating pregnancy, unspecified trimester: Secondary | ICD-10-CM

## 2022-07-07 DIAGNOSIS — O10913 Unspecified pre-existing hypertension complicating pregnancy, third trimester: Secondary | ICD-10-CM

## 2022-07-07 DIAGNOSIS — Z6841 Body Mass Index (BMI) 40.0 and over, adult: Secondary | ICD-10-CM | POA: Insufficient documentation

## 2022-07-07 DIAGNOSIS — Z3480 Encounter for supervision of other normal pregnancy, unspecified trimester: Secondary | ICD-10-CM

## 2022-07-07 LAB — POCT URINALYSIS DIP (DEVICE)
Bilirubin Urine: NEGATIVE
Glucose, UA: NEGATIVE mg/dL
Hgb urine dipstick: NEGATIVE
Ketones, ur: 40 mg/dL — AB
Nitrite: NEGATIVE
Protein, ur: NEGATIVE mg/dL
Specific Gravity, Urine: 1.025 (ref 1.005–1.030)
Urobilinogen, UA: 0.2 mg/dL (ref 0.0–1.0)
pH: 7 (ref 5.0–8.0)

## 2022-07-07 NOTE — Progress Notes (Signed)
Pt reports last night that she was having a lot of pain & pressure in pelvic area  and back w/ increased discharge. She rested and it stopped.

## 2022-07-08 NOTE — Progress Notes (Signed)
   PRENATAL VISIT NOTE  Subjective:  Alexandra Collins is a 27 y.o. G3P1011 at 30w1dbeing seen today for ongoing prenatal care.  She is currently monitored for the following issues for this high-risk pregnancy and has Obesity in pregnancy; History of pre-eclampsia; Suppurative hidradenitis; Acne; Supervision of other normal pregnancy, antepartum; Fetal arrhythmia before the onset of labor; Chronic hypertension in pregnancy; and BMI 40.0-44.9, adult ([facility] on their problem list.  Patient reports  contractions and pain yesterday .  Contractions: Irritability. Vag. Bleeding: None.  Movement: Present. Denies leaking of fluid.   The following portions of the patient's history were reviewed and updated as appropriate: allergies, current medications, past family history, past medical history, past social history, past surgical history and problem list.   Objective:   Vitals:   07/07/22 1601  BP: 125/62  Pulse: 77  Weight: 291 lb 9.6 oz (132.3 kg)    Fetal Status: Fetal Heart Rate (bpm): 145   Movement: Present     General:  Alert, oriented and cooperative. Patient is in no acute distress.  Skin: Skin is warm and dry. No rash noted.   Cardiovascular: Normal heart rate noted  Respiratory: Normal respiratory effort, no problems with respiration noted  Abdomen: Soft, gravid, appropriate for gestational age.  Pain/Pressure: Present     Pelvic: Cervical exam deferred        Extremities: Normal range of motion.  Edema: Trace  Mental Status: Normal mood and affect. Normal behavior. Normal judgment and thought content.   Assessment and Plan:  Pregnancy: G3P1011 at 334w1d. [redacted] weeks gestation of pregnancy BTL papers signed on 2/13 GBS next visit (needs sensitivities) Patient amenable to IOL around 39wks. Set up IOL next visit Labor precautions given and work note given for modifications  2. BMI 40.0-44.9, adult (HCC) stable  3. Chronic hypertension in pregnancy On procadia 60 qday; seen by dr  tobb but hasn't started the labetalol 100 bid. It was a virtual visit that day and BPs were from home cuff. BPs todays great. I told her to hold off on the labetalol and she is coming qwk and we can follow her BPs here and to bring her home cuff next time to correlate with office one Continue qwk testing.  2/23: afi 19, bpp 8/8, ceph 2/9: 20%, 1819g, ac 11%, afi 16, 8/8  4. Obesity in pregnancy  5. Fetal arrhythmia before the onset of labor None today or at most recent scans  6. Supervision of other normal pregnancy, antepartum  Preterm labor symptoms and general obstetric precautions including but not limited to vaginal bleeding, contractions, leaking of fluid and fetal movement were reviewed in detail with the patient. Please refer to After Visit Summary for other counseling recommendations.   Return in 9 days (on 07/16/2022) for in person, md visit, high risk ob.  Future Appointments  Date Time Provider DeDailey3/05/2022  2:45 PM [facility]/05/2022  3:00 PM [facility] US1 [facility]/12/2022 11:15 AM [facility]URSE [facility]2022 11:30 AM [facility] US2 [facility]3/04/2023  4:15 PM CrConcepcion LivingMD [facility]3/15/2024  2:15 PM [facility] NURSE [facility]/15/2024  2:30 PM [facility] US3 [facility]3/15/2024  4:00 PM Tobb, KaGodfrey PickDO CVD-W[facility] None    ChAletha HalimMD

## 2022-07-09 ENCOUNTER — Ambulatory Visit: Payer: BC Managed Care – PPO | Attending: Obstetrics

## 2022-07-09 ENCOUNTER — Ambulatory Visit: Payer: BC Managed Care – PPO | Admitting: *Deleted

## 2022-07-09 VITALS — BP 135/66 | HR 75

## 2022-07-09 DIAGNOSIS — O10013 Pre-existing essential hypertension complicating pregnancy, third trimester: Secondary | ICD-10-CM | POA: Diagnosis not present

## 2022-07-09 DIAGNOSIS — Z3A35 35 weeks gestation of pregnancy: Secondary | ICD-10-CM

## 2022-07-09 DIAGNOSIS — E669 Obesity, unspecified: Secondary | ICD-10-CM

## 2022-07-09 DIAGNOSIS — Z8759 Personal history of other complications of pregnancy, childbirth and the puerperium: Secondary | ICD-10-CM | POA: Insufficient documentation

## 2022-07-09 DIAGNOSIS — O10913 Unspecified pre-existing hypertension complicating pregnancy, third trimester: Secondary | ICD-10-CM | POA: Insufficient documentation

## 2022-07-09 DIAGNOSIS — Z419 Encounter for procedure for purposes other than remedying health state, unspecified: Secondary | ICD-10-CM | POA: Diagnosis not present

## 2022-07-09 DIAGNOSIS — O09293 Supervision of pregnancy with other poor reproductive or obstetric history, third trimester: Secondary | ICD-10-CM

## 2022-07-09 DIAGNOSIS — O99213 Obesity complicating pregnancy, third trimester: Secondary | ICD-10-CM | POA: Diagnosis not present

## 2022-07-14 ENCOUNTER — Telehealth: Payer: Self-pay | Admitting: Family Medicine

## 2022-07-14 ENCOUNTER — Encounter: Payer: Self-pay | Admitting: Family Medicine

## 2022-07-14 NOTE — Telephone Encounter (Signed)
Patient said she stop working on Monday to start her FMLA her EDD is 4-2, needing a note for her job being that she is no longer working

## 2022-07-15 ENCOUNTER — Encounter: Payer: Self-pay | Admitting: Family Medicine

## 2022-07-16 ENCOUNTER — Ambulatory Visit: Payer: BC Managed Care – PPO | Admitting: *Deleted

## 2022-07-16 ENCOUNTER — Other Ambulatory Visit: Payer: Self-pay | Admitting: *Deleted

## 2022-07-16 ENCOUNTER — Ambulatory Visit: Payer: BC Managed Care – PPO | Attending: Obstetrics

## 2022-07-16 VITALS — BP 131/63 | HR 66

## 2022-07-16 DIAGNOSIS — O09293 Supervision of pregnancy with other poor reproductive or obstetric history, third trimester: Secondary | ICD-10-CM

## 2022-07-16 DIAGNOSIS — O10913 Unspecified pre-existing hypertension complicating pregnancy, third trimester: Secondary | ICD-10-CM | POA: Diagnosis present

## 2022-07-16 DIAGNOSIS — Z3689 Encounter for other specified antenatal screening: Secondary | ICD-10-CM

## 2022-07-16 DIAGNOSIS — O99213 Obesity complicating pregnancy, third trimester: Secondary | ICD-10-CM | POA: Insufficient documentation

## 2022-07-16 DIAGNOSIS — O10013 Pre-existing essential hypertension complicating pregnancy, third trimester: Secondary | ICD-10-CM | POA: Diagnosis not present

## 2022-07-16 DIAGNOSIS — Z3A36 36 weeks gestation of pregnancy: Secondary | ICD-10-CM

## 2022-07-16 DIAGNOSIS — E669 Obesity, unspecified: Secondary | ICD-10-CM

## 2022-07-16 DIAGNOSIS — O09299 Supervision of pregnancy with other poor reproductive or obstetric history, unspecified trimester: Secondary | ICD-10-CM | POA: Diagnosis present

## 2022-07-16 NOTE — Telephone Encounter (Signed)
Addressed via [place]. Request has been forwarded to A. Clinton who is completing all FMLA requests.

## 2022-07-20 ENCOUNTER — Encounter: Payer: BC Managed Care – PPO | Admitting: Family Medicine

## 2022-07-20 ENCOUNTER — Encounter: Payer: Self-pay | Admitting: Family Medicine

## 2022-07-20 ENCOUNTER — Other Ambulatory Visit: Payer: Self-pay

## 2022-07-20 ENCOUNTER — Other Ambulatory Visit (HOSPITAL_COMMUNITY)
Admission: RE | Admit: 2022-07-20 | Discharge: 2022-07-20 | Disposition: A | Discharge status: Home / Self Care | Payer: BC Managed Care – PPO | Source: Ambulatory Visit | Attending: Family Medicine | Admitting: Family Medicine

## 2022-07-20 ENCOUNTER — Ambulatory Visit (INDEPENDENT_AMBULATORY_CARE_PROVIDER_SITE_OTHER): Payer: BC Managed Care – PPO | Admitting: Family Medicine

## 2022-07-20 VITALS — BP 128/80 | HR 109 | Wt 291.0 lb

## 2022-07-20 DIAGNOSIS — Z6841 Body Mass Index (BMI) 40.0 and over, adult: Secondary | ICD-10-CM | POA: Insufficient documentation

## 2022-07-20 DIAGNOSIS — Z3480 Encounter for supervision of other normal pregnancy, unspecified trimester: Secondary | ICD-10-CM | POA: Diagnosis present

## 2022-07-20 DIAGNOSIS — O10919 Unspecified pre-existing hypertension complicating pregnancy, unspecified trimester: Secondary | ICD-10-CM | POA: Insufficient documentation

## 2022-07-20 DIAGNOSIS — Z8759 Personal history of other complications of pregnancy, childbirth and the puerperium: Secondary | ICD-10-CM | POA: Insufficient documentation

## 2022-07-20 DIAGNOSIS — Z3A37 37 weeks gestation of pregnancy: Secondary | ICD-10-CM

## 2022-07-20 DIAGNOSIS — O10913 Unspecified pre-existing hypertension complicating pregnancy, third trimester: Secondary | ICD-10-CM

## 2022-07-20 NOTE — Progress Notes (Unsigned)
   PRENATAL VISIT NOTE  Subjective:  Alexandra Collins is a 27 y.o. G3P1011 at [redacted]w[redacted]d being seen today for ongoing prenatal care.  She is currently monitored for the following issues for this high-risk pregnancy and has Obesity in pregnancy; History of pre-eclampsia; Suppurative hidradenitis; Acne; Supervision of other normal pregnancy, antepartum; Fetal arrhythmia before the onset of labor; Chronic hypertension in pregnancy; and BMI 40.0-44.9, adult ([place]) on their problem list.  Patient reports no bleeding, no cramping, no leaking, and occasional contractions.  Contractions: Irregular. Vag. Bleeding: None.  Movement: Present. Denies leaking of fluid.   The following portions of the patient's history were reviewed and updated as appropriate: allergies, current medications, past family history, past medical history, past social history, past surgical history and problem list.   Objective:   Vitals:   07/20/22 1620  BP: 128/80  Pulse: (!) 109  Weight: 291 lb (132 kg)    Fetal Status: Fetal Heart Rate (bpm): 132   Movement: Present     General:  Alert, oriented and cooperative. Patient is in no acute distress.  Skin: Skin is warm and dry. No rash noted.   Cardiovascular: Normal heart rate noted  Respiratory: Normal respiratory effort, no problems with respiration noted  Abdomen: Soft, gravid, appropriate for gestational age.  Pain/Pressure: Present     Pelvic: Cervical exam deferred        Extremities: Normal range of motion.     Mental Status: Normal mood and affect. Normal behavior. Normal judgment and thought content.   Assessment and Plan:  Pregnancy: G3P1011 at [redacted]w[redacted]d 1. BMI 40.0-44.9, adult ([facility]) - Culture, beta strep (group b only) - GC/Chlamydia probe amp ([place])not at [facility]  2. Chronic hypertension in pregnancy Blood pressure well-controlled today Induction 38-39.6 scheduled - Culture, beta strep (group b only) - GC/Chlamydia probe amp ([place])not at [facility]  3.  Supervision of other normal pregnancy, antepartum Continue routine prenatal care - Culture, beta strep (group b only) - GC/Chlamydia probe amp ([place])not at [facility]  4. History of pre-eclampsia Blood pressure well-controlled today.  No signs of preeclampsia. - Culture, beta strep (group b only) - GC/Chlamydia probe amp ([place])not at [facility]  Term labor symptoms and general obstetric precautions including but not limited to vaginal bleeding, contractions, leaking of fluid and fetal movement were reviewed in detail with the patient. Please refer to After Visit Summary for other counseling recommendations.   No follow-ups on file.  Future Appointments  Date Time Provider [place]  07/23/2022  2:15 PM [facility] NURSE [facility]  07/23/2022  2:30 PM [facility] US3 [facility]  07/23/2022  4:00 PM Tobb, Kardie, DO [facility] None  07/28/2022  8:15 AM Woodroe Mode, MD [facility]  07/30/2022  2:30 PM [facility] NURSE [facility]  07/30/2022  2:45 PM [facility] US6 W[facility]-M[facility]US [place]    Concepcion Living, MD

## 2022-07-21 LAB — GC/CHLAMYDIA PROBE AMP (~~LOC~~) NOT AT ARMC
Chlamydia: NEGATIVE
Comment: NEGATIVE
Comment: NORMAL
Neisseria Gonorrhea: NEGATIVE

## 2022-07-23 ENCOUNTER — Ambulatory Visit: Payer: BC Managed Care – PPO | Admitting: *Deleted

## 2022-07-23 ENCOUNTER — Ambulatory Visit (INDEPENDENT_AMBULATORY_CARE_PROVIDER_SITE_OTHER): Payer: BC Managed Care – PPO | Admitting: Cardiology

## 2022-07-23 ENCOUNTER — Ambulatory Visit: Payer: BC Managed Care – PPO | Attending: Obstetrics

## 2022-07-23 ENCOUNTER — Encounter: Payer: Self-pay | Admitting: Cardiology

## 2022-07-23 VITALS — BP 118/66 | HR 86 | Ht 66.0 in | Wt 291.0 lb

## 2022-07-23 VITALS — BP 118/66 | HR 76

## 2022-07-23 DIAGNOSIS — Z8759 Personal history of other complications of pregnancy, childbirth and the puerperium: Secondary | ICD-10-CM | POA: Diagnosis not present

## 2022-07-23 DIAGNOSIS — O09299 Supervision of pregnancy with other poor reproductive or obstetric history, unspecified trimester: Secondary | ICD-10-CM

## 2022-07-23 DIAGNOSIS — Z3480 Encounter for supervision of other normal pregnancy, unspecified trimester: Secondary | ICD-10-CM

## 2022-07-23 DIAGNOSIS — Z3A37 37 weeks gestation of pregnancy: Secondary | ICD-10-CM | POA: Insufficient documentation

## 2022-07-23 DIAGNOSIS — O99210 Obesity complicating pregnancy, unspecified trimester: Secondary | ICD-10-CM | POA: Diagnosis not present

## 2022-07-23 DIAGNOSIS — O99213 Obesity complicating pregnancy, third trimester: Secondary | ICD-10-CM | POA: Diagnosis present

## 2022-07-23 DIAGNOSIS — O0993 Supervision of high risk pregnancy, unspecified, third trimester: Secondary | ICD-10-CM

## 2022-07-23 DIAGNOSIS — O10013 Pre-existing essential hypertension complicating pregnancy, third trimester: Secondary | ICD-10-CM

## 2022-07-23 DIAGNOSIS — E669 Obesity, unspecified: Secondary | ICD-10-CM | POA: Diagnosis not present

## 2022-07-23 DIAGNOSIS — O10913 Unspecified pre-existing hypertension complicating pregnancy, third trimester: Secondary | ICD-10-CM | POA: Diagnosis present

## 2022-07-23 DIAGNOSIS — O10919 Unspecified pre-existing hypertension complicating pregnancy, unspecified trimester: Secondary | ICD-10-CM | POA: Diagnosis not present

## 2022-07-23 DIAGNOSIS — O09293 Supervision of pregnancy with other poor reproductive or obstetric history, third trimester: Secondary | ICD-10-CM | POA: Insufficient documentation

## 2022-07-23 NOTE — Patient Instructions (Signed)
Medication Instructions:  Your physician recommends that you continue on your current medications as directed. Please refer to the Current Medication list given to you today.  *If you need a refill on your cardiac medications before your next appointment, please call your pharmacy*   Lab Work: None   Testing/Procedures: None   Follow-Up: At [facility], you and your health needs are our priority.  As part of our continuing mission to provide you with exceptional heart care, we have created designated Provider Care Teams.  These Care Teams include your primary Cardiologist (physician) and Advanced Practice Providers (APPs -  Physician Assistants and Nurse Practitioners) who all work together to provide you with the care you need, when you need it.  Your next appointment:   8 week(s)  Provider:   Berniece Salines, DO

## 2022-07-23 NOTE — Progress Notes (Signed)
[facility]  Follow Up Note   Date:  07/26/2022   ID:  Alexandra Collins, DOB 1996/02/07, MRN [id number]  PCP:  Rhetta Mura, MD   [place] Providers Cardiologist:  Berniece Salines, DO  Electrophysiologist:  None        Referring MD: Rhetta Mura, MD   Chief Complaint: " I am ok"  History of Present Illness:    Alexandra Collins is a 27 y.o. female [G3P1011] who returns for follow up of hypertension.  Medical history includes obesity, chronic hypertension in pregnancy.   At her last visit, I stopped the nifedipine and started the labetalol 100 mg twice a day.  I also encourage the patient take her aspirin.   She is here today with her mother for a follow up visit - she is [redacted] weeks pregnant.   Prior CV Studies Reviewed: The following studies were reviewed today:   Past Medical History:  Diagnosis Date   Anemia    Chronic hypertension 06/22/2022   Hydradenitis    Pregnancy induced hypertension     Past Surgical History:  Procedure Laterality Date   KNEE ARTHROSCOPY Right       OB History     Gravida  3   Para  1   Term  1   Preterm      AB  1   Living  1      SAB  1   IAB      Ectopic      Multiple  0   Live Births  1               Current Medications: Current Meds  Medication Sig   aspirin EC 81 MG tablet Take 1 tablet (81 mg total) by mouth daily. Swallow whole.   labetalol (NORMODYNE) 100 MG tablet Take 100 mg by mouth 2 (two) times daily.   Prenat MV-Min w/Fe-Folate-DHA (PRENATAL COMPLETE PO) Take by mouth.     Allergies:   Ceclor [cefaclor], Elemental sulfur, Garlic, Penicillins, Shellfish allergy, Sulfamethoxazole, Augmentin [amoxicillin-pot clavulanate], and Other   Social History   Socioeconomic History   Marital status: Married    Spouse name: Not on file   Number of children: Not on file   Years of education: Not on file   Highest education level: Bachelor's degree (e.g., BA, AB, BS)   Occupational History   Not on file  Tobacco Use   Smoking status: Never   Smokeless tobacco: Never  Vaping Use   Vaping Use: Never used  Substance and Sexual Activity   Alcohol use: No   Drug use: No   Sexual activity: Yes    Birth control/protection: None  Other Topics Concern   Not on file  Social History Narrative   Not on file   Social Determinants of Health   Financial Resource Strain: Low Risk  (02/04/2022)   Overall Financial Resource Strain (CARDIA)    Difficulty of Paying Living Expenses: Not very hard  Food Insecurity: Food Insecurity Present (02/04/2022)   Hunger Vital Sign    Worried About Running Out of Food in the Last Year: Sometimes true    Ran Out of Food in the Last Year: Sometimes true  Transportation Needs: No Transportation Needs (02/04/2022)   PRAPARE - Hydrologist (Medical): No    Lack of Transportation (Non-Medical): No  Physical Activity: Unknown (02/04/2022)   Exercise Vital Sign    Days of Exercise per Week: 0  days    Minutes of Exercise per Session: Not on file  Stress: No Stress Concern Present (02/04/2022)   [place]    Feeling of Stress : Not at all  Social Connections: Moderately Integrated (02/04/2022)   Social Connection and Isolation Panel [NHANES]    Frequency of Communication with Friends and Family: More than three times a week    Frequency of Social Gatherings with Friends and Family: Twice a week    Attends Religious Services: More than 4 times per year    Active Member of [employer] or Organizations: No    Attends Music therapist: Not on file    Marital Status: Married      Family History  Problem Relation Age of Onset   Hypertension Mother    Asthma Father    Seizures Father    Hypertension Father    Asthma Sister    Asthma Maternal Aunt    Diabetes Neg Hx    Heart disease Neg Hx    Stroke Neg Hx       ROS:    Please see the history of present illness.     All other systems reviewed and are negative.   Labs/EKG Reviewed:    EKG:   EKG is was not ordered today.    Recent Labs: 03/26/2022: ALT 11; BUN 6; Creatinine, Ser 0.65; Potassium 4.0; Sodium 137 05/19/2022: Hemoglobin 11.0; Platelets 388   Recent Lipid Panel No results found for: "CHOL", "TRIG", "HDL", "CHOLHDL", "LDLCALC", "LDLDIRECT"  Physical Exam:    VS:  BP 118/66   Pulse 86   Ht 5\' 6"  (1.676 m)   Wt 132 kg   LMP 11/03/2021   SpO2 98%   BMI 46.97 kg/m     Wt Readings from Last 3 Encounters:  07/23/22 132 kg  07/20/22 132 kg  07/07/22 132.3 kg       Risk Assessment/Risk Calculators:                 ASSESSMENT & PLAN:    Chronic hypertension pregnancy Morbid obesity  Her blood pressure is at target.  I would like to keep her on the nifedipine 30 mg daily and then add labetalol 100 mg twice daily.  I encouraged the patient to continue her aspirin 81 mg daily for preeclampsia prophylaxis.  Total time spent 15 minutes.  See her in 2 weeks.   Patient Instructions  Medication Instructions:  Your physician recommends that you continue on your current medications as directed. Please refer to the Current Medication list given to you today.  *If you need a refill on your cardiac medications before your next appointment, please call your pharmacy*   Lab Work: None   Testing/Procedures: None   Follow-Up: At [facility], you and your health needs are our priority.  As part of our continuing mission to provide you with exceptional heart care, we have created designated Provider Care Teams.  These Care Teams include your primary Cardiologist (physician) and Advanced Practice Providers (APPs -  Physician Assistants and Nurse Practitioners) who all work together to provide you with the care you need, when you need it.  Your next appointment:   8 week(s)  Provider:   Berniece Salines, DO       Dispo:  No follow-ups on file.   Medication Adjustments/Labs and Tests Ordered: Current medicines are reviewed at length with the patient today.  Concerns regarding medicines are outlined above.  Tests Ordered: No orders of the defined types were placed in this encounter.  Medication Changes: No orders of the defined types were placed in this encounter.

## 2022-07-23 NOTE — Progress Notes (Incomplete)
[name]  Follow Up Note   Date:  07/23/2022   ID:  Alexandra Collins, DOB Jan 02, 1996, MRN [id number]  PCP:  Rhetta Mura, MD   [place] Providers Cardiologist:  Berniece Salines, DO  Electrophysiologist:  None        Referring MD: Rhetta Mura, MD   Chief Complaint: " I am ok"  History of Present Illness:    Alexandra Collins is a 27 y.o. female [G3P1011] who returns for follow up of hypertension.  Medical history includes obesity, chronic hypertension in pregnancy.   At her last visit, I stopped the nifedipine and started the labetaolol 100 mg twice a day.  I also encourage the patient   Prior CV Studies Reviewed: The following studies were reviewed today:   Past Medical History:  Diagnosis Date  . Anemia   . Chronic hypertension 06/22/2022  . Hydradenitis   . Pregnancy induced hypertension     Past Surgical History:  Procedure Laterality Date  . KNEE ARTHROSCOPY Right       OB History     Gravida  3   Para  1   Term  1   Preterm      AB  1   Living  1      SAB  1   IAB      Ectopic      Multiple  0   Live Births  1               Current Medications: Current Meds  Medication Sig  . aspirin EC 81 MG tablet Take 1 tablet (81 mg total) by mouth daily. Swallow whole.  Marland Kitchen NIFEdipine (PROCARDIA XL/NIFEDICAL XL) 60 MG 24 hr tablet Take 1 tablet (60 mg total) by mouth daily. (Patient taking differently: Take 60 mg by mouth daily. Pt taking 30 mg once daily. Baby was having palpations.)  . Prenat MV-Min w/Fe-Folate-DHA (PRENATAL COMPLETE PO) Take by mouth.     Allergies:   Ceclor [cefaclor], Elemental sulfur, Garlic, Penicillins, Shellfish allergy, Sulfamethoxazole, Augmentin [amoxicillin-pot clavulanate], and Other   Social History   Socioeconomic History  . Marital status: Married    Spouse name: Not on file  . Number of children: Not on file  . Years of education: Not on file  . Highest education level:  Bachelor's degree (e.g., BA, AB, BS)  Occupational History  . Not on file  Tobacco Use  . Smoking status: Never  . Smokeless tobacco: Never  Vaping Use  . Vaping Use: Never used  Substance and Sexual Activity  . Alcohol use: No  . Drug use: No  . Sexual activity: Yes    Birth control/protection: None  Other Topics Concern  . Not on file  Social History Narrative  . Not on file   Social Determinants of Health   Financial Resource Strain: Low Risk  (02/04/2022)   Overall Financial Resource Strain (CARDIA)   . Difficulty of Paying Living Expenses: Not very hard  Food Insecurity: Food Insecurity Present (02/04/2022)   Hunger Vital Sign   . Worried About Charity fundraiser in the Last Year: Sometimes true   . Ran Out of Food in the Last Year: Sometimes true  Transportation Needs: No Transportation Needs (02/04/2022)   PRAPARE - Transportation   . Lack of Transportation (Medical): No   . Lack of Transportation (Non-Medical): No  Physical Activity: Unknown (02/04/2022)   Exercise Vital Sign   . Days of Exercise per Week: 0  days   . Minutes of Exercise per Session: Not on file  Stress: No Stress Concern Present (02/04/2022)   [place]   . Feeling of Stress : Not at all  Social Connections: Moderately Integrated (02/04/2022)   Social Connection and Isolation Panel [NHANES]   . Frequency of Communication with Friends and Family: More than three times a week   . Frequency of Social Gatherings with Friends and Family: Twice a week   . Attends Religious Services: More than 4 times per year   . Active Member of Clubs or Organizations: No   . Attends Archivist Meetings: Not on file   . Marital Status: Married      Family History  Problem Relation Age of Onset  . Hypertension Mother   . Asthma Father   . Seizures Father   . Hypertension Father   . Asthma Sister   . Asthma Maternal Aunt   . Diabetes Neg  Hx   . Heart disease Neg Hx   . Stroke Neg Hx       ROS:   Please see the history of present illness.     All other systems reviewed and are negative.   Labs/EKG Reviewed:    EKG:   EKG is was not ordered today.    Recent Labs: 03/26/2022: ALT 11; BUN 6; Creatinine, Ser 0.65; Potassium 4.0; Sodium 137 05/19/2022: Hemoglobin 11.0; Platelets 388   Recent Lipid Panel No results found for: "CHOL", "TRIG", "HDL", "CHOLHDL", "LDLCALC", "LDLDIRECT"  Physical Exam:    VS:  BP 118/66   Pulse 86   Ht 5\' 6"  (1.676 m)   Wt 132 kg   LMP 11/03/2021   SpO2 98%   BMI 46.97 kg/m     Wt Readings from Last 3 Encounters:  07/23/22 132 kg  07/20/22 132 kg  07/07/22 132.3 kg       Risk Assessment/Risk Calculators:            { Click for [id number] Score - THEN Refresh Note    :[id number]      ASSESSMENT & PLAN:    Chronic hypertension pregnancy Morbid obesity  Her blood pressure is elevated.  I would like to keep her on the nifedipine 30 mg daily and then add labetalol 100 mg twice daily.  I encouraged the patient to continue her aspirin 81 mg daily for preeclampsia prophylaxis.  Total time spent 15 minutes.  See her in 2 weeks.   Patient Instructions  Medication Instructions:  Your physician recommends that you continue on your current medications as directed. Please refer to the Current Medication list given to you today.  *If you need a refill on your cardiac medications before your next appointment, please call your pharmacy*   Lab Work: None   Testing/Procedures: None   Follow-Up: At [facility], you and your health needs are our priority.  As part of our continuing mission to provide you with exceptional heart care, we have created designated Provider Care Teams.  These Care Teams include your primary Cardiologist (physician) and Advanced Practice Providers (APPs -  Physician Assistants and Nurse Practitioners) who all work together to provide  you with the care you need, when you need it.  Your next appointment:   8 week(s)  Provider:   Berniece Salines, DO      Dispo:  No follow-ups on file.   Medication Adjustments/Labs and Tests Ordered: Current medicines are reviewed at  length with the patient today.  Concerns regarding medicines are outlined above.  Tests Ordered: No orders of the defined types were placed in this encounter.  Medication Changes: No orders of the defined types were placed in this encounter.

## 2022-07-24 LAB — STREP GP B CULTURE+RFLX: Strep Gp B Culture+Rflx: NEGATIVE

## 2022-07-28 ENCOUNTER — Encounter (HOSPITAL_COMMUNITY): Payer: Self-pay | Admitting: *Deleted

## 2022-07-28 ENCOUNTER — Telehealth (HOSPITAL_COMMUNITY): Payer: Self-pay | Admitting: *Deleted

## 2022-07-28 ENCOUNTER — Ambulatory Visit (INDEPENDENT_AMBULATORY_CARE_PROVIDER_SITE_OTHER): Payer: BC Managed Care – PPO | Admitting: Obstetrics & Gynecology

## 2022-07-28 ENCOUNTER — Ambulatory Visit
Admission: EM | Admit: 2022-07-28 | Discharge: 2022-07-28 | Disposition: A | Discharge status: Home / Self Care | Payer: BC Managed Care – PPO | Attending: Nurse Practitioner | Admitting: Nurse Practitioner

## 2022-07-28 ENCOUNTER — Other Ambulatory Visit: Payer: Self-pay

## 2022-07-28 VITALS — BP 130/81 | HR 87 | Wt 293.4 lb

## 2022-07-28 DIAGNOSIS — Z3480 Encounter for supervision of other normal pregnancy, unspecified trimester: Secondary | ICD-10-CM

## 2022-07-28 DIAGNOSIS — O10913 Unspecified pre-existing hypertension complicating pregnancy, third trimester: Secondary | ICD-10-CM

## 2022-07-28 DIAGNOSIS — Z3A38 38 weeks gestation of pregnancy: Secondary | ICD-10-CM | POA: Diagnosis not present

## 2022-07-28 DIAGNOSIS — S93412A Sprain of calcaneofibular ligament of left ankle, initial encounter: Secondary | ICD-10-CM | POA: Diagnosis not present

## 2022-07-28 DIAGNOSIS — Z8759 Personal history of other complications of pregnancy, childbirth and the puerperium: Secondary | ICD-10-CM

## 2022-07-28 DIAGNOSIS — O10919 Unspecified pre-existing hypertension complicating pregnancy, unspecified trimester: Secondary | ICD-10-CM

## 2022-07-28 NOTE — ED Triage Notes (Signed)
Pt reports left ankle pain since last night; swelling in left ankle and not ankle to put pressure in the left ankle and big toe sine this morning.   Pt is [redacted] weeks pregnant.

## 2022-07-28 NOTE — Discharge Instructions (Addendum)
Your ankle is most likely sprained; please wear the ACE wrap whenever walking on your ankle Please rest your ankle and keep it elevated when sitting down Use ice 15 minutes on, 45 minutes off every hour while awake You can take Tylenol 500 mg every 6 hours as needed for pain When the pain goes down some, recommend starting ankle exercises Follow up with Orthopedic provider if ankle pain persists or worsens despite treatment

## 2022-07-28 NOTE — Progress Notes (Signed)
   PRENATAL VISIT NOTE  Subjective:  Alexandra Collins is a 27 y.o. G3P1011 at [redacted]w[redacted]d being seen today for ongoing prenatal care.  She is currently monitored for the following issues for this high-risk pregnancy and has Obesity in pregnancy; History of pre-eclampsia; Suppurative hidradenitis; Acne; Supervision of other normal pregnancy, antepartum; Fetal arrhythmia before the onset of labor; Chronic hypertension in pregnancy; and BMI 40.0-44.9, adult ([place]) on their problem list.  Patient reports no complaints.  Contractions: Irritability. Vag. Bleeding: None.  Movement: Present. Denies leaking of fluid.   The following portions of the patient's history were reviewed and updated as appropriate: allergies, current medications, past family history, past medical history, past social history, past surgical history and problem list.   Objective:   Vitals:   07/28/22 0822  BP: 130/81  Pulse: 87  Weight: 293 lb 6.4 oz (133.1 kg)    Fetal Status: Fetal Heart Rate (bpm): 132   Movement: Present     General:  Alert, oriented and cooperative. Patient is in no acute distress.  Skin: Skin is warm and dry. No rash noted.   Cardiovascular: Normal heart rate noted  Respiratory: Normal respiratory effort, no problems with respiration noted  Abdomen: Soft, gravid, appropriate for gestational age.  Pain/Pressure: Present     Pelvic: Cervical exam deferred        Extremities: Normal range of motion.  Edema: None  Mental Status: Normal mood and affect. Normal behavior. Normal judgment and thought content.   Assessment and Plan:  Pregnancy: G3P1011 at [redacted]w[redacted]d There are no diagnoses linked to this encounter. Term labor symptoms and general obstetric precautions including but not limited to vaginal bleeding, contractions, leaking of fluid and fetal movement were reviewed in detail with the patient. Please refer to After Visit Summary for other counseling recommendations.   Supervision of other normal pregnancy,  antepartum  Chronic hypertension in pregnancy - Patient is taking Procardia 60 mg daily  History of pre-eclampsia  - Patient taking 81 mg Aspirin   Return in 5 days (on 08/02/2022).  Future Appointments  Date Time Provider [place]  07/30/2022  2:30 PM [facility] NURSE [facility]  07/30/2022  2:45 PM [facility] US6 [facility]  08/02/2022  8:15 AM Simona Huh [facility]  08/05/2022 12:00 AM MC-LD [place] [facility]-INDC None  09/17/2022  2:00 PM Tobb, Godfrey Pick, DO [facility] None    Gabriella DeCoste, Student-PA   Attestation of Attending Supervision of PA Student: Evaluation and management procedures were performed by the PA student under my supervision and collaboration.  I have reviewed the student's note and chart, and I agree with the management and plan.  Emeterio Reeve, MD, [place] Attending [place], [facility]

## 2022-07-28 NOTE — ED Provider Notes (Signed)
[facility]    CSN: [id number] Arrival date & time: 07/28/22  [id number]      History   Chief Complaint Chief Complaint  Patient presents with   Ankle Pain    HPI Alexandra Collins is a 27 y.o. female.   Patient presents today for 1 day history of left ankle pain.  Reports the area is tender, slightly swollen, and hurts when she walks on it.  No recent accident, fall, trauma, or injury to the ankle.  No recent sudden increase in physical activity.  Reports she is [redacted] weeks pregnant and is currently on bedrest for history of preeclampsia and gestational hypertension.  Reports yesterday, she was going up and down the stairs quite a bit.  No numbness or tingling in her toes.  No bruising, redness, obvious deformity to left ankle.  Reports she can move it, however it hurts.  Has not taken or tried anything for pain so far.    Past Medical History:  Diagnosis Date   Anemia    Chronic hypertension 06/22/2022   Hydradenitis    Pregnancy induced hypertension    Spinal headache     Patient Active Problem List   Diagnosis Date Noted   BMI 40.0-44.9, adult ([place]) 07/07/2022   Chronic hypertension in pregnancy 06/24/2022   Fetal arrhythmia before the onset of labor 06/22/2022   Supervision of other normal pregnancy, antepartum 01/21/2022   History of pre-eclampsia 08/24/2019   Obesity in pregnancy 12/21/2018   Acne 02/26/2013   Suppurative hidradenitis 07/26/2012    Past Surgical History:  Procedure Laterality Date   KNEE ARTHROSCOPY Right     OB History     Gravida  3   Para  1   Term  1   Preterm      AB  1   Living  1      SAB  1   IAB      Ectopic      Multiple  0   Live Births  1            Home Medications    Prior to Admission medications   Medication Sig Start Date End Date Taking? Authorizing Provider  aspirin EC 81 MG tablet Take 1 tablet (81 mg total) by mouth daily. Swallow whole. 02/04/22   Ndulue, Chiagoziem J, MD  labetalol  (NORMODYNE) 100 MG tablet Take 100 mg by mouth 2 (two) times daily. Patient not taking: Reported on 07/28/2022    [provider]  NIFEdipine (PROCARDIA XL/NIFEDICAL XL) 60 MG 24 hr tablet Take 1 tablet (60 mg total) by mouth daily. 06/21/22   Tobb, Kardie, DO  Prenat MV-Min w/Fe-Folate-DHA (PRENATAL COMPLETE PO) Take by mouth.    [provider]  cetirizine (ZYRTEC ALLERGY) 10 MG tablet Take 1 tablet (10 mg total) by mouth daily. Patient not taking: Reported on 03/12/2020 01/04/20 04/29/20  Hall-Potvin, [place], PA-C  fluticasone (FLONASE) 50 MCG/ACT nasal spray Place 1 spray into both nostrils daily. Patient not taking: Reported on 03/12/2020 01/04/20 04/29/20  Hall-Potvin, [place], PA-C    Family History Family History  Problem Relation Age of Onset   Hypertension Mother    Asthma Father    Seizures Father    Hypertension Father    Asthma Sister    Asthma Maternal Aunt    Diabetes Neg Hx    Heart disease Neg Hx    Stroke Neg Hx     Social History Social History   Tobacco Use  Smoking status: Never   Smokeless tobacco: Never  Vaping Use   Vaping Use: Never used  Substance Use Topics   Alcohol use: No   Drug use: No     Allergies   Ceclor [cefaclor], Elemental sulfur, Garlic, Penicillins, Shellfish allergy, Sulfamethoxazole, Shrimp extract, Augmentin [amoxicillin-pot clavulanate], and Other   Review of Systems Review of Systems Per HPI  Physical Exam Triage Vital Signs ED Triage Vitals  Enc Vitals Group     BP 07/28/22 1055 131/82     Pulse Rate 07/28/22 1055 94     Resp 07/28/22 1055 18     Temp 07/28/22 1055 98.2 F (36.8 C)     Temp Source 07/28/22 1055 Oral     SpO2 07/28/22 1055 97 %     Weight --      Height --      Head Circumference --      Peak Flow --      Pain Score 07/28/22 1059 8     Pain Loc --      Pain Edu? --      Excl. in [place]? --    No data found.  Updated Vital Signs BP 131/82 (BP Location: Right Wrist)   Pulse 94    Temp 98.2 F (36.8 C) (Oral)   Resp 18   LMP 11/03/2021   SpO2 97%   Visual Acuity Right Eye Distance:   Left Eye Distance:   Bilateral Distance:    Right Eye Near:   Left Eye Near:    Bilateral Near:     Physical Exam Vitals and nursing note reviewed.  Constitutional:      General: She is not in acute distress.    Appearance: Normal appearance. She is not toxic-appearing.  HENT:     Mouth/Throat:     Mouth: Mucous membranes are moist.     Pharynx: Oropharynx is clear.  Pulmonary:     Effort: Pulmonary effort is normal. No respiratory distress.  Musculoskeletal:     Right lower leg: No tenderness or bony tenderness. No edema.     Left lower leg: No tenderness or bony tenderness. No edema.     Right ankle: Normal.     Left ankle: No swelling or deformity. Tenderness present over the CF ligament. Normal range of motion.     Left Achilles Tendon: Normal. No tenderness or defects. Thompson's test negative.       Feet:     Comments: Inspection: no swelling, obvious deformity or redness to left ankle Palpation: Left medial malleolus exquisitely tender to palpation in areas marked; no obvious deformities palpated ROM: Full ROM to ankle and flexibility of foot  Strength: 5/5 bilateral ankles Neurovascular: neurovascularly intact in left and right lower extremity   Skin:    General: Skin is warm and dry.     Capillary Refill: Capillary refill takes less than 2 seconds.     Coloration: Skin is not jaundiced or pale.     Findings: No erythema.  Neurological:     Mental Status: She is alert and oriented to person, place, and time.  Psychiatric:        Behavior: Behavior is cooperative.      UC Treatments / Results  Labs (all labs ordered are listed, but only abnormal results are displayed) Labs Reviewed - No data to display  EKG   Radiology No results found.  Procedures Procedures (including critical care time)  Medications Ordered in UC Medications - No data  to display  Initial Impression / Assessment and Plan / UC Course  I have reviewed the triage vital signs and the nursing notes.  Pertinent labs & imaging results that were available during my care of the patient were reviewed by me and considered in my medical decision making (see chart for details).   Patient is well-appearing, normotensive, afebrile, not tachycardic, not tachypneic, oxygenating well on room air.    1. Sprain of calcaneofibular ligament of left ankle, initial encounter 2. [redacted] weeks gestation of pregnancy Suspect ankle strain X-ray imaging deferred given current pregnancy and no known injury Recommended rest, ice, compression, elevation Ace wrap applied today Can take Tylenol as needed for pain Recommended follow-up with orthopedic provider with persistent or worsening symptoms despite treatment   The patient was given the opportunity to ask questions.  All questions answered to their satisfaction.  The patient is in agreement to this plan.    Final Clinical Impressions(s) / UC Diagnoses   Final diagnoses:  Sprain of calcaneofibular ligament of left ankle, initial encounter  [redacted] weeks gestation of pregnancy     Discharge Instructions      Your ankle is most likely sprained; please wear the ACE wrap whenever walking on your ankle Please rest your ankle and keep it elevated when sitting down Use ice 15 minutes on, 45 minutes off every hour while awake You can take Tylenol 500 mg every 6 hours as needed for pain When the pain goes down some, recommend starting ankle exercises Follow up with Orthopedic provider if ankle pain persists or worsens despite treatment    ED Prescriptions   None    PDMP not reviewed this encounter.   Eulogio Bear, NP 07/28/22 [phone]

## 2022-07-28 NOTE — Telephone Encounter (Signed)
Preadmission screen  

## 2022-07-30 ENCOUNTER — Ambulatory Visit: Payer: BC Managed Care – PPO | Attending: Obstetrics

## 2022-07-30 ENCOUNTER — Ambulatory Visit: Payer: BC Managed Care – PPO | Admitting: *Deleted

## 2022-07-30 VITALS — BP 117/72 | HR 101

## 2022-07-30 DIAGNOSIS — O09293 Supervision of pregnancy with other poor reproductive or obstetric history, third trimester: Secondary | ICD-10-CM

## 2022-07-30 DIAGNOSIS — O99213 Obesity complicating pregnancy, third trimester: Secondary | ICD-10-CM | POA: Insufficient documentation

## 2022-07-30 DIAGNOSIS — Z3A38 38 weeks gestation of pregnancy: Secondary | ICD-10-CM

## 2022-07-30 DIAGNOSIS — E669 Obesity, unspecified: Secondary | ICD-10-CM | POA: Diagnosis not present

## 2022-07-30 DIAGNOSIS — O10913 Unspecified pre-existing hypertension complicating pregnancy, third trimester: Secondary | ICD-10-CM | POA: Insufficient documentation

## 2022-07-30 DIAGNOSIS — Z3480 Encounter for supervision of other normal pregnancy, unspecified trimester: Secondary | ICD-10-CM

## 2022-07-30 DIAGNOSIS — O10013 Pre-existing essential hypertension complicating pregnancy, third trimester: Secondary | ICD-10-CM | POA: Diagnosis not present

## 2022-07-30 DIAGNOSIS — O09299 Supervision of pregnancy with other poor reproductive or obstetric history, unspecified trimester: Secondary | ICD-10-CM | POA: Diagnosis present

## 2022-08-01 NOTE — Progress Notes (Signed)
   PRENATAL VISIT NOTE  Subjective:  Alexandra Collins is a 27 y.o. G3P1011 at [redacted]w[redacted]d being seen today for ongoing prenatal care.  She is currently monitored for the following issues for this high-risk pregnancy and has Obesity in pregnancy; History of pre-eclampsia; Suppurative hidradenitis; Acne; Supervision of other normal pregnancy, antepartum; Fetal arrhythmia before the onset of labor; Chronic hypertension in pregnancy; and BMI 40.0-44.9, adult ([facility]) on their problem list.  Patient reports occasional contractions.  Contractions: Irritability. Vag. Bleeding: None.  Movement: Present. Denies leaking of fluid.   The following portions of the patient's history were reviewed and updated as appropriate: allergies, current medications, past family history, past medical history, past social history, past surgical history and problem list.   Objective:   Vitals:   08/02/22 0833  BP: 131/82  Pulse: 93  Weight: 295 lb 1.6 oz (133.9 kg)    Fetal Status: Fetal Heart Rate (bpm): 132 Fundal Height: 38 cm Movement: Present  Presentation: Vertex  General:  Alert, oriented and cooperative. Patient is in no acute distress.  Skin: Skin is warm and dry. No rash noted.   Cardiovascular: Normal heart rate noted  Respiratory: Normal respiratory effort, no problems with respiration noted  Abdomen: Soft, gravid, appropriate for gestational age.  Pain/Pressure: Present     Pelvic: Cervical exam performed in the presence of a chaperone Dilation: 1 Effacement (%): 50 Station: -2  Extremities: Normal range of motion.  Edema: None  Mental Status: Normal mood and affect. Normal behavior. Normal judgment and thought content.   Assessment and Plan:  Pregnancy: G3P1011 at [redacted]w[redacted]d 1. Supervision of high risk pregnancy in third trimester --Anticipatory guidance about next visits/weeks of pregnancy given.  --Desires low intervention, felt like nurses pushed the epidural with her last pregnancy and she did not have a  choice.  Discussed how this may have been related to BP, but pt should have options.  Desires nitrous if medication needed, and freedom of movement during labor. Offered hydrotherapy with the shower if appropriate monitoring can be performed.   --Membranes swept today with midnight IOL on 3/28.   --Reviewed labor readiness with patient including the [facility] [employer], evening primrose oil, and raspberry leaf tea.    2. Chronic hypertension in pregnancy --On Procardia, BP wnl, no s/sx of PEC --IOL scheduled 08/05/22  3. History of pre-eclampsia   4. BMI 40.0-44.9, adult ([facility])   5. [redacted] weeks gestation of pregnancy   Term labor symptoms and general obstetric precautions including but not limited to vaginal bleeding, contractions, leaking of fluid and fetal movement were reviewed in detail with the patient. Please refer to After Visit Summary for other counseling recommendations.   Return for for IOL as scheduled at 11:45 on 08/04/22 (midnight 11/28).  Future Appointments  Date Time Provider Department Center  08/05/2022 12:00 AM [facility] SCHED ROOM [facility] None  09/17/2022  2:00 PM Tobb, Lavona [place], DO [facility] None    Sharen Counter, CNM

## 2022-08-02 ENCOUNTER — Ambulatory Visit (INDEPENDENT_AMBULATORY_CARE_PROVIDER_SITE_OTHER): Payer: BC Managed Care – PPO | Admitting: Advanced Practice Midwife

## 2022-08-02 ENCOUNTER — Other Ambulatory Visit: Payer: Self-pay

## 2022-08-02 ENCOUNTER — Encounter: Payer: Self-pay | Admitting: Advanced Practice Midwife

## 2022-08-02 VITALS — BP 131/82 | HR 93 | Wt 295.1 lb

## 2022-08-02 DIAGNOSIS — Z8759 Personal history of other complications of pregnancy, childbirth and the puerperium: Secondary | ICD-10-CM

## 2022-08-02 DIAGNOSIS — O0993 Supervision of high risk pregnancy, unspecified, third trimester: Secondary | ICD-10-CM

## 2022-08-02 DIAGNOSIS — Z3A38 38 weeks gestation of pregnancy: Secondary | ICD-10-CM

## 2022-08-02 DIAGNOSIS — O10913 Unspecified pre-existing hypertension complicating pregnancy, third trimester: Secondary | ICD-10-CM

## 2022-08-02 DIAGNOSIS — Z6841 Body Mass Index (BMI) 40.0 and over, adult: Secondary | ICD-10-CM

## 2022-08-02 DIAGNOSIS — O10919 Unspecified pre-existing hypertension complicating pregnancy, unspecified trimester: Secondary | ICD-10-CM

## 2022-08-05 ENCOUNTER — Other Ambulatory Visit: Payer: Self-pay

## 2022-08-05 ENCOUNTER — Inpatient Hospital Stay (HOSPITAL_COMMUNITY): Payer: BC Managed Care – PPO

## 2022-08-05 ENCOUNTER — Encounter (HOSPITAL_COMMUNITY): Payer: Self-pay | Admitting: Family Medicine

## 2022-08-05 ENCOUNTER — Inpatient Hospital Stay (HOSPITAL_COMMUNITY)
Admission: RE | Admit: 2022-08-05 | Discharge: 2022-08-07 | DRG: 807 | Disposition: A | Discharge status: Home / Self Care | Payer: BC Managed Care – PPO | Attending: Family Medicine | Admitting: Family Medicine

## 2022-08-05 DIAGNOSIS — O1002 Pre-existing essential hypertension complicating childbirth: Secondary | ICD-10-CM | POA: Diagnosis present

## 2022-08-05 DIAGNOSIS — Z3480 Encounter for supervision of other normal pregnancy, unspecified trimester: Principal | ICD-10-CM

## 2022-08-05 DIAGNOSIS — O10919 Unspecified pre-existing hypertension complicating pregnancy, unspecified trimester: Secondary | ICD-10-CM

## 2022-08-05 DIAGNOSIS — O10913 Unspecified pre-existing hypertension complicating pregnancy, third trimester: Secondary | ICD-10-CM | POA: Diagnosis present

## 2022-08-05 DIAGNOSIS — Z3A39 39 weeks gestation of pregnancy: Secondary | ICD-10-CM

## 2022-08-05 DIAGNOSIS — O99214 Obesity complicating childbirth: Secondary | ICD-10-CM | POA: Diagnosis present

## 2022-08-05 DIAGNOSIS — O1092 Unspecified pre-existing hypertension complicating childbirth: Secondary | ICD-10-CM | POA: Diagnosis not present

## 2022-08-05 LAB — COMPREHENSIVE METABOLIC PANEL
ALT: 10 U/L (ref 0–44)
AST: 18 U/L (ref 15–41)
Albumin: 2.7 g/dL — ABNORMAL LOW (ref 3.5–5.0)
Alkaline Phosphatase: 142 U/L — ABNORMAL HIGH (ref 38–126)
Anion gap: 11 (ref 5–15)
BUN: 7 mg/dL (ref 6–20)
CO2: 19 mmol/L — ABNORMAL LOW (ref 22–32)
Calcium: 8.9 mg/dL (ref 8.9–10.3)
Chloride: 103 mmol/L (ref 98–111)
Creatinine, Ser: 0.61 mg/dL (ref 0.44–1.00)
GFR, Estimated: >60 mL/min (ref >60)
Glucose, Bld: 82 mg/dL (ref 70–99)
Potassium: 3.9 mmol/L (ref 3.5–5.1)
Sodium: 133 mmol/L — ABNORMAL LOW (ref 135–145)
Total Bilirubin: 0.4 mg/dL (ref 0.3–1.2)
Total Protein: 6.5 g/dL (ref 6.5–8.1)

## 2022-08-05 LAB — PROTEIN / CREATININE RATIO, URINE
Creatinine, Urine: 62 mg/dL
Protein Creatinine Ratio: 0.1 mg/mg{Cre} (ref 0.00–0.15)
Total Protein, Urine: 6 mg/dL

## 2022-08-05 LAB — CBC
HCT: 30.9 % — ABNORMAL LOW (ref 36.0–46.0)
Hemoglobin: 10.7 g/dL — ABNORMAL LOW (ref 12.0–15.0)
MCH: 29.8 pg (ref 26.0–34.0)
MCHC: 34.6 g/dL (ref 30.0–36.0)
MCV: 86.1 fL (ref 80.0–100.0)
Platelets: 356 10*3/uL (ref 150–400)
RBC: 3.59 MIL/uL — ABNORMAL LOW (ref 3.87–5.11)
RDW: 12.5 % (ref 11.5–15.5)
WBC: 7.1 10*3/uL (ref 4.0–10.5)
nRBC: 0 % (ref 0.0–0.2)

## 2022-08-05 LAB — TYPE AND SCREEN
ABO/RH(D): O POS
Antibody Screen: NEGATIVE

## 2022-08-05 LAB — RPR: RPR Ser Ql: NONREACTIVE

## 2022-08-05 MED ORDER — LACTATED RINGERS IV SOLN: 500.0000 mL | INTRAVENOUS | Status: DC | PRN

## 2022-08-05 MED ORDER — OXYTOCIN BOLUS FROM INFUSION
333.0000 mL | Freq: Once | INTRAVENOUS | Status: AC
  Administered 2022-08-06: 333 mL via INTRAVENOUS

## 2022-08-05 MED ORDER — MISOPROSTOL 25 MCG QUARTER TABLET
25.0000 ug | ORAL_TABLET | Freq: Once | ORAL | Status: AC
  Administered 2022-08-05: 25 ug via ORAL
  Filled 2022-08-05: qty 1

## 2022-08-05 MED ORDER — LACTATED RINGERS IV SOLN: INTRAVENOUS | Status: DC

## 2022-08-05 MED ORDER — OXYCODONE-ACETAMINOPHEN 5-325 MG PO TABS: 1.0000 | ORAL_TABLET | ORAL | Status: DC | PRN

## 2022-08-05 MED ORDER — LIDOCAINE HCL (PF) 1 % IJ SOLN: 30.0000 mL | INTRAMUSCULAR | Status: DC | PRN

## 2022-08-05 MED ORDER — OXYCODONE-ACETAMINOPHEN 5-325 MG PO TABS: 2.0000 | ORAL_TABLET | ORAL | Status: DC | PRN

## 2022-08-05 MED ORDER — OXYTOCIN-SODIUM CHLORIDE 30-0.9 UT/500ML-% IV SOLN
2.5000 [IU]/h | INTRAVENOUS | Status: DC
  Administered 2022-08-06: 2.5 [IU]/h via INTRAVENOUS
  Filled 2022-08-05: qty 500

## 2022-08-05 MED ORDER — ACETAMINOPHEN 325 MG PO TABS: 650.0000 mg | ORAL_TABLET | ORAL | Status: DC | PRN

## 2022-08-05 MED ORDER — MISOPROSTOL 50MCG HALF TABLET
50.0000 ug | ORAL_TABLET | Freq: Once | ORAL | Status: AC
  Administered 2022-08-05: 50 ug via ORAL
  Filled 2022-08-05: qty 1

## 2022-08-05 MED ORDER — MISOPROSTOL 50MCG HALF TABLET
50.0000 ug | ORAL_TABLET | ORAL | Status: DC
  Administered 2022-08-05 (×2): 50 ug via BUCCAL
  Filled 2022-08-05 (×2): qty 1

## 2022-08-05 MED ORDER — MISOPROSTOL 25 MCG QUARTER TABLET
25.0000 ug | ORAL_TABLET | Freq: Once | ORAL | Status: AC
  Administered 2022-08-05: 25 ug via VAGINAL
  Filled 2022-08-05: qty 1

## 2022-08-05 MED ORDER — SOD CITRATE-CITRIC ACID 500-334 MG/5ML PO SOLN: 30.0000 mL | ORAL | Status: DC | PRN

## 2022-08-05 MED ORDER — ONDANSETRON HCL 4 MG/2ML IJ SOLN
4.0000 mg | Freq: Four times a day (QID) | INTRAMUSCULAR | Status: DC | PRN
  Administered 2022-08-05: 4 mg via INTRAVENOUS
  Filled 2022-08-05: qty 2

## 2022-08-05 MED ORDER — TERBUTALINE SULFATE 1 MG/ML IJ SOLN: 0.2500 mg | Freq: Once | INTRAMUSCULAR | Status: DC | PRN

## 2022-08-05 NOTE — Progress Notes (Signed)
Patient Vitals for the past 4 hrs:  BP Pulse  08/05/22 1922 (!) 127/55 83  08/05/22 1730 133/65 85   States ctx seem to have increased in the past hour or two.  Feels like buccal cytotec had bigger response than oral/vaginal. Cx 2/50/-2, very posterior, pt rather uncomfortable w/exam.  FHR Cat 1.  Will give another buccal cytotec, plan AROM in 4 hours.

## 2022-08-05 NOTE — H&P (Signed)
Alexandra Collins is a 27 y.o. female presenting for Induction of Labor for Chronic Hypertension in Pregnancy.  Has been followed by Dr Harriet Masson for Hypertension,   This has been treated with Procardia and Baby ASA.Marland Kitchen They recently added Labetalol, but patient states she was told not to take it.  Patient Active Problem List   Diagnosis Date Noted   Chronic hypertension in obstetric context in third trimester 08/05/2022   BMI 40.0-44.9, adult ([place]) 07/07/2022   Chronic hypertension in pregnancy 06/24/2022   Fetal arrhythmia before the onset of labor 06/22/2022   Supervision of other normal pregnancy, antepartum 01/21/2022   History of pre-eclampsia 08/24/2019   Obesity in pregnancy 12/21/2018   Acne 02/26/2013   Suppurative hidradenitis 07/26/2012    OB History     Gravida  3   Para  1   Term  1   Preterm      AB  1   Living  1      SAB  1   IAB      Ectopic      Multiple  0   Live Births  1          Past Medical History:  Diagnosis Date   Anemia    Chronic hypertension 06/22/2022   Hydradenitis    Pregnancy induced hypertension    Spinal headache    Past Surgical History:  Procedure Laterality Date   KNEE ARTHROSCOPY Right    Family History: family history includes Asthma in her father, maternal aunt, and sister; Hypertension in her father and mother; Seizures in her father. Social History:  reports that she has never smoked. She has never used smokeless tobacco. She reports that she does not drink alcohol and does not use drugs.     Maternal Diabetes: No Genetic Screening: Normal Maternal Ultrasounds/Referrals: Normal Fetal Ultrasounds or other Referrals:  None Maternal Substance Abuse:  No Significant Maternal Medications:  Meds include: Other:  Significant Maternal Lab Results:  Group B Strep negative Number of Prenatal Visits:greater than 3 verified prenatal visits Other Comments:   Chronic Hypertension on Nifedipine  Review of Systems   Constitutional:  Negative for chills and fever.  Respiratory:  Negative for shortness of breath.   Cardiovascular:  Negative for leg swelling.  Gastrointestinal:  Negative for abdominal pain, nausea and vomiting.  Genitourinary:  Negative for vaginal bleeding.  Neurological:  Negative for headaches.   Maternal Medical History:  Reason for admission: Nausea. Induction of Labor for Chronic Hypertension  Contractions: Frequency: irregular.   Perceived severity is mild.   Fetal activity: Perceived fetal activity is normal.   Last perceived fetal movement was within the past hour.    Dilation: 1 Effacement (%): 50 Station: -3 Exam by:: Hansel Feinstein, CNM Last menstrual period 11/03/2021. Maternal Exam:  Uterine Assessment: Contraction strength is mild.  Contraction frequency is irregular.  Abdomen: Patient reports no abdominal tenderness. Fetal presentation: vertex Introitus: Normal vulva. Normal vagina.  Ferning test: not done.  Nitrazine test: not done. Pelvis: adequate for delivery.   Cervix: Cervix evaluated by digital exam.     Fetal Exam Fetal Monitor Review: Mode: ultrasound.   Baseline rate: 125.  Variability: moderate (6-25 bpm).   Pattern: no decelerations and accelerations present.   Fetal State Assessment: Category I - tracings are normal.   Physical Exam Constitutional:      General: She is not in acute distress.    Appearance: She is not ill-appearing or toxic-appearing.  HENT:  Head: Normocephalic.  Cardiovascular:     Rate and Rhythm: Normal rate.  Pulmonary:     Effort: Pulmonary effort is normal.  Abdominal:     General: There is no distension.     Tenderness: There is no abdominal tenderness. There is no guarding.  Genitourinary:    General: Normal vulva.     Comments: Dilation: 1 Effacement (%): 50 Station: -3 Exam by:: Hansel Feinstein, CNM  Musculoskeletal:        General: Normal range of motion.  Skin:    General: Skin is warm and  dry.  Neurological:     General: No focal deficit present.     Mental Status: She is alert.  Psychiatric:        Mood and Affect: Mood normal.     Prenatal labs: ABO, Rh: O/Positive/-- (09/28 1644) Antibody: Negative (09/28 1644) Rubella: 3.47 (09/28 1644) RPR: Non Reactive (01/10 1612)  HBsAg: Negative (09/28 1644)  HIV: Non Reactive (01/10 1612)  GBS: Negative/-- (03/12 1700)   Assessment/Plan: Single IUP at [redacted]w[redacted]d Chronic Hypertension  Admit to Labor and Delivery Routine orders DIscussed options for induction including Foley, Cytotec and Pitocin Patient agrees to Cytotec but does not want Foley or Pitocin. Declines epidural or any discussion of it (had a spinal headache after last one) Prefers low intervention birth Anticipate SVD    Hansel Feinstein 08/05/2022, 1:23 AM

## 2022-08-05 NOTE — Progress Notes (Signed)
LABOR PROGRESS NOTE  Alexandra Collins is a 27 y.o. G3P1011 at [redacted]w[redacted]d  admitted for IOL for cHTN.  Subjective: Comfortable, not feeling contractions  Objective: BP (!) 122/55   Pulse 78   Resp 18   Ht 5' 6.5" (1.689 m)   Wt 135.1 kg   LMP 11/03/2021   BMI 47.35 kg/m  or  Vitals:   08/05/22 0827 08/05/22 0943 08/05/22 1052 08/05/22 1308  BP: 120/61 137/71 135/65 (!) 122/55  Pulse: 90 76 78 78  Resp: 18     Weight:      Height:        Dilation: 1.5 Effacement (%): 60 Cervical Position: Middle Station: -3, -2 Presentation: Vertex Exam by:: Gifford Shave FHT: baseline rate 130, moderate varibility, + acel, no decel Toco: difficult to trace  Labs: Lab Results  Component Value Date   WBC 7.1 08/05/2022   HGB 10.7 (L) 08/05/2022   HCT 30.9 (L) 08/05/2022   MCV 86.1 08/05/2022   PLT 356 08/05/2022    Patient Active Problem List   Diagnosis Date Noted   Chronic hypertension in obstetric context in third trimester 08/05/2022   BMI 40.0-44.9, adult ([place]) 07/07/2022   Chronic hypertension in pregnancy 06/24/2022   Fetal arrhythmia before the onset of labor 06/22/2022   Supervision of other normal pregnancy, antepartum 01/21/2022   History of pre-eclampsia 08/24/2019   Obesity in pregnancy 12/21/2018   Acne 02/26/2013   Suppurative hidradenitis 07/26/2012    Assessment / Plan: 27 y.o. G3P1011 at [redacted]w[redacted]d here for IOL for gHTN   Labor: Slow progression. Discussed cytotec and FB. Patient agreeable  to cytotec but no FB. Redose cytotec 50 mcg buccal. Consider pitocin at next check.  Fetal Wellbeing:  Cat 1 Pain Control:  per patient  Anticipated MOD:  vaginal   Gifford Shave, MD  OB Fellow  08/05/2022, 1:54 PM

## 2022-08-05 NOTE — Progress Notes (Signed)
Patient ID: Alexandra Collins, female   DOB: 06/27/1995, 27 y.o.   MRN: [id number] Starting to feel contractions more  Vitals:   08/05/22 0030 08/05/22 0112  BP:  119/74  Pulse:  72  Weight: 135.1 kg   Height: 5' 6.5" (1.689 m)    FHR reassuring UCs q4-87min  Dilation: 2 Effacement (%): 60 Cervical Position: Middle Station: -2, -3 Presentation: Vertex Exam by:: Jimmye Norman CNM  Will give one more dose of Cytotec PO Declines Pitocin or Foley bulb

## 2022-08-06 ENCOUNTER — Inpatient Hospital Stay (HOSPITAL_COMMUNITY): Payer: BC Managed Care – PPO | Admitting: Anesthesiology

## 2022-08-06 ENCOUNTER — Encounter (HOSPITAL_COMMUNITY): Payer: Self-pay | Admitting: Family Medicine

## 2022-08-06 DIAGNOSIS — Z3A39 39 weeks gestation of pregnancy: Secondary | ICD-10-CM

## 2022-08-06 DIAGNOSIS — O1092 Unspecified pre-existing hypertension complicating childbirth: Secondary | ICD-10-CM

## 2022-08-06 LAB — CBC
HCT: 33 % — ABNORMAL LOW (ref 36.0–46.0)
Hemoglobin: 10.9 g/dL — ABNORMAL LOW (ref 12.0–15.0)
MCH: 29.2 pg (ref 26.0–34.0)
MCHC: 33 g/dL (ref 30.0–36.0)
MCV: 88.5 fL (ref 80.0–100.0)
Platelets: 410 10*3/uL — ABNORMAL HIGH (ref 150–400)
RBC: 3.73 MIL/uL — ABNORMAL LOW (ref 3.87–5.11)
RDW: 12.7 % (ref 11.5–15.5)
WBC: 7.6 10*3/uL (ref 4.0–10.5)
nRBC: 0 % (ref 0.0–0.2)

## 2022-08-06 MED ORDER — MEASLES, MUMPS & RUBELLA VAC IJ SOLR: 0.5000 mL | Freq: Once | INTRAMUSCULAR | Status: DC

## 2022-08-06 MED ORDER — ACETAMINOPHEN 325 MG PO TABS
650.0000 mg | ORAL_TABLET | ORAL | Status: DC | PRN
  Administered 2022-08-07: 650 mg via ORAL

## 2022-08-06 MED ORDER — FENTANYL CITRATE (PF) 100 MCG/2ML IJ SOLN
INTRAMUSCULAR | Status: AC
  Filled 2022-08-06: qty 2

## 2022-08-06 MED ORDER — MEDROXYPROGESTERONE ACETATE 150 MG/ML IM SUSP: 150.0000 mg | INTRAMUSCULAR | Status: DC | PRN

## 2022-08-06 MED ORDER — DIPHENHYDRAMINE HCL 50 MG/ML IJ SOLN: 12.5000 mg | INTRAMUSCULAR | Status: DC | PRN

## 2022-08-06 MED ORDER — COCONUT OIL OIL: 1.0000 | TOPICAL_OIL | Status: DC | PRN

## 2022-08-06 MED ORDER — SIMETHICONE 80 MG PO CHEW: 80.0000 mg | CHEWABLE_TABLET | ORAL | Status: DC | PRN

## 2022-08-06 MED ORDER — FENTANYL-BUPIVACAINE-NACL 0.5-0.125-0.9 MG/250ML-% EP SOLN
12.0000 mL/h | EPIDURAL | Status: DC | PRN
  Administered 2022-08-06: 12 mL/h via EPIDURAL
  Filled 2022-08-06: qty 250

## 2022-08-06 MED ORDER — PRENATAL MULTIVITAMIN CH
1.0000 | ORAL_TABLET | Freq: Every day | ORAL | Status: DC
  Administered 2022-08-07: 1 via ORAL
  Filled 2022-08-06 (×2): qty 1

## 2022-08-06 MED ORDER — DIPHENHYDRAMINE HCL 25 MG PO CAPS: 25.0000 mg | ORAL_CAPSULE | Freq: Four times a day (QID) | ORAL | Status: DC | PRN

## 2022-08-06 MED ORDER — BISACODYL 10 MG RE SUPP: 10.0000 mg | Freq: Every day | RECTAL | Status: DC | PRN

## 2022-08-06 MED ORDER — METHYLERGONOVINE MALEATE 0.2 MG PO TABS: 0.2000 mg | ORAL_TABLET | ORAL | Status: DC | PRN

## 2022-08-06 MED ORDER — EPHEDRINE 5 MG/ML INJ: 10.0000 mg | INTRAVENOUS | Status: DC | PRN

## 2022-08-06 MED ORDER — FENTANYL CITRATE (PF) 100 MCG/2ML IJ SOLN
100.0000 ug | INTRAMUSCULAR | Status: DC | PRN
  Administered 2022-08-06: 100 ug via INTRAVENOUS

## 2022-08-06 MED ORDER — FLEET ENEMA 7-19 GM/118ML RE ENEM: 1.0000 | ENEMA | Freq: Every day | RECTAL | Status: DC | PRN

## 2022-08-06 MED ORDER — PHENYLEPHRINE 80 MCG/ML (10ML) SYRINGE FOR IV PUSH (FOR BLOOD PRESSURE SUPPORT): 80.0000 ug | PREFILLED_SYRINGE | INTRAVENOUS | Status: DC | PRN

## 2022-08-06 MED ORDER — METHYLERGONOVINE MALEATE 0.2 MG/ML IJ SOLN: 0.2000 mg | INTRAMUSCULAR | Status: DC | PRN

## 2022-08-06 MED ORDER — WITCH HAZEL-GLYCERIN EX PADS: 1.0000 | MEDICATED_PAD | CUTANEOUS | Status: DC | PRN

## 2022-08-06 MED ORDER — TETANUS-DIPHTH-ACELL PERTUSSIS 5-2.5-18.5 LF-MCG/0.5 IM SUSY: 0.5000 mL | PREFILLED_SYRINGE | Freq: Once | INTRAMUSCULAR | Status: DC

## 2022-08-06 MED ORDER — ONDANSETRON HCL 4 MG PO TABS: 4.0000 mg | ORAL_TABLET | ORAL | Status: DC | PRN

## 2022-08-06 MED ORDER — DIBUCAINE (PERIANAL) 1 % EX OINT: 1.0000 | TOPICAL_OINTMENT | CUTANEOUS | Status: DC | PRN

## 2022-08-06 MED ORDER — IBUPROFEN 600 MG PO TABS
600.0000 mg | ORAL_TABLET | Freq: Four times a day (QID) | ORAL | Status: DC
  Administered 2022-08-06 – 2022-08-07 (×5): 600 mg via ORAL
  Filled 2022-08-06 (×5): qty 1

## 2022-08-06 MED ORDER — LIDOCAINE HCL (PF) 1 % IJ SOLN
INTRAMUSCULAR | Status: DC | PRN
  Administered 2022-08-06 (×2): 4 mL via EPIDURAL

## 2022-08-06 MED ORDER — SENNOSIDES-DOCUSATE SODIUM 8.6-50 MG PO TABS
2.0000 | ORAL_TABLET | ORAL | Status: DC
  Administered 2022-08-07: 2 via ORAL
  Filled 2022-08-06: qty 2

## 2022-08-06 MED ORDER — ONDANSETRON HCL 4 MG/2ML IJ SOLN
4.0000 mg | INTRAMUSCULAR | Status: DC | PRN
  Administered 2022-08-06: 4 mg via INTRAVENOUS
  Filled 2022-08-06: qty 2

## 2022-08-06 MED ORDER — FERROUS SULFATE 325 (65 FE) MG PO TABS
325.0000 mg | ORAL_TABLET | ORAL | Status: DC
  Administered 2022-08-06: 325 mg via ORAL
  Filled 2022-08-06: qty 1

## 2022-08-06 MED ORDER — LACTATED RINGERS IV SOLN
500.0000 mL | Freq: Once | INTRAVENOUS | Status: AC
  Administered 2022-08-06: 500 mL via INTRAVENOUS

## 2022-08-06 MED ORDER — BENZOCAINE-MENTHOL 20-0.5 % EX AERO
1.0000 | INHALATION_SPRAY | CUTANEOUS | Status: DC | PRN
  Administered 2022-08-06: 1 via TOPICAL
  Filled 2022-08-06: qty 56

## 2022-08-06 NOTE — Progress Notes (Signed)
   Ctx getting stronger per pt.  FHR difficult to trace, dropping/skipping beats and EFM registering differently from what is heard (anywhere from 50 bpm to 170 bpm).  This was heard at some PNVs.  Cx 3/70/-2. AROM w/FSE, clear fluid.  FHR 150s, moderate variability, + accels, no decels.  Ctx q 2-5 minutes. Expectant mgt at this time.

## 2022-08-06 NOTE — Lactation Note (Signed)
This note was copied from a baby's chart. Lactation Consultation Note  Patient Name: Alexandra Collins [id number] Date: 08/06/2022 Age:26 hours Reason for consult: Initial assessment  P2, Mother resting and baby sleeping.  Mother stated she had difficulty breastfeeding her first child.  She states this child is latching better.  Allowed mother to rest, provided lactation information sheet and lactation will follow up.  Maternal Data Does the patient have breastfeeding experience prior to this delivery?: Yes  Feeding Mother's Current Feeding Choice: Breast Milk   Interventions Interventions: [facility] brochure  Discharge    Consult Status Consult Status: Follow-up Date: 08/07/22 Follow-up type: In-patient    Vivianne Master [facility] : [facility] 08/06/2022, 9:18 AM

## 2022-08-06 NOTE — Anesthesia Preprocedure Evaluation (Signed)
Anesthesia Evaluation  Patient identified by MRN, date of birth, ID band Patient awake    Reviewed: Allergy & Precautions, Patient's Chart, lab work & pertinent test results  History of Anesthesia Complications (+) POST - OP SPINAL HEADACHE and history of anesthetic complications  Airway Mallampati: II  TM Distance: >3 FB Neck ROM: Full    Dental no notable dental hx.    Pulmonary neg pulmonary ROS   Pulmonary exam normal        Cardiovascular hypertension, Pt. on medications Normal cardiovascular exam     Neuro/Psych  Headaches  negative psych ROS   GI/Hepatic negative GI ROS, Neg liver ROS,,,  Endo/Other    Morbid obesity  Renal/GU negative Renal ROS  negative genitourinary   Musculoskeletal negative musculoskeletal ROS (+)    Abdominal   Peds  Hematology  (+) Blood dyscrasia (Hgb 10.9), anemia   Anesthesia Other Findings Day of surgery medications reviewed with patient.  Reproductive/Obstetrics (+) Pregnancy                             Anesthesia Physical Anesthesia Plan  ASA: 3  Anesthesia Plan: Epidural   Post-op Pain Management:    Induction:   PONV Risk Score and Plan: Treatment may vary due to age or medical condition  Airway Management Planned: Natural Airway  Additional Equipment: Fetal Monitoring  Intra-op Plan:   Post-operative Plan:   Informed Consent: I have reviewed the patients History and Physical, chart, labs and discussed the procedure including the risks, benefits and alternatives for the proposed anesthesia with the patient or authorized representative who has indicated his/her understanding and acceptance.       Plan Discussed with:   Anesthesia Plan Comments:        Anesthesia Quick Evaluation

## 2022-08-06 NOTE — Lactation Note (Signed)
This note was copied from a baby's chart. Lactation Consultation Note  Patient Name: Alexandra Collins [id number] Date: 08/06/2022 Age:26 hours  Reason for consult: Follow-up assessment;Term;Breastfeeding assistance  P2, sleepy baby  Mother states baby latched well after delivery and sleepy since. She is currently attempting to latch.   Reviewed hand expression and informed mother if baby isn't latching, she can hand express and feed baby by spoon to entice and provide calories and place baby skin to skin. Discussed watching for feeding cues and make efforts to feed baby within 3 hours. Goal for feedings after 24 hrs is 8-12/24 hrs.  Changed baby's diaper and he started rooting. Baby to breast in football hold and milk hand expressed to entice baby. After a few attempts baby latched well and sucking with tugs on the breast and bursts of swallows. Basic breastfeeding education related to positioning, attachment, and nutritive feeding. Mother denied discomfort. Baby was still feeding when [place] left room.  Instructed mother to call RN/LC for assistance with breastfeeding as needed.   Maternal Data Has patient been taught Hand Expression?: Yes Does the patient have breastfeeding experience prior to this delivery?: No How long did the patient breastfeed?: per mom baby didn't latch due to lip and tongue tie, mother readmitted to hospital and lost milk supply  Feeding Mother's Current Feeding Choice: Breast Milk  LATCH Score Latch: Grasps breast easily, tongue down, lips flanged, rhythmical sucking.  Audible Swallowing: A few with stimulation  Type of Nipple: Everted at rest and after stimulation  Comfort (Breast/Nipple): Soft / non-tender  Hold (Positioning): Assistance needed to correctly position infant at breast and maintain latch.  LATCH Score: 8   Interventions Interventions: Breast feeding basics reviewed;Assisted with latch;Skin to skin;Hand express;Breast compression;Adjust  position;Support pillows;Position options;Education  Discharge Pump: DEBP;Personal  Consult Status Consult Status: Follow-up Date: 08/07/22 Follow-up type: In-patient    Stana Bunting M 08/06/2022, 5:08 PM

## 2022-08-06 NOTE — Anesthesia Procedure Notes (Signed)
Epidural Patient location during procedure: OB Start time: 08/06/2022 3:08 AM End time: 08/06/2022 3:11 AM  Staffing Anesthesiologist: Brennan Bailey, MD Performed: anesthesiologist   Preanesthetic Checklist Completed: patient identified, IV checked, risks and benefits discussed, monitors and equipment checked, pre-op evaluation and timeout performed  Epidural Patient position: sitting Prep: DuraPrep and site prepped and draped Patient monitoring: continuous pulse ox, blood pressure and heart rate Approach: midline Location: L3-L4 Injection technique: LOR air  Needle:  Needle type: Tuohy  Needle gauge: 17 G Needle length: 9 cm Needle insertion depth: 9 cm Catheter type: closed end flexible Catheter size: 19 Gauge Catheter at skin depth: 15 cm Test dose: negative and Other (1% lidocaine)  Assessment Events: blood not aspirated, no cerebrospinal fluid, injection not painful, no injection resistance, no paresthesia and negative IV test  Additional Notes Patient identified. Risks, benefits, and alternatives discussed with patient including but not limited to bleeding, infection, nerve damage, paralysis, failed block, incomplete pain control, headache, blood pressure changes, nausea, vomiting, reactions to medication, itching, and postpartum back pain. Confirmed with bedside nurse the patient's most recent platelet count. Confirmed with patient that they are not currently taking any anticoagulation, have any bleeding history, or any family history of bleeding disorders. Patient expressed understanding and wished to proceed. All questions were answered. Sterile technique was used throughout the entire procedure. Please see nursing notes for vital signs.   Crisp LOR on first pass. Test dose was given through epidural catheter and negative prior to continuing to dose epidural or start infusion. Warning signs of high block given to the patient including shortness of breath,  tingling/numbness in hands, complete motor block, or any concerning symptoms with instructions to call for help. Patient was given instructions on fall risk and not to get out of bed. All questions and concerns addressed with instructions to call with any issues or inadequate analgesia.  Reason for block:procedure for pain

## 2022-08-06 NOTE — Discharge Summary (Signed)
Postpartum Discharge Summary  Date of Service updated 08/07/22     Patient Name: Alexandra Collins DOB: 06/02/1995 MRN: [id number]  Date of admission: 08/05/2022 Delivery date:08/06/2022  Delivering provider: Christin Fudge  Date of discharge: 08/07/2022  Admitting diagnosis: Chronic hypertension in obstetric context in third trimester [O10.913] Intrauterine pregnancy: [redacted]w[redacted]d     Secondary diagnosis:  Principal Problem:   Chronic hypertension in obstetric context in third trimester  Additional problems: none    Discharge diagnosis: Term Pregnancy Delivered and CHTN                                              Post partum procedures: Augmentation: AROM and Cytotec Complications: None  Hospital course: Induction of Labor With Vaginal Delivery   27 y.o. yo G3P1011 at [redacted]w[redacted]d was admitted to the hospital 08/05/2022 for induction of labor.  Indication for induction:  CHTN .  Patient had an labor course complicated by precipitous delivery Membrane Rupture Time/Date: 12:03 AM ,08/06/2022   Delivery Method:Vaginal, Spontaneous  Episiotomy: None  Lacerations:  None  Details of delivery can be found in separate delivery note.  Patient had a postpartum course that was uncomplicated. Patient is discharged home 08/07/22.  Newborn Data: Birth date:08/06/2022  Birth time:4:15 AM  Gender:Female  Living status:Living  Apgars:8 ,9  Weight:3360 g   Magnesium Sulfate received: No BMZ received: No Rhophylac:N/A MMR:N/A T-DaP:Given prenatally Flu: No Transfusion:No  Physical exam  Vitals:   08/06/22 1640 08/06/22 2040 08/07/22 0548 08/07/22 1009  BP: 129/66 (!) 130/57 (!) 107/48 111/73  Pulse: 69 68 63 74  Resp: 18 17 16    Temp: 97.8 F (36.6 C) 97.7 F (36.5 C) 98.1 F (36.7 C)   TempSrc: Oral Oral Oral   SpO2: 100% 100% 100% 100%  Weight:      Height:       General: alert, cooperative, and no distress Lochia: appropriate Uterine Fundus: firm Incision: N/A DVT Evaluation:  No evidence of DVT seen on physical exam. No significant calf/ankle edema. Labs: Lab Results  Component Value Date   WBC 7.6 08/06/2022   HGB 10.9 (L) 08/06/2022   HCT 33.0 (L) 08/06/2022   MCV 88.5 08/06/2022   PLT 410 (H) 08/06/2022      Latest Ref Rng & Units 08/05/2022    1:14 AM  CMP  Glucose 70 - 99 mg/dL 82   BUN 6 - 20 mg/dL 7   Creatinine 0.44 - 1.00 mg/dL 0.61   Sodium 135 - 145 mmol/L 133   Potassium 3.5 - 5.1 mmol/L 3.9   Chloride 98 - 111 mmol/L 103   CO2 22 - 32 mmol/L 19   Calcium 8.9 - 10.3 mg/dL 8.9   Total Protein 6.5 - 8.1 g/dL 6.5   Total Bilirubin 0.3 - 1.2 mg/dL 0.4   Alkaline Phos 38 - 126 U/L 142   AST 15 - 41 U/L 18   ALT 0 - 44 U/L 10    Edinburgh Score:    08/06/2022    7:20 PM  Edinburgh Postnatal Depression Scale Screening Tool  I have been able to laugh and see the funny side of things. 0  I have looked forward with enjoyment to things. 0  I have blamed myself unnecessarily when things went wrong. 1  I have been anxious or worried for no good reason. 1  I  have felt scared or panicky for no good reason. 0  Things have been getting on top of me. 1  I have been so unhappy that I have had difficulty sleeping. 0  I have felt sad or miserable. 0  I have been so unhappy that I have been crying. 0  The thought of harming myself has occurred to me. 0  Edinburgh Postnatal Depression Scale Total 3     After visit meds:  Allergies as of 08/07/2022       Reactions   Ceclor [cefaclor] Hives   Elemental Sulfur Hives   Garlic Hives   Only allergic to raw garlic   Penicillins Hives   Has patient had a PCN reaction causing immediate rash, facial/tongue/throat swelling, SOB or lightheadedness with hypotension: Yes Has patient had a PCN reaction causing severe rash involving mucus membranes or skin necrosis: Yes Has patient had a PCN reaction that required hospitalization: No Has patient had a PCN reaction occurring within the last 10 years: No If  all of the above answers are "NO", then may proceed with Cephalosporin use.   Shellfish Allergy Itching   Sulfamethoxazole Nausea And Vomiting   Shrimp Extract Hives   Augmentin [amoxicillin-pot Clavulanate] Rash   Other Itching        Medication List     STOP taking these medications    aspirin EC 81 MG tablet       TAKE these medications    acetaminophen 325 MG tablet Commonly known as: Tylenol Take 2 tablets (650 mg total) by mouth every 4 (four) hours as needed (for pain scale < 4).   ferrous sulfate 325 (65 FE) MG tablet Take 1 tablet (325 mg total) by mouth every other day. Start taking on: August 08, 2022   furosemide 20 MG tablet Commonly known as: LASIX Take 1 tablet (20 mg total) by mouth daily. Start taking on: August 08, 2022   ibuprofen 600 MG tablet Commonly known as: ADVIL Take 1 tablet (600 mg total) by mouth every 6 (six) hours.   NIFEdipine 30 MG 24 hr tablet Commonly known as: ADALAT CC Take 1 tablet (30 mg total) by mouth daily. Start taking on: August 08, 2022 What changed:  medication strength how much to take   PRENATAL COMPLETE PO Take by mouth.         Discharge home in stable condition Infant Feeding: Breast Infant Disposition:home with mother Discharge instruction: per After Visit Summary and Postpartum booklet. Activity: Advance as tolerated. Pelvic rest for 6 weeks.  Diet: routine diet Future Appointments: Future Appointments  Date Time Provider [place]  09/17/2022  2:00 PM Tobb, Kardie, DO [facility] None   Follow up Visit:   Please schedule this patient for a In person postpartum visit in 4 weeks with the following provider: Any provider. Additional Postpartum F/U:BP check 1 week  Low risk pregnancy complicated by: HTN Delivery mode:  Vaginal, Spontaneous  Anticipated Birth Control:   vacsectomy  Pt moved to [place], wants to transfer care to our office 08/07/2022 Arlyce Dice, MD

## 2022-08-06 NOTE — Progress Notes (Signed)
Patient Vitals for the past 4 hrs:  BP Pulse SpO2  08/06/22 0326 135/69 66 --  08/06/22 0323 138/63 69 --  08/06/22 0311 (!) 148/90 (!) 110 --  08/06/22 0300 -- -- 100 %  08/06/22 0250 -- -- 100 %  08/06/22 0247 138/73 70 --  08/06/22 0245 -- -- 99 %  08/06/22 0236 (!) 156/108 86 --  08/06/22 0120 -- -- 100 %  08/06/22 0115 -- -- 99 %  08/06/22 0105 -- -- 99 %  08/06/22 0100 -- -- 100 %  08/06/22 0055 -- -- 99 %  08/06/22 0050 -- -- 100 %  08/06/22 0045 -- -- 99 %  08/06/22 0040 -- -- 100 %  08/06/22 0035 -- -- 100 %  08/06/22 0030 -- -- 100 %  08/06/22 0025 -- -- 100 %  08/06/22 0020 (!) 130/58 72 99 %  08/06/22 0015 -- -- 100 %  08/06/22 0010 -- -- 100 %  08/06/22 0005 -- -- 100 %  08/06/22 0000 -- -- 100 %  08/05/22 2350 -- -- 100 %  08/05/22 2345 -- -- 99 %  08/05/22 2340 -- -- 100 %  08/05/22 2336 -- -- 100 %  08/05/22 2335 -- -- 100 %   Requested epidural.  Cx 4.5/80/-2.  FHR Cat 1. Ctx q 2-3  minutes.

## 2022-08-07 MED ORDER — IBUPROFEN 600 MG PO TABS: 600.0000 mg | ORAL_TABLET | Freq: Four times a day (QID) | ORAL | 0 refills | Status: DC

## 2022-08-07 MED ORDER — NIFEDIPINE ER OSMOTIC RELEASE 30 MG PO TB24
30.0000 mg | ORAL_TABLET | Freq: Every day | ORAL | Status: DC
  Administered 2022-08-07: 30 mg via ORAL
  Filled 2022-08-07: qty 1

## 2022-08-07 MED ORDER — ACETAMINOPHEN 325 MG PO TABS: 650.0000 mg | ORAL_TABLET | ORAL | 0 refills | Status: DC | PRN

## 2022-08-07 MED ORDER — NIFEDIPINE ER 30 MG PO TB24: 30.0000 mg | ORAL_TABLET | Freq: Every day | ORAL | 0 refills | Status: DC

## 2022-08-07 MED ORDER — FERROUS SULFATE 325 (65 FE) MG PO TABS: 325.0000 mg | ORAL_TABLET | ORAL | 0 refills | Status: DC

## 2022-08-07 MED ORDER — FUROSEMIDE 20 MG PO TABS
20.0000 mg | ORAL_TABLET | Freq: Every day | ORAL | Status: DC
  Administered 2022-08-07: 20 mg via ORAL
  Filled 2022-08-07: qty 1

## 2022-08-07 MED ORDER — FUROSEMIDE 20 MG PO TABS: 20.0000 mg | ORAL_TABLET | Freq: Every day | ORAL | 0 refills | Status: DC

## 2022-08-07 NOTE — Lactation Note (Signed)
This note was copied from a baby's chart. Lactation Consultation Note  Patient Name: Alexandra Collins [id number] Date: 08/07/2022 Age:26 hours Reason for consult: Breastfeeding assistance;Term  LC student entered room to provide discharge education. Encouraged MOB to reach out to outpatient lactation consultants if questions or concerns arise. Observed a latch, score: 10.   Discussed the following:  - Engorgement and mastitis prevention and treatment - Outpatient resources - When to call pediatrician  - output expectations - feeding on demand  Feeding plan: Latch infant on demand or 8-12x/24hr.   Emphasized rest,diet and hydration. Congratulated MOB on newborn.  Maternal Data  27 year old, [redacted]w[redacted]d, G19P2.   Feeding Mother's Current Feeding Choice: Breast Milk  LATCH Score Latch: Grasps breast easily, tongue down, lips flanged, rhythmical sucking.  Audible Swallowing: Spontaneous and intermittent  Type of Nipple: Everted at rest and after stimulation  Comfort (Breast/Nipple): Soft / non-tender  Hold (Positioning): No assistance needed to correctly position infant at breast.  LATCH Score: 10   Interventions Interventions: Breast feeding basics reviewed;Education  Discharge Discharge Education: Engorgement and breast care;Warning signs for feeding baby;Outpatient recommendation  Consult Status Consult Status: Complete Date: 08/07/22 Follow-up type: Out-patient    Quentez Lober 08/07/2022, 5:46 PM

## 2022-08-07 NOTE — Progress Notes (Signed)
POSTPARTUM PROGRESS NOTE  Subjective: Alexandra Collins is a 27 y.o. [id number] s/p SVD at [redacted]w[redacted]d.  She reports she doing well. No acute events overnight. She denies any problems with ambulating, voiding or po intake. Denies nausea or vomiting. She has passed flatus. Pain is well controlled.  Lochia is appropriate and improving.  Objective: Blood pressure (!) 107/48, pulse 63, temperature 98.1 F (36.7 C), temperature source Oral, resp. rate 16, height 5' 6.5" (1.689 m), weight 135.1 kg, last menstrual period 11/03/2021, SpO2 100 %, unknown if currently breastfeeding.  Physical Exam:  General: alert, cooperative and no distress Chest: no respiratory distress Abdomen: soft, non-tender  Uterine Fundus: firm and at level of umbilicus Extremities: No calf swelling or tenderness  no edema  Recent Labs    08/05/22 0114 08/06/22 0202  HGB 10.7* 10.9*  HCT 30.9* 33.0*    Assessment/Plan: Alexandra Collins is a 27 y.o. [id number] s/p SVD at [redacted]w[redacted]d for IOL for gHTN.  Routine Postpartum Care: Doing well, pain well-controlled.  -- Continue routine care, lactation support  -- Contraception: partner vasectomy -- Feeding: breast  Dispo: Plan for discharge later today (after circ) vs waiting until tomorrow.  Arlyce Dice, [place] [facility] 08/07/2022 9:26 AM

## 2022-08-07 NOTE — Anesthesia Postprocedure Evaluation (Signed)
Anesthesia Post Note  Patient: Baeley Orlikowski  Procedure(s) Performed: AN AD HOC LABOR EPIDURAL     Patient location during evaluation: Mother Baby Anesthesia Type: Epidural Level of consciousness: awake and alert and oriented Pain management: satisfactory to patient Vital Signs Assessment: post-procedure vital signs reviewed and stable Respiratory status: respiratory function stable Cardiovascular status: stable Postop Assessment: no headache, no backache, epidural receding, patient able to bend at knees, no signs of nausea or vomiting, adequate PO intake and able to ambulate Anesthetic complications: no   No notable events documented.  Last Vitals:  Vitals:   08/06/22 2040 08/07/22 0548  BP: (!) 130/57 (!) 107/48  Pulse: 68 63  Resp: 17 16  Temp: 36.5 C 36.7 C  SpO2: 100% 100%    Last Pain:  Vitals:   08/07/22 0743  TempSrc:   PainSc: 3    Pain Goal: Patients Stated Pain Goal: 0 (08/06/22 2040)              Epidural/Spinal Function Cutaneous sensation: Normal sensation (08/07/22 0743), Patient able to flex knees: Yes (08/07/22 0743), Patient able to lift hips off bed: Yes (08/07/22 0743), Back pain beyond tenderness at insertion site: No (08/07/22 0743), Progressively worsening motor and/or sensory loss: No (08/07/22 0743), Bowel and/or bladder incontinence post epidural: No (08/07/22 0743)  Katherina Mires

## 2022-08-09 DIAGNOSIS — Z419 Encounter for procedure for purposes other than remedying health state, unspecified: Secondary | ICD-10-CM | POA: Diagnosis not present

## 2022-08-16 ENCOUNTER — Ambulatory Visit: Payer: BC Managed Care – PPO | Admitting: Family Medicine

## 2022-08-17 ENCOUNTER — Telehealth (HOSPITAL_COMMUNITY): Payer: Self-pay | Admitting: *Deleted

## 2022-08-17 ENCOUNTER — Encounter: Payer: Self-pay | Admitting: Women's Health

## 2022-08-17 NOTE — Telephone Encounter (Signed)
Left phone voicemail message.  Duffy Rhody, RN 08-17-2022 at 11:24am

## 2022-08-25 ENCOUNTER — Ambulatory Visit (INDEPENDENT_AMBULATORY_CARE_PROVIDER_SITE_OTHER): Payer: BC Managed Care – PPO | Admitting: Family Medicine

## 2022-08-25 ENCOUNTER — Encounter: Payer: Self-pay | Admitting: Family Medicine

## 2022-08-25 VITALS — BP 128/72 | HR 76 | Temp 97.6°F | Ht 66.5 in | Wt 281.2 lb

## 2022-08-25 DIAGNOSIS — O1003 Pre-existing essential hypertension complicating the puerperium: Secondary | ICD-10-CM

## 2022-08-25 DIAGNOSIS — O10919 Unspecified pre-existing hypertension complicating pregnancy, unspecified trimester: Secondary | ICD-10-CM

## 2022-08-25 DIAGNOSIS — L732 Hidradenitis suppurativa: Secondary | ICD-10-CM | POA: Diagnosis not present

## 2022-08-25 DIAGNOSIS — N939 Abnormal uterine and vaginal bleeding, unspecified: Secondary | ICD-10-CM

## 2022-08-25 MED ORDER — CLINDAMYCIN PHOSPHATE 1 % EX GEL: Freq: Two times a day (BID) | CUTANEOUS | 1 refills | Status: AC

## 2022-08-25 NOTE — Patient Instructions (Addendum)
Continue nifedipine until follow-up with OB/GYN to discuss possible changes.  Blood pressure looks okay today.  I am glad to hear that the bleeding and cramping has improved but if that returns, call your OB/GYN or be seen through the ER at [facility].   I will refer you to dermatology, but I did see that sometimes you can have a postpartum flare of hidradenitis as it usually is better during pregnancy.  We are limited somewhat on medications with breast-feeding, but we can try clindamycin gel to the affected areas and open areas.  That can be applied twice per day.  If any worsening swelling on the areas, or worsening pain, I do recommend being seen.  See information below.  I will try to get you into dermatology as soon as I can. Return to the clinic or go to the nearest emergency room if any of your symptoms worsen or new symptoms occur.   Hidradenitis Suppurativa Hidradenitis suppurativa is a long-term (chronic) skin disease. It is similar to a severe form of acne, but it affects areas of the body where acne would be unusual, especially areas of the body where skin rubs against skin and becomes moist. These include: Underarms. Groin. Genital area. Buttocks. Upper thighs. Breasts. Hidradenitis suppurativa may start out as small lumps or pimples caused by blocked skin pores, sweat glands, or hair follicles. Pimples may develop into deep sores that break open (rupture) and drain pus. Over time, affected areas of skin may thicken and become scarred. This condition is rare and does not spread from person to person (non-contagious). What are the causes? The exact cause of this condition is not known. It may be related to: Female and female hormones. An overactive disease-fighting system (immune system). The immune system may over-react to blocked hair follicles or sweat glands and cause swelling and pus-filled sores. What increases the risk? You are more likely to develop this condition if  you: Are female. Are 92-44 years old. Have a family history of hidradenitis suppurativa. Have a personal history of acne. Are overweight. Smoke. Take the medicine lithium. What are the signs or symptoms? The first symptoms are usually painful bumps in the skin, similar to pimples. The condition may get worse over time (progress), or it may only cause mild symptoms. If the disease progresses, symptoms may include: Skin bumps getting bigger and growing deeper into the skin. Bumps rupturing and draining pus. Itchy, infected skin. Skin getting thicker and scarred. Tunnels under the skin (fistulas) where pus drains from a bump. Pain during daily activities, such as pain during walking if your groin area is affected. Emotional problems, such as stress or depression. This condition may affect your appearance and your ability or willingness to wear certain clothes or do certain activities. How is this diagnosed? This condition is diagnosed by a health care provider who specializes in skin conditions (dermatologist). You may be diagnosed based on: Your symptoms and medical history. A physical exam. Testing a pus sample for infection. Blood tests. How is this treated? Your treatment will depend on how severe your symptoms are. The same treatment will not work for everybody with this condition. You may need to try several treatments to find what works best for you. Treatment may include: Cleaning and bandaging (dressing) your wounds as needed. Lifestyle changes, such as new skin care routines. Taking medicines, such as: Antibiotics. Acne medicines. Medicines to reduce the activity of the immune system. A diabetes medicine (metformin). Birth control pills, for women.  Steroids to reduce swelling and pain. Working with a mental health care provider, if you experience emotional distress due to this condition. If you have severe symptoms that do not get better with medicine, you may need surgery.  Surgery may involve: Using a laser to clear the skin and remove hair follicles. Opening and draining deep sores. Removing the areas of skin that are diseased and scarred. Follow these instructions at home: Medicines  Take over-the-counter and prescription medicines only as told by your health care provider. If you were prescribed antibiotics, take them as told by your health care provider. Do not stop using the antibiotic even if your condition improves. Skin care If you have open wounds, cover them with a clean dressing as told by your health care provider. Keep wounds clean by washing them gently with soap and water when you bathe. Do not shave the areas where you get hidradenitis suppurativa. Wear loose-fitting clothes. Try to avoid getting overheated or sweaty. If you get sweaty or wet, change into clean, dry clothes as soon as you can. To help relieve pain and itchiness, cover sore areas with a warm, clean washcloth (warm compress) for 5-10 minutes as often as needed. Your healthcare provider may recommend an antiperspirant deodorant that may be gentle on your skin. A daily antiseptic wash to cleanse affected areas may be suggested by your healthcare provider. General instructions Learn as much as you can about your disease so that you have an active role in your treatment. Work closely with your health care provider to find treatments that work for you. If you are overweight, work with your health care provider to lose weight as recommended. Do not use any products that contain nicotine or tobacco. These products include cigarettes, chewing tobacco, and vaping devices, such as e-cigarettes. If you need help quitting, ask your health care provider. If you struggle with living with this condition, talk with your health care provider or work with a mental health care provider as recommended. Keep all follow-up visits. Where to find more information Hidradenitis Suppurativa Foundation,  Inc.: www.hs-foundation.org American Academy of Dermatology: InfoExam.si Contact a health care provider if: You have a flare-up of hidradenitis suppurativa. You have a fever or chills. You have trouble controlling your symptoms at home. You have trouble doing your daily activities because of your symptoms. You have trouble dealing with emotional problems related to your condition. Summary Hidradenitis suppurativa is a long-term (chronic) skin disease. It is similar to a severe form of acne, but it affects areas of the body where acne would be unusual. The first symptoms are usually painful bumps in the skin, similar to pimples. The condition may only cause mild symptoms, or it may get worse over time (progress). If you have open wounds, cover them with a clean dressing as told by your health care provider. Keep wounds clean by washing them gently with soap and water when you bathe. Besides skin care, treatment may include medicines, laser treatment, and surgery. This information is not intended to replace advice given to you by your health care provider. Make sure you discuss any questions you have with your health care provider. Document Revised: 06/17/2021 Document Reviewed: 06/17/2021 Elsevier Patient Education  2023 [employer].

## 2022-08-25 NOTE — Progress Notes (Signed)
Subjective:  Patient ID: Alexandra Collins, female    DOB: 05/14/1995  Age: 27 y.o. MRN: [id number]  CC:  Chief Complaint  Patient presents with   Hospitalization Follow-up    Pt had a blood clot come out post partem notes had gone to the hospital 2 weeks ago for this concern and her BP.    Referral    Pt needs referral to derm for hydradenitis suppurativa     HPI Alexandra Collins presents for   Hypertension: Chronic hypertension in third trimester, admitted March 28th for induction of labor.  Delivered baby boy on 3/29, [place]. Discharged home March 30.  Treated with nifedipine 30 mg daily   Plan for postpartum visit check, blood pressure check in 1 week. No BP meds prior to pregnancy.  GYN: [facility] - no scheduled appt - plans on appt at new [facility] in [place] - appt May 7th.  Hx of preeclampsia, not eclampsia.  No headache, vision changes. Home readings: 120/70's.no new side effects with meds.  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   BP Readings from Last 3 Encounters:  08/25/22 128/72  08/07/22 132/65  08/02/22 131/82   Lab Results  Component Value Date   CREATININE 0.61 08/05/2022    Postpartum vaginal bleeding [facility] note reviewed from April 9 with GYN. Larger blood clot on 4/19, after minimal bleeding prior, but had been kicked in stomach by dtr prior with some soreness.  Noted she had larger clot that night - size of an egg. Sent message to GYN, but no further large clots and cramping resolved so did not follow up with them.  Doing ok now- minimal spotting.    Hidradenitis: Hx of same in past. Had been minor, but noted more flare in groin and new R armpit since week of delivery. Treatment - none. Prior rx for bactroban - does not have.  No fever. No breast lesions.  R axilla p just starting, lower abdomen - open with some draining, sore - since week - epsom salt, no heating pad yet, R upper thigh - gets firm then softens - no discharge.      History Patient Active Problem List   Diagnosis Date Noted   Chronic hypertension in obstetric context in third trimester 08/05/2022   BMI 40.0-44.9, adult 07/07/2022   Chronic hypertension in pregnancy 06/24/2022   Fetal arrhythmia before the onset of labor 06/22/2022   Supervision of other normal pregnancy, antepartum 01/21/2022   History of pre-eclampsia 08/24/2019   Obesity in pregnancy 12/21/2018   Acne 02/26/2013   Suppurative hidradenitis 07/26/2012   Past Medical History:  Diagnosis Date   Anemia    Chronic hypertension 06/22/2022   Hydradenitis    Pregnancy induced hypertension    Spinal headache    Past Surgical History:  Procedure Laterality Date   KNEE ARTHROSCOPY Right    Allergies  Allergen Reactions   Ceclor [Cefaclor] Hives   Elemental Sulfur Hives   Garlic Hives    Only allergic to raw garlic   Penicillins Hives    Has patient had a PCN reaction causing immediate rash, facial/tongue/throat swelling, SOB or lightheadedness with hypotension: Yes Has patient had a PCN reaction causing severe rash involving mucus membranes or skin necrosis: Yes Has patient had a PCN reaction that required hospitalization: No Has patient had a PCN reaction occurring within the last 10 years: No If all of the above answers are "NO", then may proceed with Cephalosporin use.  Shellfish Allergy Itching   Sulfamethoxazole Nausea And Vomiting   Shrimp Extract Hives   Augmentin [Amoxicillin-Pot Clavulanate] Rash   Prior to Admission medications   Medication Sig Start Date End Date Taking? Authorizing Provider  ibuprofen (ADVIL) 600 MG tablet Take 1 tablet (600 mg total) by mouth every 6 (six) hours. 08/07/22  Yes Lincoln Brigham, MD  NIFEdipine (ADALAT CC) 30 MG 24 hr tablet Take 1 tablet (30 mg total) by mouth daily. 08/08/22  Yes Lincoln Brigham, MD  Prenat MV-Min w/Fe-Folate-DHA (PRENATAL COMPLETE PO) Take by mouth.   Yes [provider]  cetirizine (ZYRTEC ALLERGY)  10 MG tablet Take 1 tablet (10 mg total) by mouth daily. Patient not taking: Reported on 03/12/2020 01/04/20 04/29/20  Hall-Potvin, [place], PA-C  fluticasone (FLONASE) 50 MCG/ACT nasal spray Place 1 spray into both nostrils daily. Patient not taking: Reported on 03/12/2020 01/04/20 04/29/20  Hall-Potvin, [place], PA-C   Social History   Socioeconomic History   Marital status: Married    Spouse name: Not on file   Number of children: Not on file   Years of education: Not on file   Highest education level: Bachelor's degree (e.g., BA, AB, BS)  Occupational History   Not on file  Tobacco Use   Smoking status: Never   Smokeless tobacco: Never  Vaping Use   Vaping Use: Never used  Substance and Sexual Activity   Alcohol use: No   Drug use: No   Sexual activity: Yes    Birth control/protection: None  Other Topics Concern   Not on file  Social History Narrative   Not on file   Social Determinants of Health   Financial Resource Strain: Low Risk  (02/04/2022)   Overall Financial Resource Strain (CARDIA)    Difficulty of Paying Living Expenses: Not very hard  Food Insecurity: Food Insecurity Present (02/04/2022)   Hunger Vital Sign    Worried About Running Out of Food in the Last Year: Sometimes true    Ran Out of Food in the Last Year: Sometimes true  Transportation Needs: No Transportation Needs (02/04/2022)   PRAPARE - Administrator, Civil Service (Medical): No    Lack of Transportation (Non-Medical): No  Physical Activity: Unknown (02/04/2022)   Exercise Vital Sign    Days of Exercise per Week: 0 days    Minutes of Exercise per Session: Not on file  Stress: No Stress Concern Present (02/04/2022)   [employer] of Occupational Health - Occupational Stress Questionnaire    Feeling of Stress : Not at all  Social Connections: Moderately Integrated (02/04/2022)   Social Connection and Isolation Panel [NHANES]    Frequency of Communication with Friends and Family:  More than three times a week    Frequency of Social Gatherings with Friends and Family: Twice a week    Attends Religious Services: More than 4 times per year    Active Member of [employer] or Organizations: No    Attends Engineer, structural: Not on file    Marital Status: Married  Catering manager Violence: Not on file    Review of Systems Per HPI.   Objective:   Vitals:   08/25/22 1603  BP: 128/72  Pulse: 76  Temp: 97.6 F (36.4 C)  TempSrc: Temporal  SpO2: 99%  Weight: 281 lb 3.2 oz (127.6 kg)  Height: 5' 6.5" (1.689 m)     Physical Exam Vitals reviewed.  Constitutional:      Appearance: Normal appearance. She is well-developed.  HENT:     Head: Normocephalic and atraumatic.  Eyes:     Conjunctiva/sclera: Conjunctivae normal.     Pupils: Pupils are equal, round, and reactive to light.  Neck:     Vascular: No carotid bruit.  Cardiovascular:     Rate and Rhythm: Normal rate and regular rhythm.     Heart sounds: Normal heart sounds.  Pulmonary:     Effort: Pulmonary effort is normal.     Breath sounds: Normal breath sounds.  Abdominal:     Palpations: There is no pulsatile mass.     Tenderness: There is abdominal tenderness (in area of hidradenitis, see photo.  Lower abdominal wall, approx 2 cm of induration. small amount of clear to white discharge at surface, no significant exudate expressed. cx obtained.).  Musculoskeletal:     Right lower leg: No edema.     Left lower leg: No edema.  Skin:    General: Skin is warm and dry.  Neurological:     Mental Status: She is alert and oriented to person, place, and time.  Psychiatric:        Mood and Affect: Mood normal.        Behavior: Behavior normal.     Photos: R inguinal fold,lower abdomen-suprapubic, R axilla:      47 minutes spent during visit, including chart review, discharge summary review from recent hospitalization, note review on recent message to OB/GYN, discussion and review of options for  hidradenitis given current breast-feeding counseling and assimilation of information, exam, discussion of plan, and chart completion.   Assessment & Plan:  Alexandra Collins is a 27 y.o. female . Chronic hypertension in pregnancy  -Stable control in office at this time, continue nifedipine same dose, follow-up with OB/GYN as planned with ER/rtc precautions given.   Hidradenitis - Plan: WOUND CULTURE, clindamycin (CLINDAGEL) 1 % gel, Ambulatory referral to Dermatology  -Postpartum flare.  Early symptoms in the right axilla, small open area suprapubic, closed area right inguinal fold.  Appears to be typical hidradenitis.  Given current breastfeeding, will start topical clindamycin as no breast lesions.  Potentially could consider short-term doxycycline use of less than 3 weeks but could potentially need longer treatment so would recommend discussion of risk and benefits with dermatology, and possibly her child's pediatrician.  Will refer to dermatology to evaluate current symptoms as well as discuss continued treatment options.  Handout given with RTC/ER precautions if acute worsening.  Wound culture obtained from open wound at abdomen.  Vagina bleeding  -Larger clot with cramping as above on the ninth, has since improved.  Denies recent significant bleeding or cramping.  Continue routine follow-up with OB/GYN with ER precautions given.  Meds ordered this encounter  Medications   clindamycin (CLINDAGEL) 1 % gel    Sig: Apply topically 2 (two) times daily.    Dispense:  30 g    Refill:  1   Patient Instructions  Continue nifedipine until follow-up with OB/GYN to discuss possible changes.  Blood pressure looks okay today.  I am glad to hear that the bleeding and cramping has improved but if that returns, call your OB/GYN or be seen through the ER at [facility].   I will refer you to dermatology, but I did see that sometimes you can have a postpartum flare of hidradenitis as it usually is  better during pregnancy.  We are limited somewhat on medications with breast-feeding, but we can try clindamycin gel to the affected areas and open areas.  That can be applied twice per day.  If any worsening swelling on the areas, or worsening pain, I do recommend being seen.  See information below.  I will try to get you into dermatology as soon as I can. Return to the clinic or go to the nearest emergency room if any of your symptoms worsen or new symptoms occur.   Hidradenitis Suppurativa Hidradenitis suppurativa is a long-term (chronic) skin disease. It is similar to a severe form of acne, but it affects areas of the body where acne would be unusual, especially areas of the body where skin rubs against skin and becomes moist. These include: Underarms. Groin. Genital area. Buttocks. Upper thighs. Breasts. Hidradenitis suppurativa may start out as small lumps or pimples caused by blocked skin pores, sweat glands, or hair follicles. Pimples may develop into deep sores that break open (rupture) and drain pus. Over time, affected areas of skin may thicken and become scarred. This condition is rare and does not spread from person to person (non-contagious). What are the causes? The exact cause of this condition is not known. It may be related to: Female and female hormones. An overactive disease-fighting system (immune system). The immune system may over-react to blocked hair follicles or sweat glands and cause swelling and pus-filled sores. What increases the risk? You are more likely to develop this condition if you: Are female. Are 60-73 years old. Have a family history of hidradenitis suppurativa. Have a personal history of acne. Are overweight. Smoke. Take the medicine lithium. What are the signs or symptoms? The first symptoms are usually painful bumps in the skin, similar to pimples. The condition may get worse over time (progress), or it may only cause mild symptoms. If the disease  progresses, symptoms may include: Skin bumps getting bigger and growing deeper into the skin. Bumps rupturing and draining pus. Itchy, infected skin. Skin getting thicker and scarred. Tunnels under the skin (fistulas) where pus drains from a bump. Pain during daily activities, such as pain during walking if your groin area is affected. Emotional problems, such as stress or depression. This condition may affect your appearance and your ability or willingness to wear certain clothes or do certain activities. How is this diagnosed? This condition is diagnosed by a health care provider who specializes in skin conditions (dermatologist). You may be diagnosed based on: Your symptoms and medical history. A physical exam. Testing a pus sample for infection. Blood tests. How is this treated? Your treatment will depend on how severe your symptoms are. The same treatment will not work for everybody with this condition. You may need to try several treatments to find what works best for you. Treatment may include: Cleaning and bandaging (dressing) your wounds as needed. Lifestyle changes, such as new skin care routines. Taking medicines, such as: Antibiotics. Acne medicines. Medicines to reduce the activity of the immune system. A diabetes medicine (metformin). Birth control pills, for women. Steroids to reduce swelling and pain. Working with a mental health care provider, if you experience emotional distress due to this condition. If you have severe symptoms that do not get better with medicine, you may need surgery. Surgery may involve: Using a laser to clear the skin and remove hair follicles. Opening and draining deep sores. Removing the areas of skin that are diseased and scarred. Follow these instructions at home: Medicines  Take over-the-counter and prescription medicines only as told by your health care provider. If you were prescribed antibiotics, take them as told by your  health care  provider. Do not stop using the antibiotic even if your condition improves. Skin care If you have open wounds, cover them with a clean dressing as told by your health care provider. Keep wounds clean by washing them gently with soap and water when you bathe. Do not shave the areas where you get hidradenitis suppurativa. Wear loose-fitting clothes. Try to avoid getting overheated or sweaty. If you get sweaty or wet, change into clean, dry clothes as soon as you can. To help relieve pain and itchiness, cover sore areas with a warm, clean washcloth (warm compress) for 5-10 minutes as often as needed. Your healthcare provider may recommend an antiperspirant deodorant that may be gentle on your skin. A daily antiseptic wash to cleanse affected areas may be suggested by your healthcare provider. General instructions Learn as much as you can about your disease so that you have an active role in your treatment. Work closely with your health care provider to find treatments that work for you. If you are overweight, work with your health care provider to lose weight as recommended. Do not use any products that contain nicotine or tobacco. These products include cigarettes, chewing tobacco, and vaping devices, such as e-cigarettes. If you need help quitting, ask your health care provider. If you struggle with living with this condition, talk with your health care provider or work with a mental health care provider as recommended. Keep all follow-up visits. Where to find more information Hidradenitis Suppurativa Foundation, Inc.: www.hs-foundation.org American Academy of Dermatology: InfoExam.si Contact a health care provider if: You have a flare-up of hidradenitis suppurativa. You have a fever or chills. You have trouble controlling your symptoms at home. You have trouble doing your daily activities because of your symptoms. You have trouble dealing with emotional problems related to your  condition. Summary Hidradenitis suppurativa is a long-term (chronic) skin disease. It is similar to a severe form of acne, but it affects areas of the body where acne would be unusual. The first symptoms are usually painful bumps in the skin, similar to pimples. The condition may only cause mild symptoms, or it may get worse over time (progress). If you have open wounds, cover them with a clean dressing as told by your health care provider. Keep wounds clean by washing them gently with soap and water when you bathe. Besides skin care, treatment may include medicines, laser treatment, and surgery. This information is not intended to replace advice given to you by your health care provider. Make sure you discuss any questions you have with your health care provider. Document Revised: 06/17/2021 Document Reviewed: 06/17/2021 Elsevier Patient Education  2023 Elsevier Inc.       Signed,   Meredith Staggers, MD [facility], [facility] 08/25/22 4:24 PM

## 2022-08-28 LAB — WOUND CULTURE
MICRO NUMBER:: 14840615
SPECIMEN QUALITY:: ADEQUATE

## 2022-09-08 DIAGNOSIS — Z419 Encounter for procedure for purposes other than remedying health state, unspecified: Secondary | ICD-10-CM | POA: Diagnosis not present

## 2022-09-09 ENCOUNTER — Encounter (HOSPITAL_COMMUNITY): Payer: Self-pay | Admitting: Emergency Medicine

## 2022-09-09 ENCOUNTER — Emergency Department (HOSPITAL_COMMUNITY)
Admission: EM | Admit: 2022-09-09 | Discharge: 2022-09-09 | Disposition: A | Discharge status: Home / Self Care | Payer: BC Managed Care – PPO | Attending: Emergency Medicine | Admitting: Emergency Medicine

## 2022-09-09 ENCOUNTER — Other Ambulatory Visit: Payer: Self-pay

## 2022-09-09 DIAGNOSIS — R519 Headache, unspecified: Secondary | ICD-10-CM | POA: Insufficient documentation

## 2022-09-09 DIAGNOSIS — R112 Nausea with vomiting, unspecified: Secondary | ICD-10-CM | POA: Insufficient documentation

## 2022-09-09 LAB — URINALYSIS, ROUTINE W REFLEX MICROSCOPIC
Bilirubin Urine: NEGATIVE
Glucose, UA: NEGATIVE mg/dL
Hgb urine dipstick: NEGATIVE
Ketones, ur: NEGATIVE mg/dL
Nitrite: NEGATIVE
Protein, ur: NEGATIVE mg/dL
Specific Gravity, Urine: 1.019 (ref 1.005–1.030)
pH: 6 (ref 5.0–8.0)

## 2022-09-09 LAB — CBC WITH DIFFERENTIAL/PLATELET
Abs Immature Granulocytes: 0.01 10*3/uL (ref 0.00–0.07)
Basophils Absolute: 0 10*3/uL (ref 0.0–0.1)
Basophils Relative: 0 %
Eosinophils Absolute: 0 10*3/uL (ref 0.0–0.5)
Eosinophils Relative: 1 %
HCT: 34.8 % — ABNORMAL LOW (ref 36.0–46.0)
Hemoglobin: 11.3 g/dL — ABNORMAL LOW (ref 12.0–15.0)
Immature Granulocytes: 0 %
Lymphocytes Relative: 10 %
Lymphs Abs: 0.6 10*3/uL — ABNORMAL LOW (ref 0.7–4.0)
MCH: 29 pg (ref 26.0–34.0)
MCHC: 32.5 g/dL (ref 30.0–36.0)
MCV: 89.2 fL (ref 80.0–100.0)
Monocytes Absolute: 0.4 10*3/uL (ref 0.1–1.0)
Monocytes Relative: 6 %
Neutro Abs: 5 10*3/uL (ref 1.7–7.7)
Neutrophils Relative %: 83 %
Platelets: 337 10*3/uL (ref 150–400)
RBC: 3.9 MIL/uL (ref 3.87–5.11)
RDW: 13.5 % (ref 11.5–15.5)
WBC: 6 10*3/uL (ref 4.0–10.5)
nRBC: 0 % (ref 0.0–0.2)

## 2022-09-09 LAB — COMPREHENSIVE METABOLIC PANEL
ALT: 14 U/L (ref 0–44)
AST: 18 U/L (ref 15–41)
Albumin: 3.5 g/dL (ref 3.5–5.0)
Alkaline Phosphatase: 75 U/L (ref 38–126)
Anion gap: 6 (ref 5–15)
BUN: 16 mg/dL (ref 6–20)
CO2: 25 mmol/L (ref 22–32)
Calcium: 8.5 mg/dL — ABNORMAL LOW (ref 8.9–10.3)
Chloride: 106 mmol/L (ref 98–111)
Creatinine, Ser: 0.93 mg/dL (ref 0.44–1.00)
GFR, Estimated: >60 mL/min (ref >60)
Glucose, Bld: 98 mg/dL (ref 70–99)
Potassium: 3.7 mmol/L (ref 3.5–5.1)
Sodium: 137 mmol/L (ref 135–145)
Total Bilirubin: 0.8 mg/dL (ref 0.3–1.2)
Total Protein: 7.1 g/dL (ref 6.5–8.1)

## 2022-09-09 MED ORDER — METOCLOPRAMIDE HCL 5 MG/ML IJ SOLN
10.0000 mg | Freq: Once | INTRAMUSCULAR | Status: AC
  Administered 2022-09-09: 10 mg via INTRAVENOUS
  Filled 2022-09-09: qty 2

## 2022-09-09 MED ORDER — ONDANSETRON 4 MG PO TBDP: ORAL_TABLET | ORAL | 0 refills | Status: DC

## 2022-09-09 MED ORDER — LACTATED RINGERS IV BOLUS
1000.0000 mL | Freq: Once | INTRAVENOUS | Status: AC
  Administered 2022-09-09: 1000 mL via INTRAVENOUS

## 2022-09-09 MED ORDER — LACTATED RINGERS IV BOLUS: 1000.0000 mL | Freq: Once | INTRAVENOUS | Status: DC

## 2022-09-09 MED ORDER — PROCHLORPERAZINE EDISYLATE 10 MG/2ML IJ SOLN
10.0000 mg | Freq: Once | INTRAMUSCULAR | Status: AC
  Administered 2022-09-09: 10 mg via INTRAVENOUS
  Filled 2022-09-09: qty 2

## 2022-09-09 NOTE — ED Provider Notes (Signed)
[facility] EMERGENCY DEPARTMENT AT [facility] Provider Note   CSN: [id number] Arrival date & time: 09/09/22  7829     History  Chief Complaint  Patient presents with   Emesis   Headache    Alexandra Collins is a 27 y.o. female.  Here with a few hours of emesis. Vomiting seems to have caused her to have a headache. 4 weeks post partum. No known sick contacts. No fevers. Did eat a cheeseburger that was a couple days old, but was heated up and didn't look or taste abnormal at all.    Emesis Associated symptoms: headaches   Headache Associated symptoms: vomiting        Home Medications Prior to Admission medications   Medication Sig Start Date End Date Taking? Authorizing Provider  ondansetron (ZOFRAN-ODT) 4 MG disintegrating tablet 4mg  ODT q4 hours prn nausea/vomit 09/09/22  Yes Valentina Alcoser, Barbara Cower, MD  clindamycin (CLINDAGEL) 1 % gel Apply topically 2 (two) times daily. 08/25/22   Shade Flood, MD  ibuprofen (ADVIL) 600 MG tablet Take 1 tablet (600 mg total) by mouth every 6 (six) hours. 08/07/22   Lincoln Brigham, MD  NIFEdipine (ADALAT CC) 30 MG 24 hr tablet Take 1 tablet (30 mg total) by mouth daily. 08/08/22   Lincoln Brigham, MD  Prenat MV-Min w/Fe-Folate-DHA (PRENATAL COMPLETE PO) Take by mouth.    [provider]  cetirizine (ZYRTEC ALLERGY) 10 MG tablet Take 1 tablet (10 mg total) by mouth daily. Patient not taking: Reported on 03/12/2020 01/04/20 04/29/20  Hall-Potvin, [place], PA-C  fluticasone (FLONASE) 50 MCG/ACT nasal spray Place 1 spray into both nostrils daily. Patient not taking: Reported on 03/12/2020 01/04/20 04/29/20  Hall-Potvin, [place], PA-C      Allergies    Ceclor [cefaclor], Elemental sulfur, Garlic, Penicillins, Shellfish allergy, Sulfamethoxazole, Shrimp extract, and Augmentin [amoxicillin-pot clavulanate]    Review of Systems   Review of Systems  Gastrointestinal:  Positive for vomiting.  Neurological:  Positive for headaches.    Physical  Exam Updated Vital Signs BP 103/71   Pulse 69   Temp 99.1 F (37.3 C) (Oral)   Resp 18   Ht 5' 6.5" (1.689 m)   Wt 126.6 kg   LMP 11/03/2021   SpO2 100%   Breastfeeding Yes   BMI 44.36 kg/m  Physical Exam Vitals and nursing note reviewed.  Constitutional:      Appearance: She is well-developed.  HENT:     Head: Normocephalic and atraumatic.  Cardiovascular:     Rate and Rhythm: Normal rate and regular rhythm.  Pulmonary:     Effort: No respiratory distress.     Breath sounds: No stridor.  Abdominal:     General: There is no distension.  Musculoskeletal:     Cervical back: Normal range of motion.  Skin:    General: Skin is warm and dry.  Neurological:     Mental Status: She is alert.     ED Results / Procedures / Treatments   Labs (all labs ordered are listed, but only abnormal results are displayed) Labs Reviewed  CBC WITH DIFFERENTIAL/PLATELET - Abnormal; Notable for the following components:      Result Value   Hemoglobin 11.3 (*)    HCT 34.8 (*)    Lymphs Abs 0.6 (*)    All other components within normal limits  COMPREHENSIVE METABOLIC PANEL - Abnormal; Notable for the following components:   Calcium 8.5 (*)    All other components within normal limits  URINALYSIS, ROUTINE  W REFLEX MICROSCOPIC - Abnormal; Notable for the following components:   Leukocytes,Ua TRACE (*)    Bacteria, UA RARE (*)    All other components within normal limits    EKG None  Radiology No results found.  Procedures Procedures    Medications Ordered in ED Medications  lactated ringers bolus 1,000 mL (1,000 mLs Intravenous Not Given 09/09/22 0508)  lactated ringers bolus 1,000 mL (0 mLs Intravenous Stopped 09/09/22 0442)  metoCLOPramide (REGLAN) injection 10 mg (10 mg Intravenous Given 09/09/22 0323)  prochlorperazine (COMPAZINE) injection 10 mg (10 mg Intravenous Given 09/09/22 1610)    ED Course/ Medical Decision Making/ A&P                             Medical Decision  Making Amount and/or Complexity of Data Reviewed Labs: ordered.  Risk Prescription drug management.   Overall appears well. Improved nausea. No abdominal tenderness or lab abnormalities to suggest need for imaging or further workup at this time. Tolerating PO.     Final Clinical Impression(s) / ED Diagnoses Final diagnoses:  Nausea and vomiting, unspecified vomiting type    Rx / DC Orders ED Discharge Orders          Ordered    ondansetron (ZOFRAN-ODT) 4 MG disintegrating tablet        09/09/22 0613              Kalese Ensz, Barbara Cower, MD 09/09/22 [phone]

## 2022-09-09 NOTE — ED Notes (Signed)
Pt offered food and drink but began to vomit before being given it. Dr Clayborne Dana made aware.

## 2022-09-09 NOTE — ED Triage Notes (Signed)
Pt c/o N/V that started around 11pm tonight. Pt also c/o headache and HTN. Hx of HTN but unable to keep all of her medications down tonight. Pt is currently 4 wks postpartum.

## 2022-09-14 ENCOUNTER — Ambulatory Visit (INDEPENDENT_AMBULATORY_CARE_PROVIDER_SITE_OTHER): Payer: BC Managed Care – PPO | Admitting: Women's Health

## 2022-09-14 ENCOUNTER — Encounter: Payer: Self-pay | Admitting: Women's Health

## 2022-09-14 NOTE — Patient Instructions (Signed)
Tips To Increase Milk Supply Lots of water! Enough so that your urine is clear Plenty of calories, if you're not getting enough calories, your milk supply can decrease Breastfeed/pump often, every 2-3 hours x 20-30mins Fenugreek 3 pills 3 times a day, this may make your urine smell like maple syrup Mother's Milk Tea Lactation cookies, google for the recipe Real oatmeal Body Armor sports drinks Liquid Gold Greater Than hydration drink  

## 2022-09-14 NOTE — Progress Notes (Signed)
POSTPARTUM VISIT Patient name: Alexandra Collins MRN [id number]  Date of birth: Sep 22, 1995 Chief Complaint:   Postpartum Care  History of Present Illness:   Alexandra Collins is a 27 y.o. G55P2012 African American female being seen today for a postpartum visit. She is 5 weeks postpartum following a spontaneous vaginal delivery at 39.3 gestational weeks. IOL: yes, for chronic hypertension . Anesthesia: epidural.  Laceration: none.  Complications: none. Inpatient contraception: no.   Pregnancy complicated by [place] managed by Dr. Servando Salina . Tobacco use: no. Substance use disorder: no. Last pap smear: 02/04/22 and results were NILM w/ HRHPV not done. Next pap smear due: 2026 Patient's last menstrual period was 11/03/2021.  Postpartum course has been uncomplicated. Currently on nifedipine 30mg  daily, has f/u appt w/ Dr. Servando Salina on Friday. Was not on meds pre-pregnancy. Bleeding none. Bowel function is normal. Bladder function is normal. Urinary incontinence? no, fecal incontinence? no Patient is not sexually active. Last sexual activity: prior to birth of baby. Desired contraception: vasectomy-plans abstinence or condoms until he is cleared. Patient does not want a pregnancy in the future.  Desired family size is 2 children.   Upstream - 09/14/22 4010       Pregnancy Intention Screening   Does the patient want to become pregnant in the next year? No    Does the patient's partner want to become pregnant in the next year? No    Would the patient like to discuss contraceptive options today? No      Contraception Wrap Up   Current Method Abstinence    End Method Vasectomy    Contraception Counseling Provided Yes            The pregnancy intention screening data noted above was reviewed. Potential methods of contraception were discussed. The patient elected to proceed with Vasectomy.  Edinburgh Postpartum Depression Screening: negative  Edinburgh Postnatal Depression Scale - 09/14/22 0931       Edinburgh  Postnatal Depression Scale:  In the Past 7 Days   I have been able to laugh and see the funny side of things. 0    I have looked forward with enjoyment to things. 0    I have blamed myself unnecessarily when things went wrong. 0    I have been anxious or worried for no good reason. 0    I have felt scared or panicky for no good reason. 0    Things have been getting on top of me. 0    I have been so unhappy that I have had difficulty sleeping. 0    I have felt sad or miserable. 0    I have been so unhappy that I have been crying. 0    The thought of harming myself has occurred to me. 0    Edinburgh Postnatal Depression Scale Total 0                08/25/2022    4:04 PM 02/04/2022    3:37 PM 11/20/2019   11:28 AM 10/26/2019    8:31 AM  GAD 7 : Generalized Anxiety Score  Nervous, Anxious, on Edge 1 0 0 0  Control/stop worrying 0 0 0 0  Worry too much - different things 0 0 0 0  Trouble relaxing 0 0 0 0  Restless 0 0 0 0  Easily annoyed or irritable 0 0 0 0  Afraid - awful might happen 0 0 0 0  Total GAD 7 Score 1 0 0 0  Baby's course has been uncomplicated. Baby is feeding by breast and bottle: milk supply inadequate . Infant has a pediatrician/family doctor? Yes.  Childcare strategy if returning to work/school: family.  Pt has material needs met for her and baby: Yes.   Review of Systems:   Pertinent items are noted in HPI Denies Abnormal vaginal discharge w/ itching/odor/irritation, headaches, visual changes, shortness of breath, chest pain, abdominal pain, severe nausea/vomiting, or problems with urination or bowel movements. Pertinent History Reviewed:  Reviewed past medical,surgical, obstetrical and family history.  Reviewed problem list, medications and allergies. OB History  Gravida Para Term Preterm AB Living  3 2 2   1 2   SAB IAB Ectopic Multiple Live Births  1     0 2    # Outcome Date GA Lbr Len/2nd Weight Sex Delivery Anes PTL Lv  3 Term 08/06/22 [redacted]w[redacted]d  7 lb  6.5 oz (3.36 kg) M Vag-Spont EPI  LIV  2 Term 07/26/19 [redacted]w[redacted]d / 02:22 6 lb 13.9 oz (3.116 kg) F Vag-Spont EPI, Intrathecal  LIV     Birth Comments: moulding/caput  1 SAB            Physical Assessment:   Vitals:   09/14/22 0929  BP: 126/86  Weight: 278 lb (126.1 kg)  Height: 5\' 6"  (1.676 m)  Body mass index is 44.87 kg/m.       Physical Examination:   General appearance: alert, well appearing, and in no distress  Mental status: alert, oriented to person, place, and time  Skin: warm & dry   Cardiovascular: normal heart rate noted   Respiratory: normal respiratory effort, no distress   Breasts: deferred, no complaints   Abdomen: soft, non-tender   Pelvic: examination not indicated. Thin prep pap obtained: No  Rectal: not examined  Extremities: Edema: none   Chaperone: N/A         No results found for this or any previous visit (from the past 24 hour(s)).  Assessment & Plan:  1) Postpartum exam 2) 5 wks s/p spontaneous vaginal delivery after IOL for CHTN 3) breast & bottle feeding> milk tips given 4) Depression screening 5) Contraception counseling>condoms, vasectomy  Essential components of care per ACOG recommendations:  1.  Mood and well being:  If positive depression screen, discussed and plan developed.  If using tobacco we discussed reduction/cessation and risk of relapse If current substance abuse, we discussed and referral to local resources was offered.   2. Infant care and feeding:  If breastfeeding, discussed returning to work, pumping, breastfeeding-associated pain, guidance regarding return to fertility while lactating if not using another method. If needed, patient was provided with a letter to be allowed to pump q 2-3hrs to support lactation in a private location with access to a refrigerator to store breastmilk.   Recommended that all caregivers be immunized for flu, pertussis and other preventable communicable diseases If pt does not have material needs met  for her/baby, referred to local resources for help obtaining these.  3. Sexuality, contraception and birth spacing Provided guidance regarding sexuality, management of dyspareunia, and resumption of intercourse Discussed avoiding interpregnancy interval <69mths and recommended birth spacing of 18 months  4. Sleep and fatigue Discussed coping options for fatigue and sleep disruption Encouraged family/partner/community support of 4 hrs of uninterrupted sleep to help with mood and fatigue  5. Physical recovery  If pt had a C/S, assessed incisional pain and providing guidance on normal vs prolonged recovery If pt had a laceration, perineal healing  and pain reviewed.  If urinary or fecal incontinence, discussed management and referred to PT or uro/gyn if indicated  Patient is safe to resume physical activity. Discussed attainment of healthy weight.  6.  Chronic disease management Discussed pregnancy complications if any, and their implications for future childbearing and long-term maternal health. Review recommendations for prevention of recurrent pregnancy complications, such as 17 hydroxyprogesterone caproate to reduce risk for recurrent PTB not applicable, or aspirin to reduce risk of preeclampsia: doesn't plan any more children. Pt had GDM: no. If yes, 2hr GTT scheduled: not applicable. Reviewed medications and non-pregnant dosing including consideration of whether pt is breastfeeding using a reliable resource such as LactMed: not applicable Referred for f/u w/ PCP or subspecialist providers as indicated: yes  7. Health maintenance Mammogram at 26yo or earlier if indicated Pap smears as indicated  Meds: No orders of the defined types were placed in this encounter.   Follow-up: Return in about 1 year (around 09/14/2023) for Physical.   No orders of the defined types were placed in this encounter.   Cheral Marker CNM, [facility] 09/14/2022 10:03 AM

## 2022-09-17 ENCOUNTER — Ambulatory Visit (INDEPENDENT_AMBULATORY_CARE_PROVIDER_SITE_OTHER): Payer: BC Managed Care – PPO | Admitting: Cardiology

## 2022-09-17 ENCOUNTER — Encounter: Payer: Self-pay | Admitting: Cardiology

## 2022-09-17 VITALS — BP 120/76 | HR 64 | Ht 66.5 in | Wt 282.0 lb

## 2022-09-17 DIAGNOSIS — I10 Essential (primary) hypertension: Secondary | ICD-10-CM

## 2022-09-17 NOTE — Patient Instructions (Signed)
Medication Instructions:  Your physician recommends that you continue on your current medications as directed. Please refer to the Current Medication list given to you today.  *If you need a refill on your cardiac medications before your next appointment, please call your pharmacy*   Lab Work: None    Testing/Procedures: None   Follow-Up: At [facility], you and your health needs are our priority.  As part of our continuing mission to provide you with exceptional heart care, we have created designated Provider Care Teams.  These Care Teams include your primary Cardiologist (physician) and Advanced Practice Providers (APPs -  Physician Assistants and Nurse Practitioners) who all work together to provide you with the care you need, when you need it.   Your next appointment:   4 month(s)  Provider:   Thomasene Ripple, DO

## 2022-09-17 NOTE — Progress Notes (Signed)
Cardio-Obstetrics Clinic  Follow Up Note   Date:  09/17/2022   ID:  [name], DOB [birth year], MRN [id number]  PCP:  Ermelinda Das, MD   [facility] Providers Cardiologist:  Thomasene Ripple, DO  Electrophysiologist:  None        Referring MD: Ermelinda Das, MD   Chief Complaint: " I am ok"  History of Present Illness:    Alexandra Collins is a 27 y.o. female [G3P2012] who returns for follow up of hypertension.  Medical history includes obesity, chronic hypertension in pregnancy.   She is postpartum 6 weeks. No complaints at this time.   Prior CV Studies Reviewed: The following studies were reviewed today:   Past Medical History:  Diagnosis Date   Anemia    Chronic hypertension 06/22/2022   Hydradenitis    Pregnancy induced hypertension    Spinal headache     Past Surgical History:  Procedure Laterality Date   KNEE ARTHROSCOPY Right       OB History     Gravida  3   Para  2   Term  2   Preterm      AB  1   Living  2      SAB  1   IAB      Ectopic      Multiple  0   Live Births  2               Current Medications: Current Meds  Medication Sig   clindamycin (CLINDAGEL) 1 % gel Apply topically 2 (two) times daily.   ibuprofen (ADVIL) 600 MG tablet Take 1 tablet (600 mg total) by mouth every 6 (six) hours.   NIFEdipine (ADALAT CC) 30 MG 24 hr tablet Take 1 tablet (30 mg total) by mouth daily.   ondansetron (ZOFRAN-ODT) 4 MG disintegrating tablet 4mg  ODT q4 hours prn nausea/vomit   Prenat MV-Min w/Fe-Folate-DHA (PRENATAL COMPLETE PO) Take by mouth.     Allergies:   Ceclor [cefaclor], Elemental sulfur, Garlic, Penicillins, Shellfish allergy, Sulfamethoxazole, Shrimp extract, and Augmentin [amoxicillin-pot clavulanate]   Social History   Socioeconomic History   Marital status: Married    Spouse name: Not on file   Number of children: Not on file   Years of education: Not on file   Highest education level:  Bachelor's degree (e.g., BA, AB, BS)  Occupational History   Not on file  Tobacco Use   Smoking status: Never   Smokeless tobacco: Never  Vaping Use   Vaping Use: Never used  Substance and Sexual Activity   Alcohol use: No   Drug use: No   Sexual activity: Not Currently    Birth control/protection: None  Other Topics Concern   Not on file  Social History Narrative   Not on file   Social Determinants of Health   Financial Resource Strain: Low Risk  (02/04/2022)   Overall Financial Resource Strain (CARDIA)    Difficulty of Paying Living Expenses: Not very hard  Food Insecurity: Food Insecurity Present (02/04/2022)   Hunger Vital Sign    Worried About Running Out of Food in the Last Year: Sometimes true    Ran Out of Food in the Last Year: Sometimes true  Transportation Needs: No Transportation Needs (02/04/2022)   PRAPARE - Administrator, Civil Service (Medical): No    Lack of Transportation (Non-Medical): No  Physical Activity: Unknown (02/04/2022)   Exercise Vital Sign    Days of Exercise per  Week: 0 days    Minutes of Exercise per Session: Not on file  Stress: No Stress Concern Present (02/04/2022)   [employer] of Occupational Health - Occupational Stress Questionnaire    Feeling of Stress : Not at all  Social Connections: Moderately Integrated (02/04/2022)   Social Connection and Isolation Panel [NHANES]    Frequency of Communication with Friends and Family: More than three times a week    Frequency of Social Gatherings with Friends and Family: Twice a week    Attends Religious Services: More than 4 times per year    Active Member of [employer] or Organizations: No    Attends Engineer, structural: Not on file    Marital Status: Married      Family History  Problem Relation Age of Onset   Hypertension Mother    Asthma Father    Seizures Father    Hypertension Father    Asthma Sister    Asthma Maternal Aunt    Diabetes Neg Hx    Heart disease  Neg Hx    Stroke Neg Hx       ROS:   Please see the history of present illness.     All other systems reviewed and are negative.   Labs/EKG Reviewed:    EKG:   EKG is was not ordered today.    Recent Labs: 09/09/2022: ALT 14; BUN 16; Creatinine, Ser 0.93; Hemoglobin 11.3; Platelets 337; Potassium 3.7; Sodium 137   Recent Lipid Panel No results found for: "CHOL", "TRIG", "HDL", "CHOLHDL", "LDLCALC", "LDLDIRECT"  Physical Exam:    VS:  BP 120/76   Pulse 64   Ht 5' 6.5" (1.689 m)   Wt 282 lb (127.9 kg)   SpO2 98%   BMI 44.83 kg/m     Wt Readings from Last 3 Encounters:  09/17/22 282 lb (127.9 kg)  09/14/22 278 lb (126.1 kg)  09/09/22 279 lb (126.6 kg)       Risk Assessment/Risk Calculators:                 ASSESSMENT & PLAN:    Chronic hypertension  Morbid obesity  6 weeks postpartum, her blood pressure is at target today. Will keep her on the Nifedipine for now.  She will send me updates of her bp for two weeks.   She will follow up in 4 months - she plans to go back to work on June 3.  Patient Instructions  Medication Instructions:  Your physician recommends that you continue on your current medications as directed. Please refer to the Current Medication list given to you today.  *If you need a refill on your cardiac medications before your next appointment, please call your pharmacy*   Lab Work: None    Testing/Procedures: None   Follow-Up: At [facility], you and your health needs are our priority.  As part of our continuing mission to provide you with exceptional heart care, we have created designated Provider Care Teams.  These Care Teams include your primary Cardiologist (physician) and Advanced Practice Providers (APPs -  Physician Assistants and Nurse Practitioners) who all work together to provide you with the care you need, when you need it.   Your next appointment:   4 month(s)  Provider:   Thomasene Ripple, DO      Dispo:   No follow-ups on file.   Medication Adjustments/Labs and Tests Ordered: Current medicines are reviewed at length with the patient today.  Concerns regarding medicines are  outlined above.  Tests Ordered: No orders of the defined types were placed in this encounter.  Medication Changes: No orders of the defined types were placed in this encounter.

## 2022-10-01 ENCOUNTER — Encounter: Payer: Self-pay | Admitting: Cardiology

## 2022-10-05 ENCOUNTER — Encounter: Payer: Self-pay | Admitting: Family Medicine

## 2022-10-09 DIAGNOSIS — Z419 Encounter for procedure for purposes other than remedying health state, unspecified: Secondary | ICD-10-CM | POA: Diagnosis not present

## 2022-10-11 ENCOUNTER — Ambulatory Visit: Payer: BC Managed Care – PPO | Admitting: Family Medicine

## 2022-10-22 ENCOUNTER — Ambulatory Visit: Payer: BC Managed Care – PPO | Admitting: Family Medicine

## 2022-10-25 NOTE — Telephone Encounter (Signed)
error 

## 2022-11-08 DIAGNOSIS — Z419 Encounter for procedure for purposes other than remedying health state, unspecified: Secondary | ICD-10-CM | POA: Diagnosis not present

## 2022-12-09 ENCOUNTER — Encounter: Payer: Self-pay | Admitting: Cardiology

## 2022-12-09 DIAGNOSIS — Z419 Encounter for procedure for purposes other than remedying health state, unspecified: Secondary | ICD-10-CM | POA: Diagnosis not present

## 2023-01-09 DIAGNOSIS — Z419 Encounter for procedure for purposes other than remedying health state, unspecified: Secondary | ICD-10-CM | POA: Diagnosis not present

## 2023-02-08 DIAGNOSIS — Z419 Encounter for procedure for purposes other than remedying health state, unspecified: Secondary | ICD-10-CM | POA: Diagnosis not present

## 2023-03-11 DIAGNOSIS — Z419 Encounter for procedure for purposes other than remedying health state, unspecified: Secondary | ICD-10-CM | POA: Diagnosis not present

## 2023-04-10 DIAGNOSIS — Z419 Encounter for procedure for purposes other than remedying health state, unspecified: Secondary | ICD-10-CM | POA: Diagnosis not present

## 2023-05-11 DIAGNOSIS — Z419 Encounter for procedure for purposes other than remedying health state, unspecified: Secondary | ICD-10-CM | POA: Diagnosis not present

## 2023-05-12 ENCOUNTER — Ambulatory Visit (INDEPENDENT_AMBULATORY_CARE_PROVIDER_SITE_OTHER): Payer: Medicaid Other | Admitting: Family Medicine

## 2023-05-12 ENCOUNTER — Encounter: Payer: Self-pay | Admitting: Family Medicine

## 2023-05-12 VITALS — BP 118/74 | HR 77 | Temp 98.1°F | Ht 66.5 in | Wt 278.4 lb

## 2023-05-12 DIAGNOSIS — Z23 Encounter for immunization: Secondary | ICD-10-CM | POA: Diagnosis not present

## 2023-05-12 DIAGNOSIS — M7661 Achilles tendinitis, right leg: Secondary | ICD-10-CM | POA: Diagnosis not present

## 2023-05-12 DIAGNOSIS — Z8679 Personal history of other diseases of the circulatory system: Secondary | ICD-10-CM

## 2023-05-12 DIAGNOSIS — R590 Localized enlarged lymph nodes: Secondary | ICD-10-CM | POA: Diagnosis not present

## 2023-05-12 DIAGNOSIS — M7662 Achilles tendinitis, left leg: Secondary | ICD-10-CM

## 2023-05-12 MED ORDER — DOXYCYCLINE HYCLATE 100 MG PO TABS: 100.0000 mg | ORAL_TABLET | Freq: Two times a day (BID) | ORAL | 0 refills | Status: DC

## 2023-05-12 NOTE — Progress Notes (Signed)
 Subjective:  Patient ID: Alexandra Collins, female    DOB: 02-Sep-1995  Age: 28 y.o. MRN: [id number]  CC:  Chief Complaint  Patient presents with   Foot Problem    Notes bilateral foot pain concentrated in the heel area of both, would like to see podiatry as she notes the initial injury was 2 years ago    Mass    Pt notes a very mobile mass on the Rt side of the groin that has been present about 3 weeks, notes it is uncomfortable, notes it has not increased in size that she has noted, notes it is painful and can be difficult to extend leg in certain positions.      HPI Taren Dymek presents for   Groin mass Mobile mass on right sided groin for the past 3 weeks. Discomfort in same area. Possible slight increase in size. Pain radiates down to leg at times.  Worse at night.  No discharge. No skin changes, but hidradenitis next to it.  No new sexual contacts, vaginal discharge or areas of irritation/wounds.   She does have history of hidradenitis, evaluated in August with lower abdominal involvement at that time.  Referred to dermatology, clindamycin  gel prescribed at that time.  Foot pain Bilateral heel pain. Injury 2 years ago -stepping up at gym - doing step ups, felt pain in R achilles area, behind heel. No evaluation, no swelling. Hurt to dorsiflex foot. Persistent soreness. Some days better than others.  Past 6 months - left heel sore in same area, not as bad as R, no known injury. Sore behind ankle.   Hypertension: Discussed in April.  Chronic hypertension in third trimester of pregnancy., treated with nifedipine  30 mg daily.  New baby boy in March.  No blood pressure meds prior to pregnancy.  Continued on same dose nifedipine  at her April visit.  Off nifedipine  a few months after having her son. Was having normal BP's, off meds past 6 months.  Home readings: 124/unknown diastolics.  BP Readings from Last 3 Encounters:  05/12/23 118/74  09/17/22 120/76  09/14/22 126/86   Lab  Results  Component Value Date   CREATININE 0.93 09/09/2022      History Patient Active Problem List   Diagnosis Date Noted   BMI 40.0-44.9, adult (HCC) 07/07/2022   Chronic hypertension in pregnancy 06/24/2022   History of pre-eclampsia 08/24/2019   Acne 02/26/2013   Suppurative hidradenitis 07/26/2012   Past Medical History:  Diagnosis Date   Anemia    Chronic hypertension 06/22/2022   Hydradenitis    Pregnancy induced hypertension    Spinal headache    Past Surgical History:  Procedure Laterality Date   KNEE ARTHROSCOPY Right    Allergies  Allergen Reactions   Ceclor [Cefaclor] Hives   Elemental Sulfur Hives   Garlic Hives    Only allergic to raw garlic   Penicillins Hives    Has patient had a PCN reaction causing immediate rash, facial/tongue/throat swelling, SOB or lightheadedness with hypotension: Yes Has patient had a PCN reaction causing severe rash involving mucus membranes or skin necrosis: Yes Has patient had a PCN reaction that required hospitalization: No Has patient had a PCN reaction occurring within the last 10 years: No If all of the above answers are NO, then may proceed with Cephalosporin use.    Shellfish Allergy Itching   Sulfamethoxazole Nausea And Vomiting   Shrimp Extract Hives   Augmentin [Amoxicillin-Pot Clavulanate] Rash   Prior to Admission medications  Medication Sig Start Date End Date Taking? Authorizing Provider  clindamycin  (CLINDAGEL) 1 % gel Apply topically 2 (two) times daily. 08/25/22  Yes Levora Reyes SAUNDERS, MD  ibuprofen  (ADVIL ) 600 MG tablet Take 1 tablet (600 mg total) by mouth every 6 (six) hours. 08/07/22  Yes Elicia Hamlet, MD  NIFEdipine  (ADALAT  CC) 30 MG 24 hr tablet Take 1 tablet (30 mg total) by mouth daily. 08/08/22  Yes Elicia Hamlet, MD  ondansetron  (ZOFRAN -ODT) 4 MG disintegrating tablet 4mg  ODT q4 hours prn nausea/vomit 09/09/22  Yes Mesner, Selinda, MD  Prenat MV-Min w/Fe-Folate-DHA (PRENATAL COMPLETE PO) Take by mouth.    Yes [provider]  cetirizine  (ZYRTEC  ALLERGY) 10 MG tablet Take 1 tablet (10 mg total) by mouth daily. Patient not taking: Reported on 03/12/2020 01/04/20 04/29/20  Hall-Potvin, Brittany, PA-C  fluticasone  (FLONASE ) 50 MCG/ACT nasal spray Place 1 spray into both nostrils daily. Patient not taking: Reported on 03/12/2020 01/04/20 04/29/20  Hall-Potvin, Brittany, PA-C   Social History   Socioeconomic History   Marital status: Married    Spouse name: Not on file   Number of children: Not on file   Years of education: Not on file   Highest education level: Bachelor's degree (e.g., BA, AB, BS)  Occupational History   Not on file  Tobacco Use   Smoking status: Never   Smokeless tobacco: Never  Vaping Use   Vaping status: Never Used  Substance and Sexual Activity   Alcohol use: No   Drug use: No   Sexual activity: Not Currently    Birth control/protection: None  Other Topics Concern   Not on file  Social History Narrative   Not on file   Social Drivers of Health   Financial Resource Strain: Low Risk  (02/04/2022)   Overall Financial Resource Strain (CARDIA)    Difficulty of Paying Living Expenses: Not very hard  Food Insecurity: Food Insecurity Present (02/04/2022)   Hunger Vital Sign    Worried About Running Out of Food in the Last Year: Sometimes true    Ran Out of Food in the Last Year: Sometimes true  Transportation Needs: No Transportation Needs (02/04/2022)   PRAPARE - Administrator, Civil Service (Medical): No    Lack of Transportation (Non-Medical): No  Physical Activity: Unknown (02/04/2022)   Exercise Vital Sign    Days of Exercise per Week: 0 days    Minutes of Exercise per Session: Not on file  Stress: No Stress Concern Present (02/04/2022)   [employer] of Occupational Health - Occupational Stress Questionnaire    Feeling of Stress : Not at all  Social Connections: Moderately Integrated (02/04/2022)   Social Connection and Isolation  Panel [NHANES]    Frequency of Communication with Friends and Family: More than three times a week    Frequency of Social Gatherings with Friends and Family: Twice a week    Attends Religious Services: More than 4 times per year    Active Member of [employer] or Organizations: No    Attends Engineer, Structural: Not on file    Marital Status: Married  Catering Manager Violence: Not on file    Review of Systems  Constitutional:  Negative for fatigue and unexpected weight change.  Respiratory:  Negative for chest tightness and shortness of breath.   Cardiovascular:  Negative for chest pain, palpitations and leg swelling.  Gastrointestinal:  Negative for abdominal pain and blood in stool.  Neurological:  Negative for dizziness, syncope, light-headedness and headaches.  Objective:   Vitals:   05/12/23 1353  BP: 118/74  Pulse: 77  Temp: 98.1 F (36.7 C)  TempSrc: Temporal  SpO2: 99%  Weight: 278 lb 6.4 oz (126.3 kg)  Height: 5' 6.5 (1.689 m)     Physical Exam Vitals reviewed. Chaperone present: mackenzie for abdominal/groin exam.  Constitutional:      Appearance: Normal appearance. She is well-developed.  HENT:     Head: Normocephalic and atraumatic.  Eyes:     Conjunctiva/sclera: Conjunctivae normal.     Pupils: Pupils are equal, round, and reactive to light.  Neck:     Vascular: No carotid bruit.  Cardiovascular:     Rate and Rhythm: Normal rate and regular rhythm.     Heart sounds: Normal heart sounds.  Pulmonary:     Effort: Pulmonary effort is normal.     Breath sounds: Normal breath sounds.  Abdominal:     Palpations: Abdomen is soft. There is no pulsatile mass.     Tenderness: There is no abdominal tenderness.     Hernia: No hernia is present.    Musculoskeletal:     Right lower leg: No edema.     Left lower leg: No edema.     Comments: Right foot, no bony tenderness.  Right ankle, malleoli nontender, no focal bony tenderness.  Slight discomfort  of retrocalcaneal bursa with lateral squeeze, negative heel squeeze, most tender over the distal Achilles at insertion without defect or soft tissue swelling appreciated.  Negative Thompson's.  Intact plantarflexion without significant discomfort.  Left foot, no bony tenderness.   Left ankle malleoli nontender, no focal bony tenderness.  Retrocalcaneal bursa nontender.  Discomfort at the insertion of the Achilles without defect or soft tissue swelling, negative Thompson's.  Intact plantarflexion without significant discomfort.  Skin:    General: Skin is warm and dry.  Neurological:     Mental Status: She is alert and oriented to person, place, and time.  Psychiatric:        Mood and Affect: Mood normal.        Behavior: Behavior normal.   Negative seated straight leg raise.  Ambulating without assistive device.     Assessment & Plan:  Emi Lymon is a 28 y.o. female . Lymphadenopathy, inguinal - Plan: doxycycline  (VIBRA -TABS) 100 MG tablet  -Lesion in question appears to be reactive lymph node.  She does have a history of hidradenitis but I do not see any active lesions at present and denies any recent active lesions.  However that could be primary cause.  Denies new sexual contacts or STI risk factors, unlikely LGV.  Denies any vaginal discharge, lesions.  Intermittent radicular symptoms could be from inflammation in area but reassuring exam.  -Given timing of area we will try treating for infection initially with doxycycline  given other antibiotic allergies.  If not improved within the next 10 to 14 days, would recommend imaging of area and possible biopsy.  RTC precautions if new or worsening symptoms sooner or any systemic symptoms.  History of hypertension  -Stable now off antihypertensives.  Continue to monitor.  No new meds for now.  Achilles tendinitis of both lower extremities - Plan: Ambulatory referral to Sports Medicine  -Possible injury 2 years ago to R achilles, but no sign  of rupture.  Suspected Achilles tendinosis bilaterally, right greater than left with left tendinosis possible compensatory from persistent right sided issues.  Temporary heel lift discussed, handout given, eccentric exercise may be helpful but given chronicity of  right right side will refer to one of my sports medicine colleagues that may evaluate with MSK ultrasound, discuss other treatment options.  Question option of topical nitroglycerin for right sided symptoms given timing.  Defer further management to other sports medicine provider.  Needs flu shot - Plan: Flu vaccine trivalent PF, 6mos and older(Flulaval,Afluria,Fluarix,Fluzone)   Meds ordered this encounter  Medications   doxycycline  (VIBRA -TABS) 100 MG tablet    Sig: Take 1 tablet (100 mg total) by mouth 2 (two) times daily.    Dispense:  20 tablet    Refill:  0   Patient Instructions  Ankle/heel pain is likely Achilles tendinitis/tendinosis.  See information below.  Gentle stretching is fine, try a heel insert that can provide a little bit of lift to take some of the pressure off that Achilles area for now.  I will refer you to one of my sports medicine colleagues that may be able to ultrasound that area and talk about other treatment options.   Bump in the right groin appears to be a possible reactive lymph node.  I could have been from a skin infection or the hidradenitis.  Based on it being present for 3 weeks I think treated with antibiotic would be reasonable.  If that does not improve in the next 2 weeks, let me know and we can get you evaluated with imaging and possible biopsy of that area but I suspect the antibiotic will take care of it.  If any worsening symptoms be seen sooner.  Continue to monitor your blood pressure and if readings above 140/90, let me know.  Return to the clinic or go to the nearest emergency room if any of your symptoms worsen or new symptoms occur.  Achilles Tendinitis  Achilles tendinitis is  inflammation of the tough, cord-like band that connects the lower leg muscles to the heel bone (Achilles tendon). This is often caused by using the tendon and ankle joint too much. In most cases, Achilles tendinitis gets better over time with treatment and home care. It can take weeks or months to fully heal. What are the causes? This condition may be caused by: A sudden increase in exercise or activity, such as running. Doing the same exercises or activities, such as jumping, over and over. Not warming up your calf muscles before you exercise. Exercising in shoes that are worn out or not made for exercise. Having arthritis or a bone growth (spur) on the back of your heel. This can rub against the tendon and hurt it. Age-related wear and tear. Tendons become less flexible with age and are more likely to be injured. What are the signs or symptoms? Common symptoms of this condition include: Pain in your Achilles tendon or in the back of your leg, just above your heel. The pain may get worse when you exercise. Stiffness or soreness in the back of your leg. You may feel it most often in the morning. Swelling of the skin over the Achilles tendon. Thickening of the tendon. Trouble standing on tiptoe. How is this diagnosed? This condition is diagnosed based on your symptoms and a physical exam. You may also have tests, such as: X-rays. MRI. Ultrasound. How is this treated? The goal of treatment is to relieve symptoms and help your injury heal. You may need to: Decrease or stop activities that caused the tendinitis. You may be told to switch to low-impact exercises like biking or swimming. Ice the injured area. Do physical therapy. This may include strengthening  and stretching exercises. Take NSAIDs, such as ibuprofen . These can help with pain and swelling. Use supportive shoes, wraps, heel lifts, or a walking boot (air cast). Have surgery. This may be done if your symptoms do not get better  with other treatments. Use high-energy waves to start the healing process (extracorporeal shock wave therapy). This is rare. Get an injection of medicines that help with inflammation (corticosteroids). This is rare. Follow these instructions at home: If you have an air cast: Wear the air cast as told by your health care provider. Remove it only as told by your provider. Check the skin around the air cast every day. Tell your provider about any concerns. Loosen the air cast if your toes tingle, become numb, or turn cold and blue. Keep the air cast clean. If the air cast is not waterproof: Do not let it get wet. Cover it with a watertight covering when you take a bath or shower. Managing pain, stiffness, and swelling  If told, put ice on the injured area. If you have a removable air cast, remove it as told by your provider. Put ice in a plastic bag. Place a towel between your skin and the bag. Leave the ice on for 20 minutes, 2-3 times a day. If your skin turns bright red, remove the ice right away to prevent skin damage. The risk of damage is higher if you cannot feel pain, heat, or cold. Move your toes often to reduce stiffness and swelling. Raise (elevate) your foot above the level of your heart while you are sitting or lying down. Activity Do not do activities that cause pain. Ask your provider when it is safe to drive if you have an air cast on your foot. If you go to physical therapy, do exercises as told by your provider or therapist. Return to your normal activities as told by your provider. Ask your provider what activities are safe for you. General instructions Take over-the-counter and prescription medicines only as told by your provider. If told, wrap your foot with an elastic bandage or other wrap. This can help to keep your tendon from moving too much while it heals. Your provider will show you how to wrap your foot. Wear supportive shoes or heel lifts only as told by your  provider. Contact a health care provider if: Your symptoms get worse. Your pain does not get better with medicine. You have new symptoms that you cannot explain. You have warmth and swelling in your foot. You have a fever. Get help right away if: You hear a sudden popping sound in your Achilles tendon and then have severe pain. You cannot move your toes or foot. You cannot put any weight on your foot. Your foot or toes become numb and look white or blue even after you loosen your bandage or air cast. This information is not intended to replace advice given to you by your health care provider. Make sure you discuss any questions you have with your health care provider. Document Revised: 01/15/2022 Document Reviewed: 01/15/2022 Elsevier Patient Education  2024 Elsevier Inc.   Lymphadenopathy  Lymphadenopathy is when your lymph glands are swollen or larger than normal.  Lymph glands, also called lymph nodes, are clumps of tissue. They filter germs and waste from tissues in your body to your bloodstream. They're part of your body's defense system, or immune system. Lymphadenopathy has different causes, like infection, autoimmune disease, and cancer. Lymphadenopathy can happen wherever you have lymph nodes. The type you  have depends on which nodes it's in, such as: Cervical lymphadenopathy. This is in the neck. Mediastinal lymphadenopathy. This is in the chest. Hilar lymphadenopathy. This is in the lungs. Axillary lymphadenopathy. This is in the armpits. Inguinal lymphadenopathy. This is in the groin. Sometimes, fluid and cells that fight infection build up in your lymph nodes. This happens when your immune system reacts to germs or other substances that get into your body. This makes lymph nodes swell and get bigger. Treatment is based on what's thought to be the cause. Sometimes, lymph nodes don't go back to normal size after treatment. If yours don't, your health care provider may order  tests to help learn why your glands are still swollen and big. Follow these instructions at home:  Take over-the-counter and prescription medicines only as told by your provider. If you were prescribed antibiotics, do not stop using them, even if you start to feel better. If told, apply heat to swollen lymph nodes as told by your provider. Use the heat source that your provider recommends, such as a moist heat pack or a heating pad. Place a towel between your skin and the heat source. Leave the heat on only for the time told by your provider to avoid injury. If your skin turns bright red, remove the heat right away to prevent burns. The risk of burns is higher if you cannot feel pain, heat, or cold. Check your swollen lymph nodes every day for changes. Check other places where you have lymph nodes as told. Check for changes such as: More swelling. Sudden growth in size. Redness or pain. Hardness. Contact a health care provider if: You have lymph nodes that: Are still swollen after 2 weeks. Have gotten bigger all of a sudden or the swelling spreads. Are red, painful, or hard. Fluid leaks from the skin near a swollen lymph node. You get a fever, chills, or night sweats. You feel tired. You have a sore throat. Your abdomen hurts. You lose weight without trying. This information is not intended to replace advice given to you by your health care provider. Make sure you discuss any questions you have with your health care provider. Document Revised: 07/21/2022 Document Reviewed: 07/21/2022 Elsevier Patient Education  2024 Elsevier Inc.     Signed,   Reyes Pines, MD [facility], [facility] 05/12/23 3:34 PM

## 2023-05-12 NOTE — Patient Instructions (Signed)
 Ankle/heel pain is likely Achilles tendinitis/tendinosis.  See information below.  Gentle stretching is fine, try a heel insert that can provide a little bit of lift to take some of the pressure off that Achilles area for now.  I will refer you to one of my sports medicine colleagues that may be able to ultrasound that area and talk about other treatment options.   Bump in the right groin appears to be a possible reactive lymph node.  I could have been from a skin infection or the hidradenitis.  Based on it being present for 3 weeks I think treated with antibiotic would be reasonable.  If that does not improve in the next 2 weeks, let me know and we can get you evaluated with imaging and possible biopsy of that area but I suspect the antibiotic will take care of it.  If any worsening symptoms be seen sooner.  Continue to monitor your blood pressure and if readings above 140/90, let me know.  Return to the clinic or go to the nearest emergency room if any of your symptoms worsen or new symptoms occur.  Achilles Tendinitis  Achilles tendinitis is inflammation of the tough, cord-like band that connects the lower leg muscles to the heel bone (Achilles tendon). This is often caused by using the tendon and ankle joint too much. In most cases, Achilles tendinitis gets better over time with treatment and home care. It can take weeks or months to fully heal. What are the causes? This condition may be caused by: A sudden increase in exercise or activity, such as running. Doing the same exercises or activities, such as jumping, over and over. Not warming up your calf muscles before you exercise. Exercising in shoes that are worn out or not made for exercise. Having arthritis or a bone growth (spur) on the back of your heel. This can rub against the tendon and hurt it. Age-related wear and tear. Tendons become less flexible with age and are more likely to be injured. What are the signs or symptoms? Common  symptoms of this condition include: Pain in your Achilles tendon or in the back of your leg, just above your heel. The pain may get worse when you exercise. Stiffness or soreness in the back of your leg. You may feel it most often in the morning. Swelling of the skin over the Achilles tendon. Thickening of the tendon. Trouble standing on tiptoe. How is this diagnosed? This condition is diagnosed based on your symptoms and a physical exam. You may also have tests, such as: X-rays. MRI. Ultrasound. How is this treated? The goal of treatment is to relieve symptoms and help your injury heal. You may need to: Decrease or stop activities that caused the tendinitis. You may be told to switch to low-impact exercises like biking or swimming. Ice the injured area. Do physical therapy. This may include strengthening and stretching exercises. Take NSAIDs, such as ibuprofen . These can help with pain and swelling. Use supportive shoes, wraps, heel lifts, or a walking boot (air cast). Have surgery. This may be done if your symptoms do not get better with other treatments. Use high-energy waves to start the healing process (extracorporeal shock wave therapy). This is rare. Get an injection of medicines that help with inflammation (corticosteroids). This is rare. Follow these instructions at home: If you have an air cast: Wear the air cast as told by your health care provider. Remove it only as told by your provider. Check the skin around  the air cast every day. Tell your provider about any concerns. Loosen the air cast if your toes tingle, become numb, or turn cold and blue. Keep the air cast clean. If the air cast is not waterproof: Do not let it get wet. Cover it with a watertight covering when you take a bath or shower. Managing pain, stiffness, and swelling  If told, put ice on the injured area. If you have a removable air cast, remove it as told by your provider. Put ice in a plastic  bag. Place a towel between your skin and the bag. Leave the ice on for 20 minutes, 2-3 times a day. If your skin turns bright red, remove the ice right away to prevent skin damage. The risk of damage is higher if you cannot feel pain, heat, or cold. Move your toes often to reduce stiffness and swelling. Raise (elevate) your foot above the level of your heart while you are sitting or lying down. Activity Do not do activities that cause pain. Ask your provider when it is safe to drive if you have an air cast on your foot. If you go to physical therapy, do exercises as told by your provider or therapist. Return to your normal activities as told by your provider. Ask your provider what activities are safe for you. General instructions Take over-the-counter and prescription medicines only as told by your provider. If told, wrap your foot with an elastic bandage or other wrap. This can help to keep your tendon from moving too much while it heals. Your provider will show you how to wrap your foot. Wear supportive shoes or heel lifts only as told by your provider. Contact a health care provider if: Your symptoms get worse. Your pain does not get better with medicine. You have new symptoms that you cannot explain. You have warmth and swelling in your foot. You have a fever. Get help right away if: You hear a sudden popping sound in your Achilles tendon and then have severe pain. You cannot move your toes or foot. You cannot put any weight on your foot. Your foot or toes become numb and look white or blue even after you loosen your bandage or air cast. This information is not intended to replace advice given to you by your health care provider. Make sure you discuss any questions you have with your health care provider. Document Revised: 01/15/2022 Document Reviewed: 01/15/2022 Elsevier Patient Education  2024 [employer]   Lymphadenopathy  Lymphadenopathy is when your lymph glands are  swollen or larger than normal.  Lymph glands, also called lymph nodes, are clumps of tissue. They filter germs and waste from tissues in your body to your bloodstream. They're part of your body's defense system, or immune system. Lymphadenopathy has different causes, like infection, autoimmune disease, and cancer. Lymphadenopathy can happen wherever you have lymph nodes. The type you have depends on which nodes it's in, such as: Cervical lymphadenopathy. This is in the neck. Mediastinal lymphadenopathy. This is in the chest. Hilar lymphadenopathy. This is in the lungs. Axillary lymphadenopathy. This is in the armpits. Inguinal lymphadenopathy. This is in the groin. Sometimes, fluid and cells that fight infection build up in your lymph nodes. This happens when your immune system reacts to germs or other substances that get into your body. This makes lymph nodes swell and get bigger. Treatment is based on what's thought to be the cause. Sometimes, lymph nodes don't go back to normal size after treatment. If yours  don't, your health care provider may order tests to help learn why your glands are still swollen and big. Follow these instructions at home:  Take over-the-counter and prescription medicines only as told by your provider. If you were prescribed antibiotics, do not stop using them, even if you start to feel better. If told, apply heat to swollen lymph nodes as told by your provider. Use the heat source that your provider recommends, such as a moist heat pack or a heating pad. Place a towel between your skin and the heat source. Leave the heat on only for the time told by your provider to avoid injury. If your skin turns bright red, remove the heat right away to prevent burns. The risk of burns is higher if you cannot feel pain, heat, or cold. Check your swollen lymph nodes every day for changes. Check other places where you have lymph nodes as told. Check for changes such as: More  swelling. Sudden growth in size. Redness or pain. Hardness. Contact a health care provider if: You have lymph nodes that: Are still swollen after 2 weeks. Have gotten bigger all of a sudden or the swelling spreads. Are red, painful, or hard. Fluid leaks from the skin near a swollen lymph node. You get a fever, chills, or night sweats. You feel tired. You have a sore throat. Your abdomen hurts. You lose weight without trying. This information is not intended to replace advice given to you by your health care provider. Make sure you discuss any questions you have with your health care provider. Document Revised: 07/21/2022 Document Reviewed: 07/21/2022 Elsevier Patient Education  2024 [employer].

## 2023-05-18 NOTE — Progress Notes (Signed)
 [facility] [address] [place] [address] Phone: [phone]   Assessment and Plan:     1. Heel pain, bilateral 2. Achilles tendonitis, bilateral  -Chronic with exacerbation, initial sports medicine visit - Most consistent with bilateral Achilles tendinitis with 2 years of right heel pain and 8 months of left heel pain - Start meloxicam  15 mg daily x2 weeks.  If still having pain after 2 weeks, complete 3rd-week of NSAID. May use remaining NSAID as needed once daily for pain control.  Do not to use additional over-the-counter NSAIDs (ibuprofen , naproxen , Advil , Aleve ) while taking prescription NSAIDs.  May use Tylenol  [phone] mg 2 to 3 times a day for breakthrough pain. - Start HEP for Achilles tendinitis  Sports Medicine: Musculoskeletal Ultrasound. Exam: Limited US  of bilateral Achilles tendons Diagnosis: Bilateral heel pain  US  Findings: Right side showed small posterior heel spur and cortical density at Achilles insertion with areas of hypoechoic stranding consistent with inflammation.  Left side showed areas of hyperechoic change at insertion of Achilles tendon consistent with Achilles tendinitis  US  Impression:  Bilateral Achilles tendinitis  15 additional minutes spent for educating Therapeutic Home Exercise Program.  This included exercises focusing on stretching, strengthening, with focus on eccentric aspects.   Long term goals include an improvement in range of motion, strength, endurance as well as avoiding reinjury. Patient's frequency would include in 1-2 times a day, 3-5 times a week for a duration of 6-12 weeks. Proper technique shown and discussed handout in great detail with ATC.  All questions were discussed and answered.  Pertinent previous records reviewed include none  Follow Up: 4 weeks for reevaluation.  If no improvement or worsening of symptoms, would obtain bilateral ankle x-ray and could consider  advanced imaging versus physical therapy   Subjective:   I, Alexandra Collins, am serving as a neurosurgeon for Doctor Morene Mace  Chief Complaint: bilat achilles pain   HPI:   05/19/2023 Patient is a 28 year old female with bilat achilles pain. Patient states she had an injury 2 years ago she was doing step ups and felt a stretching sensation R heel. Then a few months ago the left heel had the same sensation at gym. No meds for the pain . No radiating pain. No numbness or tingling. The pain is a stabbing sensation. Pain is worse at the end of the day. R is worse than L   Relevant Historical Information: None pertinent  Additional pertinent review of systems negative.   Current Outpatient Medications:    clindamycin  (CLINDAGEL) 1 % gel, Apply topically 2 (two) times daily., Disp: 30 g, Rfl: 1   doxycycline  (VIBRA -TABS) 100 MG tablet, Take 1 tablet (100 mg total) by mouth 2 (two) times daily., Disp: 20 tablet, Rfl: 0   ibuprofen  (ADVIL ) 600 MG tablet, Take 1 tablet (600 mg total) by mouth every 6 (six) hours., Disp: 30 tablet, Rfl: 0   meloxicam  (MOBIC ) 15 MG tablet, Take 1 tablet (15 mg total) by mouth daily., Disp: 30 tablet, Rfl: 0   NIFEdipine  (ADALAT  CC) 30 MG 24 hr tablet, Take 1 tablet (30 mg total) by mouth daily., Disp: 30 tablet, Rfl: 0   ondansetron  (ZOFRAN -ODT) 4 MG disintegrating tablet, 4mg  ODT q4 hours prn nausea/vomit, Disp: 30 tablet, Rfl: 0   Prenat MV-Min w/Fe-Folate-DHA (PRENATAL COMPLETE PO), Take by mouth., Disp: , Rfl:    Objective:     Vitals:   05/19/23 1509  BP: 126/80  Pulse: 69  SpO2: 100%  Weight: 278 lb (126.1 kg)  Height: 5' 6 (1.676 m)      Body mass index is 44.87 kg/m.    Physical Exam:    Gen: Appears well, nad, nontoxic and pleasant Psych: Alert and oriented, appropriate mood and affect Neuro: sensation intact, strength is 5/5 with df/pf/inv/ev, muscle tone wnl Skin: no susupicious lesions or rashes  Bilateral foot/ankle:  No deformity,  no swelling or effusion TTP distal gastrocnemius muscle bilaterally, Achilles tendon bilaterally, posterior calcaneus bilaterally Posterior heel pain with heel raise bilaterally NTTP over fibular head, lat mal, medial mal,  , navicular, base of 5th, ATFL, CFL, deltoid,   or midfoot ROM DF 30, PF 45, inv/ev intact Negative ant drawer, talar tilt, rotation test, squeeze test. Neg thompson No pain with resisted inversion or eversion    Electronically signed by:  Odis Mace [facility] 3:37 PM 05/19/23

## 2023-05-19 ENCOUNTER — Ambulatory Visit (INDEPENDENT_AMBULATORY_CARE_PROVIDER_SITE_OTHER): Payer: Medicaid Other | Admitting: Sports Medicine

## 2023-05-19 ENCOUNTER — Other Ambulatory Visit: Payer: Self-pay

## 2023-05-19 VITALS — BP 126/80 | HR 69 | Ht 66.0 in | Wt 278.0 lb

## 2023-05-19 DIAGNOSIS — M7662 Achilles tendinitis, left leg: Secondary | ICD-10-CM | POA: Diagnosis not present

## 2023-05-19 DIAGNOSIS — M79671 Pain in right foot: Secondary | ICD-10-CM

## 2023-05-19 DIAGNOSIS — M79672 Pain in left foot: Secondary | ICD-10-CM

## 2023-05-19 DIAGNOSIS — M7661 Achilles tendinitis, right leg: Secondary | ICD-10-CM | POA: Diagnosis not present

## 2023-05-19 MED ORDER — MELOXICAM 15 MG PO TABS: 15.0000 mg | ORAL_TABLET | Freq: Every day | ORAL | 0 refills | Status: DC

## 2023-05-19 NOTE — Patient Instructions (Signed)
-   Start meloxicam  15 mg daily x2 weeks.  If still having pain after 2 weeks, complete 3rd-week of NSAID. May use remaining NSAID as needed once daily for pain control.  Do not to use additional over-the-counter NSAIDs (ibuprofen , naproxen , Advil , Aleve ) while taking prescription NSAIDs.  May use Tylenol  [phone] mg 2 to 3 times a day for breakthrough pain. Achilles HEP  4 week follow up

## 2023-05-30 ENCOUNTER — Ambulatory Visit: Payer: BC Managed Care – PPO | Admitting: Dermatology

## 2023-06-11 DIAGNOSIS — Z419 Encounter for procedure for purposes other than remedying health state, unspecified: Secondary | ICD-10-CM | POA: Diagnosis not present

## 2023-06-15 ENCOUNTER — Other Ambulatory Visit: Payer: Self-pay | Admitting: Sports Medicine

## 2023-06-15 NOTE — Progress Notes (Signed)
 [facility]andra Collins D.[facility] [address] [place] [address] Phone: [phone]   Assessment and Plan:     1. Heel pain, bilateral (Primary) 2. Achilles tendonitis, bilateral -Chronic with exacerbation, subsequent visit - Still consistent with bilateral Achilles tendinitis - Continue meloxicam  15 mg daily.  Recommend setting a reminder or putting medication next to toothbrush as a reminder to take medication every day for 2 to 3 weeks - Continue HEP and start physical therapy.  Referral sent - Recommend wearing supportive footwear.  Can go to [employer] or other similar store for recommendations - Recommend low impact exercise such as stationary bike, elliptical, water aerobics - Will obtain x-rays at today's visit and can review at follow-up visit    Pertinent previous records reviewed include none  Follow Up: 4 for reevaluation.  Would review ankle x-rays   Subjective:   I, Alexandra Collins, am serving as a neurosurgeon for Doctor [employer]  Chief Complaint: bilateral Achilles pain.  HPI:  05/19/2023 Patient is a 28 year old female with bilat achilles pain. Patient states she had an injury 2 years ago she was doing step ups and felt a stretching sensation R heel. Then a few months ago the left heel had the same sensation at gym. No meds for the pain . No radiating pain. No numbness or tingling. The pain is a stabbing sensation. Pain is worse at the end of the day. R is worse than L.   06/16/2023 Patient states still in pain. Hasn't been taking meloxicam  regularly. Would like a PT referral    Relevant Historical Information: None pertinent  Additional pertinent review of systems negative.   Current Outpatient Medications:    clindamycin  (CLINDAGEL) 1 % gel, Apply topically 2 (two) times daily., Disp: 30 g, Rfl: 1   doxycycline  (VIBRA -TABS) 100 MG tablet, Take 1 tablet (100 mg total) by mouth 2 (two) times daily., Disp: 20 tablet, Rfl:  0   ibuprofen  (ADVIL ) 600 MG tablet, Take 1 tablet (600 mg total) by mouth every 6 (six) hours., Disp: 30 tablet, Rfl: 0   meloxicam  (MOBIC ) 15 MG tablet, Take 1 tablet (15 mg total) by mouth daily., Disp: 30 tablet, Rfl: 0   NIFEdipine  (ADALAT  CC) 30 MG 24 hr tablet, Take 1 tablet (30 mg total) by mouth daily., Disp: 30 tablet, Rfl: 0   ondansetron  (ZOFRAN -ODT) 4 MG disintegrating tablet, 4mg  ODT q4 hours prn nausea/vomit, Disp: 30 tablet, Rfl: 0   Prenat MV-Min w/Fe-Folate-DHA (PRENATAL COMPLETE PO), Take by mouth., Disp: , Rfl:    Objective:     Vitals:   06/16/23 1608  BP: 110/80  Pulse: 69  SpO2: 100%  Weight: 276 lb (125.2 kg)  Height: 5' 6 (1.676 m)      Body mass index is 44.55 kg/m.    Physical Exam:    Gen: Appears well, nad, nontoxic and pleasant Psych: Alert and oriented, appropriate mood and affect Neuro: sensation intact, strength is 5/5 with df/pf/inv/ev, muscle tone wnl Skin: no susupicious lesions or rashes   Bilateral foot/ankle:  No deformity, no swelling or effusion TTP distal gastrocnemius muscle bilaterally, Achilles tendon bilaterally, posterior calcaneus bilaterally Posterior heel pain with heel raise bilaterally NTTP over fibular head, lat mal, medial mal,  , navicular, base of 5th, ATFL, CFL, deltoid,   or midfoot ROM DF 30, PF 45, inv/ev intact Negative ant drawer, talar tilt, rotation test, squeeze test. Neg thompson No pain with resisted inversion or eversion  Electronically signed by:  Odis Mace [facility] 4:20 PM 06/16/23

## 2023-06-16 ENCOUNTER — Ambulatory Visit (INDEPENDENT_AMBULATORY_CARE_PROVIDER_SITE_OTHER): Payer: Medicaid Other

## 2023-06-16 ENCOUNTER — Ambulatory Visit (INDEPENDENT_AMBULATORY_CARE_PROVIDER_SITE_OTHER): Payer: Medicaid Other | Admitting: Sports Medicine

## 2023-06-16 VITALS — BP 110/80 | HR 69 | Ht 66.0 in | Wt 276.0 lb

## 2023-06-16 DIAGNOSIS — M79671 Pain in right foot: Secondary | ICD-10-CM

## 2023-06-16 DIAGNOSIS — M7662 Achilles tendinitis, left leg: Secondary | ICD-10-CM | POA: Diagnosis not present

## 2023-06-16 DIAGNOSIS — M79672 Pain in left foot: Secondary | ICD-10-CM

## 2023-06-16 DIAGNOSIS — M7661 Achilles tendinitis, right leg: Secondary | ICD-10-CM

## 2023-06-16 NOTE — Patient Instructions (Addendum)
 PT referral   Continue meloxicam  recommend setting a reminder or placing by your toothbrush so you take it everyday  Recommend going to [employer] for foot wear or inserts  Recommend stationary bike, elliptical, and water aerobics  Xrays on the way out  4 week follow up

## 2023-06-17 ENCOUNTER — Encounter: Payer: Self-pay | Admitting: Sports Medicine

## 2023-06-20 ENCOUNTER — Other Ambulatory Visit: Payer: Self-pay | Admitting: Sports Medicine

## 2023-06-20 DIAGNOSIS — M79672 Pain in left foot: Secondary | ICD-10-CM

## 2023-06-20 DIAGNOSIS — M7661 Achilles tendinitis, right leg: Secondary | ICD-10-CM

## 2023-06-28 ENCOUNTER — Other Ambulatory Visit: Payer: Self-pay | Admitting: Sports Medicine

## 2023-06-28 DIAGNOSIS — M7661 Achilles tendinitis, right leg: Secondary | ICD-10-CM

## 2023-06-28 DIAGNOSIS — M79671 Pain in right foot: Secondary | ICD-10-CM

## 2023-06-28 NOTE — Progress Notes (Signed)
 New referral placed.

## 2023-07-09 DIAGNOSIS — Z419 Encounter for procedure for purposes other than remedying health state, unspecified: Secondary | ICD-10-CM | POA: Diagnosis not present

## 2023-07-20 NOTE — Progress Notes (Deleted)
    [facility] D.[facility] [address] [place] [address] Phone: [phone]   Assessment and Plan:     There are no diagnoses linked to this encounter.  ***   Pertinent previous records reviewed include ***    Follow Up: ***     Subjective:   I, [name], am serving as a Neurosurgeon for Doctor [employer]   Chief Complaint: bilateral Achilles pain.   HPI:  05/19/2023 Patient is a 28 year old female with bilat achilles pain. Patient states she had an injury 2 years ago she was doing step ups and felt a stretching sensation R heel. Then a few months ago the left heel had the same sensation at gym. No meds for the pain . No radiating pain. No numbness or tingling. The pain is a stabbing sensation. Pain is worse at the end of the day. R is worse than L.    06/16/2023 Patient states still in pain. Hasn't been taking meloxicam regularly. Would like a PT referral    07/21/2023 Patient states   Relevant Historical Information: None pertinent  Additional pertinent review of systems negative.   Current Outpatient Medications:    clindamycin (CLINDAGEL) 1 % gel, Apply topically 2 (two) times daily., Disp: 30 g, Rfl: 1   doxycycline (VIBRA-TABS) 100 MG tablet, Take 1 tablet (100 mg total) by mouth 2 (two) times daily., Disp: 20 tablet, Rfl: 0   ibuprofen (ADVIL) 600 MG tablet, Take 1 tablet (600 mg total) by mouth every 6 (six) hours., Disp: 30 tablet, Rfl: 0   meloxicam (MOBIC) 15 MG tablet, Take 1 tablet (15 mg total) by mouth daily., Disp: 30 tablet, Rfl: 0   NIFEdipine (ADALAT CC) 30 MG 24 hr tablet, Take 1 tablet (30 mg total) by mouth daily., Disp: 30 tablet, Rfl: 0   ondansetron (ZOFRAN-ODT) 4 MG disintegrating tablet, 4mg  ODT q4 hours prn nausea/vomit, Disp: 30 tablet, Rfl: 0   Prenat MV-Min w/Fe-Folate-DHA (PRENATAL COMPLETE PO), Take by mouth., Disp: , Rfl:    Objective:     There were no vitals filed for this visit.     There is no height or weight on file to calculate BMI.    Physical Exam:    ***   Electronically signed by:  [facility]andra [facility] 7:59 AM 07/20/23

## 2023-07-21 ENCOUNTER — Ambulatory Visit: Payer: Medicaid Other | Admitting: Sports Medicine

## 2023-07-28 ENCOUNTER — Other Ambulatory Visit: Payer: Self-pay

## 2023-07-28 ENCOUNTER — Ambulatory Visit (HOSPITAL_COMMUNITY): Payer: 59 | Attending: Emergency Medicine | Admitting: Physical Therapy

## 2023-07-28 DIAGNOSIS — M79672 Pain in left foot: Secondary | ICD-10-CM | POA: Insufficient documentation

## 2023-07-28 DIAGNOSIS — M7662 Achilles tendinitis, left leg: Secondary | ICD-10-CM | POA: Insufficient documentation

## 2023-07-28 DIAGNOSIS — M7661 Achilles tendinitis, right leg: Secondary | ICD-10-CM | POA: Diagnosis not present

## 2023-07-28 DIAGNOSIS — M6281 Muscle weakness (generalized): Secondary | ICD-10-CM | POA: Diagnosis present

## 2023-07-28 DIAGNOSIS — M79671 Pain in right foot: Secondary | ICD-10-CM | POA: Diagnosis present

## 2023-07-28 NOTE — Therapy (Signed)
 OUTPATIENT PHYSICAL THERAPY LOWER EXTREMITY EVALUATION   Patient Name: Alexandra Collins MRN: [id number] DOB:February 20, 1996, 28 y.o., female Today's Date: 07/28/2023  END OF SESSION:  PT End of Session - 07/28/23 1647     Visit Number 1    Number of Visits 8    Date for PT Re-Evaluation 08/27/23    Authorization Type [employer]    Progress Note Due on Visit 8    PT Start Time 1600    PT Stop Time 1640    PT Time Calculation (min) 40 min    Activity Tolerance Patient tolerated treatment well    Behavior During Therapy [facility] for tasks assessed/performed             Past Medical History:  Diagnosis Date   Anemia    Chronic hypertension 06/22/2022   Hydradenitis    Pregnancy induced hypertension    Spinal headache    Past Surgical History:  Procedure Laterality Date   KNEE ARTHROSCOPY Right    Patient Active Problem List   Diagnosis Date Noted   BMI 40.0-44.9, adult (HCC) 07/07/2022   Chronic hypertension in pregnancy 06/24/2022   History of pre-eclampsia 08/24/2019   Acne 02/26/2013   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 Then a few months ago the left heel had the same sensation at gym. Pain is worse at the end of the day. Rt is worse than Lt but the LT pain is increasing. She states that if she is active that day at the end of the day her feet are in pain and she will wake up in pain the next morning but then as she walks the pain  begins to subside until the end of the day when it starts to increase again.  PT states after she has walked about 5000 steps she notes the pain is increasing   She has not worn any brace at night.  She is not icing.  Marland Kitchen      PERTINENT HISTORY:n/a PAIN:  Are you having pain? Yes: NPRS scale: 6/10 worst is an 8-9 can wake her up at night.   Pain location: heels  Pain description: sharp stabbing burning  Aggravating factors: activity  Relieving factors: not sure   PRECAUTIONS: None  RED FLAGS: None   WEIGHT BEARING RESTRICTIONS: No  FALLS:  Has patient fallen in last 6 months? No  LIVING ENVIRONMENT: Lives with: lives with their family Lives in: House/apartment Stairs: Yes: Internal: 2 steps; on right going up has to hold onto the rail; step to not reciprocal  Has following equipment at home: None  OCCUPATION: teacher   PLOF: Independent working out in the gym  PATIENT GOALS: less pain/ better activity tolerance   NEXT MD VISIT: 3/25  OBJECTIVE:  Note: Objective measures were completed at Evaluation unless otherwise noted.  EDEMA:  Mild   PALPATION: Tender with noted mm spasms in B gastroc   LOWER EXTREMITY ROM:  Active ROM Right eval Left eval  Hip flexion    Hip extension    Hip abduction  Hip adduction    Hip internal rotation    Hip external rotation    Knee flexion    Knee extension    Ankle dorsiflexion 7 5  Ankle plantarflexion    Ankle inversion    Ankle eversion     (Blank rows = not tested)  LOWER EXTREMITY MMT:  MMT Right eval Left eval  Hip flexion    Hip extension    Hip abduction    Hip adduction    Hip internal rotation    Hip external rotation    Knee flexion    Knee extension    Ankle dorsiflexion 4+ 4+   Ankle plantarflexion 4 4  Ankle inversion 4- 4-  Ankle eversion 4- 4-   (Blank rows = not tested)    FUNCTIONAL TESTS:  30 seconds chair stand test: 14; 22 poor  2 minute walk test: 580 ft decreased heel  strike.   Single leg stance:  30" B therapist stopped as pt was not having difficulty                                                                                                                                  TREATMENT DATE: 07/28/23 Evaluation   Seated plantar fascia stretch x 1 30" B Gastroc stretch x 1 30" B Plantar fascia stretch x 1 30"B   PATIENT EDUCATION:  Education details: HEP , roll feet over a 1 liter bottle of frozen water, possible use of a night splint.   Person educated: Patient Education method: Explanation and Handouts Education comprehension: verbalized understanding  HOME EXERCISE PROGRAM: Access Code: [email or web address] URL: [email or web address] Date: 07/28/2023 Prepared by: Virgina Organ  Exercises - Seated Plantar Fascia Stretch  - 2 x daily - 7 x weekly - 1 sets - 3 reps - 30" hold - Gastroc Stretch on Wall  - 2 x daily - 7 x weekly - 1 sets - 3 reps - 30" hold - Plantar Fascia Stretch on Step  - 2 x daily - 7 x weekly - 1 sets - 3 reps - 30" hold ASSESSMENT:  CLINICAL IMPRESSION: Patient is a 28 y.o. female who was seen today for physical therapy evaluation and treatment for B heel pain.  Pt evaluation demonstrates increased pain, decreased ROM, decreased strength,and  decreased activity tolerance .  Ms. Byers will benefit from skilled PT to address these  deficits and maximize her functional ability. .   OBJECTIVE IMPAIRMENTS: decreased activity tolerance, , decreased ROM, decreased strength, increased fascial restrictions, and pain.   ACTIVITY LIMITATIONS: locomotion level  PARTICIPATION LIMITATIONS: community activity  REHAB POTENTIAL: Good  CLINICAL DECISION MAKING: Evolving/moderate complexity  EVALUATION COMPLEXITY: Moderate   GOALS: Goals reviewed with patient? No  SHORT TERM GOALS: Target date: 08/11/23 Pt to be I in HEP in order to decrease her pain to no greater than a 5/10 to allow increased activity tolerance   Baseline: Goal status: INITIAL  2.  Pt has not been woken from her sleep in the past week due to heel pain.  Baseline:  Goal status: INITIAL  3.  PT ankle strength to be improved 1/2 grade to allow pt to walk 7500 steps prior to having pain  Baseline:  Goal status: INITIAL   LONG TERM GOALS: Target date: 08/25/23  Pt to be I in an advanced HEP in order to decrease her pain to no greater than a 3/10 Baseline:  Goal status: INITIAL  2.  PT ankle strength to be improved 1 grade to allow pt to walk 1000 steps prior to having pain Baseline:  Goal status: INITIAL  3.  PT to be able to come sit to stand 20x in 30" Baseline:  Goal status: INITIAL   PLAN:  PT FREQUENCY: 2x/week  PT DURATION: 6 weeks  PLANNED INTERVENTIONS: 97110-Therapeutic exercises, 97530- Therapeutic activity, 97112- Neuromuscular re-education, 97535- Self Care, 13244- Manual therapy, and Dry Needling  PLAN FOR NEXT SESSION: continue manual, stretching and strengthening possible assess for dry needling   Virgina Organ, PT CLT [phone]  07/28/2023, 4:48 PM   Managed Medicaid Authorization Request  Visit Dx Codes: M79.671,M79.672 (ICD-10-CM) - Heel pain, bilateral M76.61,M76.62 (ICD-10-CM) - Achilles tendonitis, bilateral  Functional Tool Score: sit to stand;  pt able to complete 14 in 30" ;  22 is poor for pt age  For all possible CPT codes, reference the Planned Interventions line above.     Check all conditions that are expected to impact treatment: {Conditions expected to impact treatment:None of these apply   If treatment provided at initial evaluation, no treatment charged due to lack of authorization.

## 2023-08-01 NOTE — Progress Notes (Deleted)
    Aleen Sells D.[facility] [address] [place] [address] Phone: [phone]   Assessment and Plan:     There are no diagnoses linked to this encounter.  ***   Pertinent previous records reviewed include ***    Follow Up: ***     Subjective:   I, Alexandra Collins, am serving as a Neurosurgeon for Doctor [employer]   Chief Complaint: bilateral Achilles pain.   HPI:  05/19/2023 Patient is a 28 year old female with bilat achilles pain. Patient states she had an injury 2 years ago she was doing step ups and felt a stretching sensation R heel. Then a few months ago the left heel had the same sensation at gym. No meds for the pain . No radiating pain. No numbness or tingling. The pain is a stabbing sensation. Pain is worse at the end of the day. R is worse than L.    06/16/2023 Patient states still in pain. Hasn't been taking meloxicam regularly. Would like a PT referral    08/02/2023 Patient states   Relevant Historical Information: None pertinent  Additional pertinent review of systems negative.   Current Outpatient Medications:    clindamycin (CLINDAGEL) 1 % gel, Apply topically 2 (two) times daily., Disp: 30 g, Rfl: 1   doxycycline (VIBRA-TABS) 100 MG tablet, Take 1 tablet (100 mg total) by mouth 2 (two) times daily., Disp: 20 tablet, Rfl: 0   ibuprofen (ADVIL) 600 MG tablet, Take 1 tablet (600 mg total) by mouth every 6 (six) hours., Disp: 30 tablet, Rfl: 0   meloxicam (MOBIC) 15 MG tablet, Take 1 tablet (15 mg total) by mouth daily., Disp: 30 tablet, Rfl: 0   NIFEdipine (ADALAT CC) 30 MG 24 hr tablet, Take 1 tablet (30 mg total) by mouth daily., Disp: 30 tablet, Rfl: 0   ondansetron (ZOFRAN-ODT) 4 MG disintegrating tablet, 4mg  ODT q4 hours prn nausea/vomit, Disp: 30 tablet, Rfl: 0   Prenat MV-Min w/Fe-Folate-DHA (PRENATAL COMPLETE PO), Take by mouth., Disp: , Rfl:    Objective:     There were no vitals filed for this visit.     There is no height or weight on file to calculate BMI.    Physical Exam:    ***   Electronically signed by:  Aleen Sells D.[facility] 12:41 PM 08/01/23

## 2023-08-02 ENCOUNTER — Ambulatory Visit: Admitting: Sports Medicine

## 2023-08-03 ENCOUNTER — Encounter: Payer: Self-pay | Admitting: Sports Medicine

## 2023-08-03 ENCOUNTER — Telehealth: Payer: Self-pay | Admitting: Sports Medicine

## 2023-08-03 NOTE — Telephone Encounter (Signed)
error 

## 2023-08-08 ENCOUNTER — Ambulatory Visit (HOSPITAL_COMMUNITY)

## 2023-08-08 DIAGNOSIS — M6281 Muscle weakness (generalized): Secondary | ICD-10-CM

## 2023-08-08 DIAGNOSIS — M79671 Pain in right foot: Secondary | ICD-10-CM | POA: Diagnosis not present

## 2023-08-08 DIAGNOSIS — M79672 Pain in left foot: Secondary | ICD-10-CM

## 2023-08-08 NOTE — Therapy (Signed)
 OUTPATIENT PHYSICAL THERAPY LOWER EXTREMITY EVALUATION   Patient Name: Alexandra Collins MRN: [id number] DOB:April 01, 1996, 28 y.o., female Today's Date: 08/08/2023  END OF SESSION:  PT End of Session - 08/08/23 1603     Visit Number 2    Number of Visits 8    Date for PT Re-Evaluation 08/27/23    Authorization Type [employer]    Progress Note Due on Visit 8    PT Start Time 1603    PT Stop Time 1642    PT Time Calculation (min) 39 min    Activity Tolerance Patient tolerated treatment well    Behavior During Therapy [facility] for tasks assessed/performed             Past Medical History:  Diagnosis Date   Anemia    Chronic hypertension 06/22/2022   Hydradenitis    Pregnancy induced hypertension    Spinal headache    Past Surgical History:  Procedure Laterality Date   KNEE ARTHROSCOPY Right    Patient Active Problem List   Diagnosis Date Noted   BMI 40.0-44.9, adult (HCC) 07/07/2022   Chronic hypertension in pregnancy 06/24/2022   History of pre-eclampsia 08/24/2019   Acne 02/26/2013   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 But today better overall because she hasn't walked much today. Gym has been okay. She does mostly strength training. she doesn't do cardio until the end. R>L for pain. Had MCL/ACL surgery in 2017. Still hasn't tried Ice but will and plans to order brace  Patient states she had an injury 2 years ago she was doing step ups and felt  a stretching sensation R heel. Then a few months ago the left heel had the same sensation at gym. Pain is worse at the end of the day. Rt is worse than Lt but the LT pain is increasing. She states that if she is active that day at the end of the day her feet are in pain and she will wake up in pain the next morning but then as she walks the pain begins to subside until the end of the day when it starts to increase again. PT states after she has walked about 5000 steps she notes the pain is increasing  She has not worn any brace at night.  She is not icing.  Marland Kitchen      PERTINENT HISTORY:n/a PAIN:  Are you having pain? Yes: NPRS scale: 6/10 worst is an 8-9 can wake her up at night.   Pain location: heels  Pain description: sharp stabbing burning  Aggravating factors: activity  Relieving factors: not sure   PRECAUTIONS: None  RED FLAGS: None   WEIGHT BEARING RESTRICTIONS: No  FALLS:  Has patient fallen in last 6 months? No  LIVING ENVIRONMENT: Lives with: lives with their family Lives in: House/apartment Stairs: Yes: Internal: 2 steps; on right going up has to hold onto the rail; step to not reciprocal  Has following equipment at home: None  OCCUPATION: teacher   PLOF: Independent working out in the gym  PATIENT GOALS: less  pain/ better activity tolerance   NEXT MD VISIT: 3/25  OBJECTIVE:  Note: Objective measures were completed at Evaluation unless otherwise noted.  EDEMA:  Mild   PALPATION: Tender with noted mm spasms in B gastroc   LOWER EXTREMITY ROM:  Active ROM Right eval Left eval  Hip flexion    Hip extension    Hip abduction    Hip adduction    Hip internal rotation    Hip external rotation    Knee flexion    Knee extension    Ankle dorsiflexion 7 5  Ankle plantarflexion    Ankle inversion    Ankle eversion     (Blank rows = not tested)  LOWER EXTREMITY MMT:  MMT Right eval Left eval  Hip flexion    Hip extension    Hip abduction    Hip  adduction    Hip internal rotation    Hip external rotation    Knee flexion    Knee extension    Ankle dorsiflexion 4+ 4+   Ankle plantarflexion 4 4  Ankle inversion 4- 4-  Ankle eversion 4- 4-   (Blank rows = not tested)    FUNCTIONAL TESTS:  30 seconds chair stand test: 14; 22 poor  2 minute walk test: 580 ft decreased heel strike.   Single leg stance:  30" B therapist stopped as pt was not having difficulty                                                                                                                                  TREATMENT DATE:  08/08/23: Review HEP and goals Seated plantar fascia stretch 2x30" B Gastroc stretch 2x30" B Plantar fascia stretch 2x30"B  Seated Plantar Fascia Mobs with Tennis ball, bilat, 2x1' Standing Heel raises in // bars, 3x10 Standing toe raises in // bars, 3x10  07/28/23 Evaluation   Seated plantar fascia stretch x 1 30" B Gastroc stretch x 1 30" B Plantar fascia stretch x 1 30"B   PATIENT EDUCATION:  Education details: HEP , roll feet over a 1 liter bottle of frozen water, possible use of a night splint.   Person educated: Patient Education method: Explanation and Handouts Education comprehension: verbalized understanding  HOME EXERCISE PROGRAM: Access Code: [email or web address] URL: [email or web address] Date: 08/08/2023 Prepared by: [name]  Exercises - Seated Plantar Fascia Stretch  - 2 x daily - 7 x weekly - 1 sets - 3 reps - 30" hold - Gastroc Stretch on Wall  - 2 x daily - 7 x weekly - 1 sets - 3 reps - 30" hold - Plantar Fascia Stretch on Step  - 2 x daily - 7 x weekly - 1 sets - 3 reps - 30" hold - Seated Plantar Fascia Mobilization with Small Ball  - 2 x daily - 7 x weekly - 2 sets - 1 hold  ASSESSMENT:  CLINICAL IMPRESSION: Patient tolerates session well. Patient with  good carryover of HEP, requiring little cueing to form to lessen stretch as to avoid excessive pain. Addition of Seated  Plantar Fascia Mobs with Tennis ball to HEP, with patient reporting positive results at improving sore spots. Patient performs heel and toe raises in parallel bars, requiring verbal cueing to limit range of motion to avoid excessive pain. Patient continues to demonstrate increased pain, decreased ROM, decreased strength,and  decreased activity tolerance which she will benefit from skilled physical therapy to address.  Patient is a 28 y.o. female who was seen today for physical therapy evaluation and treatment for B heel pain.  Pt evaluation demonstrates increased pain, decreased ROM, decreased strength,and  decreased activity tolerance .  Alexandra Collins will benefit from skilled PT to address these  deficits and maximize her functional ability. .   OBJECTIVE IMPAIRMENTS: decreased activity tolerance, , decreased ROM, decreased strength, increased fascial restrictions, and pain.   ACTIVITY LIMITATIONS: locomotion level  PARTICIPATION LIMITATIONS: community activity  REHAB POTENTIAL: Good  CLINICAL DECISION MAKING: Evolving/moderate complexity  EVALUATION COMPLEXITY: Moderate   GOALS: Goals reviewed with patient? No  SHORT TERM GOALS: Target date: 08/11/23 Pt to be I in HEP in order to decrease her pain to no greater than a 5/10 to allow increased activity tolerance  Baseline: Goal status: INITIAL  2.  Pt has not been woken from her sleep in the past week due to heel pain.  Baseline:  Goal status: INITIAL  3.  PT ankle strength to be improved 1/2 grade to allow pt to walk 7500 steps prior to having pain  Baseline:  Goal status: INITIAL   LONG TERM GOALS: Target date: 08/25/23  Pt to be I in an advanced HEP in order to decrease her pain to no greater than a 3/10 Baseline:  Goal status: INITIAL  2.  PT ankle strength to be improved 1 grade to allow pt to walk 1000 steps prior to having pain Baseline:  Goal status: INITIAL  3.  PT to be able to come sit to stand 20x in 30" Baseline:   Goal status: INITIAL   PLAN:  PT FREQUENCY: 2x/week  PT DURATION: 6 weeks  PLANNED INTERVENTIONS: 97110-Therapeutic exercises, 97530- Therapeutic activity, 97112- Neuromuscular re-education, 97535- Self Care, 60454- Manual therapy, and Dry Needling  PLAN FOR NEXT SESSION: continue manual, stretching and strengthening possible assess for dry needling      4:53 PM, 08/08/23 Alexandra Collins, PT, DPT [facility]

## 2023-08-10 ENCOUNTER — Ambulatory Visit (HOSPITAL_COMMUNITY): Attending: Emergency Medicine

## 2023-08-10 ENCOUNTER — Encounter (HOSPITAL_COMMUNITY): Payer: Self-pay

## 2023-08-10 DIAGNOSIS — M79672 Pain in left foot: Secondary | ICD-10-CM | POA: Insufficient documentation

## 2023-08-10 DIAGNOSIS — M79671 Pain in right foot: Secondary | ICD-10-CM | POA: Insufficient documentation

## 2023-08-10 DIAGNOSIS — M6281 Muscle weakness (generalized): Secondary | ICD-10-CM | POA: Insufficient documentation

## 2023-08-10 NOTE — Therapy (Addendum)
 OUTPATIENT PHYSICAL THERAPY LOWER EXTREMITY EVALUATION   Patient Name: Alexandra Collins MRN: [id number] DOB:11-28-1995, 28 y.o., female Today's Date: 08/10/2023  END OF SESSION:  PT End of Session - 08/10/23 1602     Visit Number 3    Number of Visits 8    Date for PT Re-Evaluation 08/27/23    Authorization Type [employer]    PT Start Time 1602    PT Stop Time 1643    PT Time Calculation (min) 41 min    Activity Tolerance Patient tolerated treatment well;Patient limited by pain    Behavior During Therapy [facility] for tasks assessed/performed             Past Medical History:  Diagnosis Date   Anemia    Chronic hypertension 06/22/2022   Hydradenitis    Pregnancy induced hypertension    Spinal headache    Past Surgical History:  Procedure Laterality Date   KNEE ARTHROSCOPY Right    Patient Active Problem List   Diagnosis Date Noted   BMI 40.0-44.9, adult (HCC) 07/07/2022   Chronic hypertension in pregnancy 06/24/2022   History of pre-eclampsia 08/24/2019   Acne 02/26/2013   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 Didn't try any new exercises after last session 2/2 pain. Reports a static type of feeling in R foot and up shin right now. Reports today was a full day of work today.    Patient states she had an injury 2 years ago she was doing step ups and felt a stretching sensation R heel. Then a few months ago the left heel had the same sensation at gym. Pain is worse at the end of the day. Rt  is worse than Lt but the LT pain is increasing. She states that if she is active that day at the end of the day her feet are in pain and she will wake up in pain the next morning but then as she walks the pain begins to subside until the end of the day when it starts to increase again. PT states after she has walked about 5000 steps she notes the pain is increasing  She has not worn any brace at night.  She is not icing.  Marland Kitchen      PERTINENT HISTORY:n/a PAIN:  Are you having pain? Yes: NPRS scale: 6/10 worst is an 8-9 can wake her up at night.   Pain location: heels  Pain description: sharp stabbing burning  Aggravating factors: activity  Relieving factors: not sure   PRECAUTIONS: None  RED FLAGS: None   WEIGHT BEARING RESTRICTIONS: No  FALLS:  Has patient fallen in last 6 months? No  LIVING ENVIRONMENT: Lives with: lives with their family Lives in: House/apartment Stairs: Yes: Internal: 2 steps; on right going up has to hold onto the rail; step to not reciprocal  Has following equipment at home: None  OCCUPATION: teacher   PLOF: Independent working out in the gym  PATIENT GOALS: less pain/ better activity tolerance, be able to sit criss-crossed without pain  NEXT MD VISIT: 3/25  OBJECTIVE:  Note: Objective measures were completed at Evaluation  unless otherwise noted.  EDEMA:  Mild   PALPATION: Tender with noted mm spasms in B gastroc   LOWER EXTREMITY ROM:  Active ROM Right eval Left eval  Hip flexion    Hip extension    Hip abduction    Hip adduction    Hip internal rotation    Hip external rotation    Knee flexion    Knee extension    Ankle dorsiflexion 7 5  Ankle plantarflexion    Ankle inversion    Ankle eversion     (Blank rows = not tested)  LOWER EXTREMITY MMT:  MMT Right eval Left eval  Hip flexion    Hip extension    Hip abduction    Hip adduction    Hip internal rotation    Hip external rotation    Knee flexion    Knee extension     Ankle dorsiflexion 4+ 4+   Ankle plantarflexion 4 4  Ankle inversion 4- 4-  Ankle eversion 4- 4-   (Blank rows = not tested)    FUNCTIONAL TESTS:  30 seconds chair stand test: 14; 22 poor  2 minute walk test: 580 ft decreased heel strike.   Single leg stance:  30" B therapist stopped as pt was not having difficulty                                                                                                                                  TREATMENT DATE:  08/10/23: Slant board calf stretch, 2x10 reps x10' hold, one foot a time [employer], 2x20 taps, forward/backward in // bars w/ UE support Attempt at standing Heel raises (too painful) Treadmill, 3', up to 1.3 mph Resisted DF/PF in long sitting, YTB, 2x10 Attempt at Ever/Inv (too painful) Ankle circles, 2'   08/08/23: Review HEP and goals Seated plantar fascia stretch 2x30" B Gastroc stretch 2x30" B Plantar fascia stretch 2x30"B  Seated Plantar Fascia Mobs with Tennis ball, bilat, 2x1' Standing Heel raises in // bars, 3x10 Standing toe raises in // bars, 3x10  07/28/23 Evaluation   Seated plantar fascia stretch x 1 30" B Gastroc stretch x 1 30" B Plantar fascia stretch x 1 30"B   PATIENT EDUCATION:  Education details: HEP , roll feet over a 1 liter bottle of frozen water, possible use of a night splint.   Person educated: Patient Education method: Explanation and Handouts Education comprehension: verbalized understanding  HOME EXERCISE PROGRAM: Access Code: [email or web address] URL: [email or web address] Date: 08/08/2023 Prepared by: [name]  Exercises - Seated Plantar Fascia Stretch  - 2 x daily - 7 x weekly - 1 sets - 3 reps - 30" hold - Gastroc Stretch on Wall  - 2 x daily - 7 x weekly - 1 sets - 3 reps - 30" hold - Plantar Fascia Stretch on Step  - 2 x daily - 7 x weekly - 1 sets - 3 reps - 30" hold -  Seated Plantar Fascia Mobilization with Small Ball  - 2 x daily - 7 x weekly - 2 sets -  1 hold  ASSESSMENT:  CLINICAL IMPRESSION: Patient reporting to PT with increased pain on this date in R achilles and into R calf since last visit. Reports increased standing and walking today and yesterday. With review of heel raises, patient unable to tolerate, reporting shooting pain in achilles. Patient reports increased pins and needles feeling when asked to perform hamstring curl on RLE. During resisted DF/PF and ankle circles, ice is donned. Patient reports improved pain during ambulation afterwards. Patient Education given on icing R achilles and heel tonight. Patient should be receiving nightly plantar fascitis brace soon. Education given on easing into full ROM. Patient continues to demonstrate increased pain, decreased ROM, decreased strength,and  decreased activity tolerance which she will benefit from skilled physical therapy to address.   Patient is a 28 y.o. female who was seen today for physical therapy evaluation and treatment for B heel pain.  Pt evaluation demonstrates increased pain, decreased ROM, decreased strength,and  decreased activity tolerance .  Ms. Ploch will benefit from skilled PT to address these  deficits and maximize her functional ability. .   OBJECTIVE IMPAIRMENTS: decreased activity tolerance, , decreased ROM, decreased strength, increased fascial restrictions, and pain.   ACTIVITY LIMITATIONS: locomotion level  PARTICIPATION LIMITATIONS: community activity  REHAB POTENTIAL: Good  CLINICAL DECISION MAKING: Evolving/moderate complexity  EVALUATION COMPLEXITY: Moderate   GOALS: Goals reviewed with patient? No  SHORT TERM GOALS: Target date: 08/11/23 Pt to be I in HEP in order to decrease her pain to no greater than a 5/10 to allow increased activity tolerance  Baseline: Goal status: INITIAL  2.  Pt has not been woken from her sleep in the past week due to heel pain.  Baseline:  Goal status: INITIAL  3.  PT ankle strength to be improved 1/2 grade to  allow pt to walk 7500 steps prior to having pain  Baseline:  Goal status: INITIAL   LONG TERM GOALS: Target date: 08/25/23  Pt to be I in an advanced HEP in order to decrease her pain to no greater than a 3/10 Baseline:  Goal status: INITIAL  2.  PT ankle strength to be improved 1 grade to allow pt to walk 1000 steps prior to having pain Baseline:  Goal status: INITIAL  3.  PT to be able to come sit to stand 20x in 30" Baseline:  Goal status: INITIAL   PLAN:  PT FREQUENCY: 2x/week  PT DURATION: 6 weeks  PLANNED INTERVENTIONS: 97110-Therapeutic exercises, 97530- Therapeutic activity, 97112- Neuromuscular re-education, 97535- Self Care, 29562- Manual therapy, and Dry Needling  PLAN FOR NEXT SESSION: continue manual, stretching and strengthening possible assess for dry needling, ice     5:02 PM, 08/10/23 Alexandra Collins, PT, DPT [facility] [place]

## 2023-08-15 ENCOUNTER — Ambulatory Visit (HOSPITAL_COMMUNITY)

## 2023-08-15 ENCOUNTER — Encounter (HOSPITAL_COMMUNITY): Payer: Self-pay

## 2023-08-15 NOTE — Therapy (Signed)
 [facility] Outpatient Rehabilitation at [facility] [address] [place], [place], [address] Phone: [phone]   Fax:  [phone]  Patient Details  Name: Alexandra Collins MRN: [id number] Date of Birth: 06-17-1995 Referring Provider:  No ref. provider found  Encounter Date: 08/15/2023  Pt was called regarding missed appointment and did not answer therefore voicemail was left regarding missed appointment and no show policy.  Placido Sou, PT 08/15/2023, 4:54 PM  [facility] Rehabilitation at [facility] [address] [place], [place], [address] Phone: [phone]   Fax:  [phone]

## 2023-08-17 ENCOUNTER — Encounter (HOSPITAL_COMMUNITY): Payer: Self-pay

## 2023-08-17 ENCOUNTER — Encounter (HOSPITAL_COMMUNITY)

## 2023-08-17 NOTE — Therapy (Signed)
 [facility] Outpatient Rehabilitation at [facility] [address] [place], [place], [address] Phone: [phone]   Fax:  [phone]  Patient Details  Name: Alexandra Collins MRN: [id number] Date of Birth: 07-08-1995 Referring Provider:  No ref. provider found  Encounter Date: 08/17/2023  Pt was called concerning her 4:00PM this afternoon. Pts feet are still bothering her but states that ice has been helping. Pt states she put the appointment times in her calendar on the wrong days. Pt plans to be here on the 15th for her next appointment, pt was instructed to keep up with her HEP.  Luz Lex, PT, DPT [facility] Office: [phone] 4:21 PM, 08/17/23   [facility] Out[facility] Rehabilitation at [facility] [address] [place], [place], [address] Phone: [phone]   Fax:  [phone]

## 2023-08-20 DIAGNOSIS — Z419 Encounter for procedure for purposes other than remedying health state, unspecified: Secondary | ICD-10-CM | POA: Diagnosis not present

## 2023-08-23 ENCOUNTER — Encounter (HOSPITAL_COMMUNITY)

## 2023-08-25 ENCOUNTER — Ambulatory Visit (HOSPITAL_COMMUNITY)

## 2023-08-25 ENCOUNTER — Encounter (HOSPITAL_COMMUNITY): Payer: Self-pay

## 2023-08-25 DIAGNOSIS — M79671 Pain in right foot: Secondary | ICD-10-CM | POA: Diagnosis not present

## 2023-08-25 DIAGNOSIS — M6281 Muscle weakness (generalized): Secondary | ICD-10-CM

## 2023-08-25 DIAGNOSIS — M79672 Pain in left foot: Secondary | ICD-10-CM

## 2023-08-25 NOTE — Therapy (Signed)
 OUTPATIENT PHYSICAL THERAPY LOWER EXTREMITY TREATMENT   Patient Name: Alexandra Collins MRN: [id number] DOB:[birth year], 28 y.o., female Today's Date: 08/25/2023  END OF SESSION:  PT End of Session - 08/25/23 1729     Visit Number 4    Number of Visits 8    Date for PT Re-Evaluation 08/27/23    Authorization Type [employer]    Progress Note Due on Visit 8    PT Start Time 1600    PT Stop Time 1643    PT Time Calculation (min) 43 min    Activity Tolerance Patient tolerated treatment well;Patient limited by pain    Behavior During Therapy [facility] for tasks assessed/performed              Past Medical History:  Diagnosis Date   Anemia    Chronic hypertension 06/22/2022   Hydradenitis    Pregnancy induced hypertension    Spinal headache    Past Surgical History:  Procedure Laterality Date   KNEE ARTHROSCOPY Right    Patient Active Problem List   Diagnosis Date Noted   BMI 40.0-44.9, adult (HCC) 07/07/2022   Chronic hypertension in pregnancy 06/24/2022   History of pre-eclampsia 08/24/2019   Acne 02/26/2013   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 Goes to gym 4-5 days a week. Pt reports plantar fascitis brace came in but is not sure it is helping because she is still waking up in the night with heel pain.   Patient states she had an injury 2 years ago she was doing step ups and felt a stretching sensation R heel. Then a few months ago the left heel had the same sensation at  gym. Pain is worse at the end of the day. Rt is worse than Lt but the LT pain is increasing. She states that if she is active that day at the end of the day her feet are in pain and she will wake up in pain the next morning but then as she walks the pain begins to subside until the end of the day when it starts to increase again. PT states after she has walked about 5000 steps she notes the pain is increasing  She has not worn any brace at night.  She is not icing.  Aaron Aas      PERTINENT HISTORY:n/a PAIN:  Are you having pain? Yes: NPRS scale: 6/10 worst is an 8-9 can wake her up at night.   Pain location: heels  Pain description: sharp stabbing burning  Aggravating factors: activity  Relieving factors: not sure   PRECAUTIONS: None  RED FLAGS: None   WEIGHT BEARING RESTRICTIONS: No  FALLS:  Has patient fallen in last 6 months? No  LIVING ENVIRONMENT: Lives with: lives with their family Lives in: House/apartment Stairs: Yes: Internal: 2 steps; on right going up has to hold onto the rail; step to not reciprocal  Has following equipment at home: None  OCCUPATION: teacher   PLOF: Independent working out in the gym  PATIENT GOALS: less pain/ better activity tolerance, be able to sit criss-crossed without pain  NEXT MD  VISIT: 3/25  OBJECTIVE:  Note: Objective measures were completed at Evaluation unless otherwise noted.  EDEMA:  Mild   PALPATION: Tender with noted mm spasms in B gastroc   LOWER EXTREMITY ROM:  Active ROM Right eval Left eval  Hip flexion    Hip extension    Hip abduction    Hip adduction    Hip internal rotation    Hip external rotation    Knee flexion    Knee extension    Ankle dorsiflexion 7 5  Ankle plantarflexion    Ankle inversion    Ankle eversion     (Blank rows = not tested)  LOWER EXTREMITY MMT:  MMT Right eval Left eval  Hip flexion    Hip extension    Hip abduction    Hip adduction    Hip internal rotation    Hip external  rotation    Knee flexion    Knee extension    Ankle dorsiflexion 4+ 4+   Ankle plantarflexion 4 4  Ankle inversion 4- 4-  Ankle eversion 4- 4-   (Blank rows = not tested)    FUNCTIONAL TESTS:  30 seconds chair stand test: 14; 22 poor  2 minute walk test: 580 ft decreased heel strike.   Single leg stance:  30" B therapist stopped as pt was not having difficulty                                                                                                                                  TREATMENT DATE:  08/25/2023  Therapeutic Exercise: -[employer]itter Board, 2x20 taps, forward/backward in // bars w/ UE support -Heel/toe and lateral aeromat walks, no UE needed, 2 laps in parallel bars -dead mill walk, 20 seconds, pt unable to due to RLE pain -Eccentric lowering from 2 inch step, 3 sets of 5 reps, pt cued for pain free ROM -TKE on 2 inch step, 2 sets of 10 reps bilaterally -[place] dead lift, 2 sets of 8 rep -Forward lunges on bosu ball, 1 set of 8 reps better performance going into RLE, pt cued for core activation and upright posture -Treadmill for 3 minutes, 2% incline, 1.5, pt cued for pain free velocity  08/10/23: Slant board calf stretch, 2x10 reps x10' hold, one foot a time [employer], 2x20 taps, forward/backward in // bars w/ UE support Attempt at standing Heel raises (too painful) Treadmill, 3', up to 1.3 mph Resisted DF/PF in long sitting, YTB, 2x10 Attempt at Ever/Inv (too painful) Ankle circles, 2'   08/08/23: Review HEP and goals Seated plantar fascia stretch 2x30" B Gastroc stretch 2x30" B Plantar fascia stretch 2x30"B  Seated Plantar Fascia Mobs with Tennis ball, bilat, 2x1' Standing Heel raises in // bars, 3x10 Standing toe raises in // bars, 3x10  07/28/23 Evaluation   Seated plantar fascia stretch x 1 30" B Gastroc stretch x 1 30" B Plantar fascia stretch x 1 30"B  PATIENT EDUCATION:  Education details: HEP , roll feet over a 1 liter bottle of frozen  water, possible use of a night splint.   Person educated: Patient Education method: Explanation and Handouts Education comprehension: verbalized understanding  HOME EXERCISE PROGRAM: Access Code: [email or web address] URL: [email or web address] Date: 08/08/2023 Prepared by: Fabiola Backer Powell-Butler  Exercises - Seated Plantar Fascia Stretch  - 2 x daily - 7 x weekly - 1 sets - 3 reps - 30" hold - Gastroc Stretch on Wall  - 2 x daily - 7 x weekly - 1 sets - 3 reps - 30" hold - Plantar Fascia Stretch on Step  - 2 x daily - 7 x weekly - 1 sets - 3 reps - 30" hold - Seated Plantar Fascia Mobilization with Small Ball  - 2 x daily - 7 x weekly - 2 sets - 1 hold  ASSESSMENT:  CLINICAL IMPRESSION: Patient continues to demonstrate increased pain primarily at RLE, decreased LE strength, decreased gait quality and balance. Patient also demonstrates decreased ability to complete loaded achilles tendon exercises but was able to tolerate eccentric bilateral lowering today. Patient able to initiate progress dynamic balance and core activation exercises today with aeromat walks. Patient would continue to benefit from skilled physical therapy for increased endurance with ambulation, increased LE strength, and improved balance for improved quality of life, improved independence with gait training and continued progress towards therapy goals.    Patient is a 28 y.o. female who was seen today for physical therapy evaluation and treatment for B heel pain.  Pt evaluation demonstrates increased pain, decreased ROM, decreased strength,and  decreased activity tolerance .  Ms. Hubner will benefit from skilled PT to address these  deficits and maximize her functional ability. .   OBJECTIVE IMPAIRMENTS: decreased activity tolerance, , decreased ROM, decreased strength, increased fascial restrictions, and pain.   ACTIVITY LIMITATIONS: locomotion level  PARTICIPATION LIMITATIONS: community activity  REHAB POTENTIAL:  Good  CLINICAL DECISION MAKING: Evolving/moderate complexity  EVALUATION COMPLEXITY: Moderate   GOALS: Goals reviewed with patient? No  SHORT TERM GOALS: Target date: 08/11/23 Pt to be I in HEP in order to decrease her pain to no greater than a 5/10 to allow increased activity tolerance  Baseline: Goal status: INITIAL  2.  Pt has not been woken from her sleep in the past week due to heel pain.  Baseline:  Goal status: INITIAL  3.  PT ankle strength to be improved 1/2 grade to allow pt to walk 7500 steps prior to having pain  Baseline:  Goal status: INITIAL   LONG TERM GOALS: Target date: 08/25/23  Pt to be I in an advanced HEP in order to decrease her pain to no greater than a 3/10 Baseline:  Goal status: INITIAL  2.  PT ankle strength to be improved 1 grade to allow pt to walk 1000 steps prior to having pain Baseline:  Goal status: INITIAL  3.  PT to be able to come sit to stand 20x in 30" Baseline:  Goal status: INITIAL   PLAN:  PT FREQUENCY: 2x/week  PT DURATION: 6 weeks  PLANNED INTERVENTIONS: 97110-Therapeutic exercises, 97530- Therapeutic activity, 97112- Neuromuscular re-education, 97535- Self Care, 54098- Manual therapy, and Dry Needling  PLAN FOR NEXT SESSION: continue manual, stretching and strengthening possible assess for dry needling, ice    Luz Lex, PT, DPT [facility] Office: [phone] 5:35 PM, 08/25/23

## 2023-08-29 ENCOUNTER — Ambulatory Visit (HOSPITAL_COMMUNITY)

## 2023-08-29 ENCOUNTER — Encounter (HOSPITAL_COMMUNITY): Payer: Self-pay

## 2023-08-29 DIAGNOSIS — M79671 Pain in right foot: Secondary | ICD-10-CM | POA: Diagnosis not present

## 2023-08-29 DIAGNOSIS — M79672 Pain in left foot: Secondary | ICD-10-CM

## 2023-08-29 DIAGNOSIS — M6281 Muscle weakness (generalized): Secondary | ICD-10-CM

## 2023-08-29 NOTE — Therapy (Signed)
 OUTPATIENT PHYSICAL THERAPY LOWER EXTREMITY TREATMENT   Patient Name: Alexandra Collins MRN: [id number] DOB:Jan 05, 1996, 28 y.o., female Today's Date: 08/29/2023  END OF SESSION:  PT End of Session - 08/29/23 1601     Visit Number 5    Number of Visits 8    Date for PT Re-Evaluation 08/27/23    Authorization Type [employer]    Progress Note Due on Visit 8    PT Start Time 1603    PT Stop Time 1641    PT Time Calculation (min) 38 min    Activity Tolerance Patient tolerated treatment well    Behavior During Therapy [facility] for tasks assessed/performed              Past Medical History:  Diagnosis Date   Anemia    Chronic hypertension 06/22/2022   Hydradenitis    Pregnancy induced hypertension    Spinal headache    Past Surgical History:  Procedure Laterality Date   KNEE ARTHROSCOPY Right    Patient Active Problem List   Diagnosis Date Noted   BMI 40.0-44.9, adult (HCC) 07/07/2022   Chronic hypertension in pregnancy 06/24/2022   History of pre-eclampsia 08/24/2019   Acne 02/26/2013   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 Has been on feet a little more today. Not much night pain anymore.   Patient states she had an injury 2 years ago she was doing step ups and felt a stretching sensation R heel. Then a few months ago the left heel had the same sensation at gym. Pain is worse at the end of the day. Rt is worse than Lt but the LT pain is increasing. She states that if she is active that day at the end of the day her  feet are in pain and she will wake up in pain the next morning but then as she walks the pain begins to subside until the end of the day when it starts to increase again. PT states after she has walked about 5000 steps she notes the pain is increasing  She has not worn any brace at night.  She is not icing.  Aaron Aas      PERTINENT HISTORY:n/a PAIN:  Are you having pain? Yes: NPRS scale: 6/10 worst is an 8-9 can wake her up at night.   Pain location: heels  Pain description: sharp stabbing burning  Aggravating factors: activity  Relieving factors: not sure   PRECAUTIONS: None  RED FLAGS: None   WEIGHT BEARING RESTRICTIONS: No  FALLS:  Has patient fallen in last 6 months? No  LIVING ENVIRONMENT: Lives with: lives with their family Lives in: House/apartment Stairs: Yes: Internal: 2 steps; on right going up has to hold onto the rail; step to not reciprocal  Has following equipment at home: None  OCCUPATION: teacher   PLOF: Independent working out in the gym  PATIENT GOALS: less pain/ better activity tolerance, be able to sit criss-crossed without pain  NEXT MD VISIT: 3/25  OBJECTIVE:  Note: Objective measures were completed at Evaluation unless otherwise noted.  EDEMA:  Mild   PALPATION: Tender with noted mm spasms in B gastroc  LOWER EXTREMITY ROM:  Active ROM Right eval Left eval Right  08/29/23 Left  08/29/23  Hip flexion      Hip extension      Hip abduction      Hip adduction      Hip internal rotation      Hip external rotation      Knee flexion      Knee extension      Ankle dorsiflexion 7 5 18 15   Ankle plantarflexion      Ankle inversion      Ankle eversion       (Blank rows = not tested)  LOWER EXTREMITY MMT:  MMT Right eval Left eval Right  08/29/23 Left  08/29/23  Hip flexion      Hip extension      Hip abduction      Hip adduction      Hip internal rotation      Hip external rotation      Knee flexion      Knee extension      Ankle  dorsiflexion 4+ 4+ 4+ 4+   Ankle plantarflexion 4 4 4+ 4+  Ankle inversion 4- 4- 4 4  Ankle eversion 4- 4- 4 4   (Blank rows = not tested)    FUNCTIONAL TESTS:  30 seconds chair stand test: 14; 22 poor  2 minute walk test: 580 ft decreased heel strike.   Single leg stance: 30" B therapist stopped as pt was not having difficulty    08/29/23:  30x STS: 22 STS, no pain 2 minute walk test: 600 ft, pain on R heel starts at 1 min 24 sec                                                                                                                                TREATMENT DATE:  08/29/23: PN/Re-eval: MMT ROM  30 sec STS Fitter Board, x20 taps forward/backward Side to side, one foot at a time Ankle Circles Squats feet on foam, 2x10  08/25/2023  Therapeutic Exercise: -Fitter Board, 2x20 taps, forward/backward in // bars w/ UE support -Heel/toe and lateral aeromat walks, no UE needed, 2 laps in parallel bars -dead mill walk, 20 seconds, pt unable to due to RLE pain -Eccentric lowering from 2 inch step, 3 sets of 5 reps, pt cued for pain free ROM -TKE on 2 inch step, 2 sets of 10 reps bilaterally -[place] dead lift, 2 sets of 8 rep -Forward lunges on bosu ball, 1 set of 8 reps better performance going into RLE, pt cued for core activation and upright posture -Treadmill for 3 minutes, 2% incline, 1.5, pt cued for pain free velocity  08/10/23: Slant board calf stretch, 2x10 reps x10' hold, one foot a time [employer], 2x20 taps, forward/backward in // bars w/ UE support Attempt at standing Heel raises (too painful) Treadmill, 3', up to 1.3 mph Resisted DF/PF in long  sitting, YTB, 2x10 Attempt at Ever/Inv (too painful) Ankle circles, 2'    PATIENT EDUCATION:  Education details: HEP , roll feet over a 1 liter bottle of frozen water, possible use of a night splint.   Person educated: Patient Education method: Explanation and Handouts Education comprehension: verbalized  understanding  HOME EXERCISE PROGRAM: Access Code: [email or web address] URL: [email or web address] Date: 08/08/2023 Prepared by: Virgia Griffins Powell-Butler  Exercises - Seated Plantar Fascia Stretch  - 2 x daily - 7 x weekly - 1 sets - 3 reps - 30" hold - Gastroc Stretch on Wall  - 2 x daily - 7 x weekly - 1 sets - 3 reps - 30" hold - Plantar Fascia Stretch on Step  - 2 x daily - 7 x weekly - 1 sets - 3 reps - 30" hold - Seated Plantar Fascia Mobilization with Small Ball  - 2 x daily - 7 x weekly - 2 sets - 1 hold  ASSESSMENT:  CLINICAL IMPRESSION: Patient tolerated session well on this date. Progress note/re-eval conducted with patient demonstrating improvements in ankle ROM, 30 second chair stand, and two minute walk tests as well as reports of decreased daily pain. Patient with one more appointment on Thursday where she will bring PF brace to practice donning. Anticipate discharge on 4/24. Patient would continue to benefit from skilled physical therapy for increased endurance with ambulation, increased LE strength, improve ankle ROM, and improved balance to improve QOL, function, and gait.    Patient is a 28 y.o. female who was seen today for physical therapy evaluation and treatment for B heel pain.  Pt evaluation demonstrates increased pain, decreased ROM, decreased strength,and  decreased activity tolerance .  Ms. Puga will benefit from skilled PT to address these  deficits and maximize her functional ability. .   OBJECTIVE IMPAIRMENTS: decreased activity tolerance, , decreased ROM, decreased strength, increased fascial restrictions, and pain.   ACTIVITY LIMITATIONS: locomotion level  PARTICIPATION LIMITATIONS: community activity  REHAB POTENTIAL: Good  CLINICAL DECISION MAKING: Evolving/moderate complexity  EVALUATION COMPLEXITY: Moderate   GOALS: Goals reviewed with patient? No  SHORT TERM GOALS: Target date: 08/11/23 Pt to be I in HEP in order to decrease her pain to no  greater than a 5/10 to allow increased activity tolerance  Baseline: Goal status: MET  2.  Pt has not been woken from her sleep in the past week due to heel pain.  Baseline:  Goal status: MET  3.  PT ankle strength to be improved 1/2 grade to allow pt to walk 7500 steps prior to having pain  Baseline:  Goal status: IN PROGRESS   LONG TERM GOALS: Target date: 08/25/23  Pt to be I in an advanced HEP in order to decrease her pain to no greater than a 3/10 Baseline:  Goal status: MET  2.  PT ankle strength to be improved 1 grade to allow pt to walk 1000 steps prior to having pain Baseline:  Goal status: IN PROGRESS  3.  PT to be able to come sit to stand 20x in 30" Baseline:  Goal status: MET   PLAN:  PT FREQUENCY: 2x/week  PT DURATION: 6 weeks  PLANNED INTERVENTIONS: 97110-Therapeutic exercises, 97530- Therapeutic activity, 97112- Neuromuscular re-education, 97535- Self Care, 30865- Manual therapy, and Dry Needling  PLAN FOR NEXT SESSION: continue manual, stretching and strengthening possible assess for dry needling, ice     5:59 PM, 08/29/23 Fonda Rochon Powell-Butler, PT, DPT [facility] [place]

## 2023-09-01 ENCOUNTER — Encounter (HOSPITAL_COMMUNITY)

## 2023-09-08 ENCOUNTER — Ambulatory Visit
Admission: EM | Admit: 2023-09-08 | Discharge: 2023-09-08 | Disposition: A | Discharge status: Home / Self Care | Attending: Nurse Practitioner | Admitting: Nurse Practitioner

## 2023-09-08 ENCOUNTER — Encounter: Payer: Self-pay | Admitting: Emergency Medicine

## 2023-09-08 ENCOUNTER — Ambulatory Visit

## 2023-09-08 ENCOUNTER — Other Ambulatory Visit: Payer: Self-pay

## 2023-09-08 DIAGNOSIS — R42 Dizziness and giddiness: Secondary | ICD-10-CM | POA: Diagnosis not present

## 2023-09-08 LAB — POCT URINALYSIS DIP (MANUAL ENTRY)
Bilirubin, UA: NEGATIVE
Glucose, UA: NEGATIVE mg/dL
Leukocytes, UA: NEGATIVE
Nitrite, UA: NEGATIVE
Protein Ur, POC: NEGATIVE mg/dL
Spec Grav, UA: >=1.03 — AB
Urobilinogen, UA: 0.2 U/dL
pH, UA: 5.5

## 2023-09-08 NOTE — ED Triage Notes (Signed)
 States she feels off balance x 3 days.  Also has a splinter in left hand x 2 days.

## 2023-09-08 NOTE — ED Provider Notes (Signed)
 [facility]    CSN: [id number] Arrival date & time: 09/08/23  1530      History   Chief Complaint No chief complaint on file.   HPI Alexandra Collins is a 28 y.o. female.   The history is provided by the patient.   Patient presents for a 3-day history of dizziness.  Patient states over the past several days, her symptoms have worsened.  She states that she has had a current episode of dizziness since 11 AM this morning.  She states that she feels like she is "off balance."  Patient with history of chronic hypertension, states that her blood pressure has been mostly controlled.  States that she is drinking plenty of fluids, states that she drinks approximately 3 bottles of water per day.  She states that she does not have any headache, blurred vision, chest pain, abdominal pain, nausea, vomiting, diarrhea, lower extremity weakness, numbness or tingling in her legs or feet.  Patient further denies recent upper respiratory symptoms, ear pain, or ear drainage.  She denies previous episodes of the same.  Per review of her chart, patient did have elevated blood pressure when she was pregnant and was experiencing dizziness at that time.  Patient states that she was put on medications for her blood pressure, and she has since stopped the medication since she delivered her child.  Of significance, patient states that her menstrual cycle started on the same day that her symptoms started.  She also reports prior history of anemia.  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 Authorizing Provider  clindamycin  (CLINDAGEL) 1 % gel Apply topically 2 (two) times daily. 08/25/22   Benjiman Bras, MD  cetirizine  (ZYRTEC  ALLERGY) 10 MG tablet Take 1 tablet (10 mg total) by mouth daily. Patient not taking: Reported on 03/12/2020 01/04/20 04/29/20  Hall-Potvin, [place], PA-C  fluticasone  (FLONASE ) 50 MCG/ACT nasal spray Place 1 spray into both nostrils daily. Patient not taking: Reported on 03/12/2020 01/04/20 04/29/20  Hall-Potvin, [place], PA-C    Family History Family History  Problem Relation Age of Onset   Hypertension Mother    Asthma Father    Seizures Father    Hypertension Father    Asthma Sister    Asthma Maternal Aunt    Diabetes Neg Hx    Heart disease Neg Hx    Stroke Neg Hx     Social History Social History   Tobacco Use   Smoking status:  Never   Smokeless tobacco: Never  Vaping Use   Vaping status: Never Used  Substance Use Topics   Alcohol use: No   Drug use: No     Allergies   Ceclor [cefaclor], Elemental sulfur, Garlic, Penicillins, Shellfish allergy, Sulfamethoxazole, Shrimp extract, and Augmentin [amoxicillin-pot clavulanate]   Review of Systems Review of Systems Per HPI  Physical Exam Triage Vital Signs ED Triage Vitals  Encounter Vitals Group     BP 09/08/23 1533 131/84     Systolic BP Percentile --      Diastolic BP Percentile --      Pulse Rate 09/08/23 1533 75     Resp 09/08/23 1533 18     Temp 09/08/23 1533 97.9 F (36.6 C)     Temp Source 09/08/23 1533 Oral     SpO2 09/08/23 1533 95 %     Weight --      Height --      Head Circumference --      Peak Flow --      Pain Score 09/08/23 1535 0     Pain Loc --       Pain Education --      Exclude from Growth Chart --    Orthostatic VS for the past 24 hrs:  BP- Lying Pulse- Lying BP- Sitting Pulse- Sitting BP- Standing at 0 minutes Pulse- Standing at 0 minutes  09/08/23 1540 123/77 70 120/84 67 119/87 83    Updated Vital Signs BP 131/84 (BP Location: Right Arm)   Pulse 75   Temp 97.9 F (36.6 C) (Oral)   Resp 18   LMP 09/05/2023 (Exact Date)   SpO2 95%   Visual Acuity Right Eye Distance:   Left Eye Distance:   Bilateral Distance:    Right Eye Near:   Left Eye Near:    Bilateral Near:     Physical Exam Vitals and nursing note reviewed.  Constitutional:      General: She is not in acute distress.    Appearance: Normal appearance.  HENT:     Head: Normocephalic.     Right Ear: Tympanic membrane, ear canal and external ear normal.     Left Ear: Tympanic membrane, ear canal and external ear normal.     Mouth/Throat:     Mouth: Mucous membranes are moist.  Eyes:     Extraocular Movements: Extraocular movements intact.     Conjunctiva/sclera: Conjunctivae normal.     Pupils: Pupils are equal, round, and reactive to light.  Cardiovascular:     Rate and Rhythm: Normal rate and regular rhythm.     Pulses: Normal pulses.     Heart sounds: Normal heart sounds.  Pulmonary:     Effort: Pulmonary effort is normal. No respiratory distress.     Breath sounds: Normal breath sounds. No stridor. No wheezing, rhonchi or rales.  Abdominal:     General: Bowel sounds are normal.     Palpations: Abdomen is soft.  Musculoskeletal:     Cervical back: Normal range of motion.  Lymphadenopathy:     Cervical: No cervical adenopathy.  Skin:    General: Skin is warm and dry.  Neurological:     General: No focal deficit present.     Mental Status: She is alert and oriented to person, place, and time.     GCS: GCS eye subscore is 4. GCS verbal subscore is 5. GCS motor subscore is 6.     Cranial Nerves: Cranial nerves 2-12 are  intact. No cranial  nerve deficit or facial asymmetry.     Sensory: Sensation is intact.     Motor: Motor function is intact.     Coordination: Coordination is intact.     Gait: Gait is intact.  Psychiatric:        Mood and Affect: Mood normal.        Behavior: Behavior normal.      UC Treatments / Results  Labs (all labs ordered are listed, but only abnormal results are displayed) Labs Reviewed - No data to display  EKG: NSR, no ectopy, No STEMI. Compared to EKGs dated 03/26/2022, 05/13/2020, and 12/24/2019.   Radiology No results found.  Procedures Procedures (including critical care time)  Medications Ordered in UC Medications - No data to display  Initial Impression / Assessment and Plan / UC Course  I have reviewed the triage vital signs and the nursing notes.  Pertinent labs & imaging results that were available during my care of the patient were reviewed by me and considered in my medical decision making (see chart for details).  On exam, lung sounds are clear throughout, room air sats at 95%.  Patient's neurological exam was within normal limits, no deficits noted.  EKG was also normal.  Urinalysis does show elevated specific gravity, consistent with dehydration.  CBC and BMP are pending for safety.  Difficult to determine the cause of the patient's dizziness.  Would like to see results of CBC to determine if patient's menstrual cycle are impacting the dizziness.  Supportive care recommendations were provided and discussed with the patient to include rest, increasing her fluids, vital signs are stable.  Supportive care recommendations were provided and discussed with the patient to include fluids, rest, over-the-counter analgesics, increasing her fluid intake, and following up with her PCP as recommended.  Patient advised that if symptoms fail to improve with treatment, do recommend that patient be seen by her PCP for further evaluation.    Final Clinical Impressions(s) / UC Diagnoses   Final  diagnoses:  None   Discharge Instructions   None    ED Prescriptions   None    PDMP not reviewed this encounter.   Hardy Lia, NP 09/08/23 [phone]

## 2023-09-08 NOTE — Discharge Instructions (Signed)
 CBC and BMP are pending.  Your EKG was normal today.  Based on your urinalysis, you do need to begin drinking more fluids.  Recommend you drink at 5-6 bottles of water daily to ensure you are well-hydrated. Avoid any sudden movements while symptoms persist. You may take over-the-counter Tylenol  or ibuprofen  as needed for pain or discomfort. If your pending test results are abnormal, you will be contacted.  You also have access to your results via MyChart. Recommend following up with your primary care physician within the next 7 to 10 days for reevaluation. Follow-up as needed.

## 2023-09-09 LAB — BASIC METABOLIC PANEL WITH GFR
BUN/Creatinine Ratio: 14 (ref 9–23)
BUN: 13 mg/dL (ref 6–20)
CO2: 19 mmol/L — ABNORMAL LOW (ref 20–29)
Calcium: 9.5 mg/dL (ref 8.7–10.2)
Chloride: 104 mmol/L (ref 96–106)
Creatinine, Ser: 0.91 mg/dL (ref 0.57–1.00)
Glucose: 85 mg/dL (ref 70–99)
Potassium: 4.4 mmol/L (ref 3.5–5.2)
Sodium: 140 mmol/L (ref 134–144)
eGFR: 89 mL/min/{1.73_m2} (ref >59)

## 2023-09-09 LAB — CBC WITH DIFFERENTIAL/PLATELET
Basophils Absolute: 0 10*3/uL (ref 0.0–0.2)
Basos: 0 %
EOS (ABSOLUTE): 0 10*3/uL (ref 0.0–0.4)
Eos: 1 %
Hematocrit: 36.9 % (ref 34.0–46.6)
Hemoglobin: 11.9 g/dL (ref 11.1–15.9)
Immature Grans (Abs): 0 10*3/uL (ref 0.0–0.1)
Immature Granulocytes: 0 %
Lymphocytes Absolute: 2.1 10*3/uL (ref 0.7–3.1)
Lymphs: 43 %
MCH: 29.8 pg (ref 26.6–33.0)
MCHC: 32.2 g/dL (ref 31.5–35.7)
MCV: 92 fL (ref 79–97)
Monocytes Absolute: 0.3 10*3/uL (ref 0.1–0.9)
Monocytes: 6 %
Neutrophils Absolute: 2.4 10*3/uL (ref 1.4–7.0)
Neutrophils: 50 %
Platelets: 409 10*3/uL (ref 150–450)
RBC: 4 x10E6/uL (ref 3.77–5.28)
RDW: 13 % (ref 11.7–15.4)
WBC: 4.9 10*3/uL (ref 3.4–10.8)

## 2023-09-19 DIAGNOSIS — Z419 Encounter for procedure for purposes other than remedying health state, unspecified: Secondary | ICD-10-CM | POA: Diagnosis not present

## 2023-10-06 ENCOUNTER — Encounter (HOSPITAL_COMMUNITY)

## 2023-10-20 DIAGNOSIS — Z419 Encounter for procedure for purposes other than remedying health state, unspecified: Secondary | ICD-10-CM | POA: Diagnosis not present

## 2023-11-07 ENCOUNTER — Ambulatory Visit
Admission: RE | Admit: 2023-11-07 | Discharge: 2023-11-07 | Disposition: A | Discharge status: Home / Self Care | Source: Ambulatory Visit | Attending: Nurse Practitioner | Admitting: Nurse Practitioner

## 2023-11-07 VITALS — BP 128/86 | HR 90 | Temp 99.5°F | Resp 20

## 2023-11-07 DIAGNOSIS — B349 Viral infection, unspecified: Secondary | ICD-10-CM

## 2023-11-07 DIAGNOSIS — R519 Headache, unspecified: Secondary | ICD-10-CM | POA: Diagnosis not present

## 2023-11-07 LAB — POC SARS CORONAVIRUS 2 AG -  ED: SARS Coronavirus 2 Ag: NEGATIVE

## 2023-11-07 NOTE — Discharge Instructions (Signed)
 Your COVID test was negative today. Continue over-the-counter analgesics such as Tylenol  or ibuprofen  as needed for pain, fever, or general discomfort. Apply cool compresses to the forehead and eyes to help with pain or discomfort. Make sure you are drinking plenty of fluids and getting plenty of rest.  Try to drink at least 8-10 8 ounce glasses of water daily while symptoms persist. Symptoms should improve over the next 5 to 7 days.  If symptoms fail to improve, or appear to be worsening, you may follow-up in this clinic or with your primary care physician for further evaluation. Follow-up as needed.

## 2023-11-07 NOTE — ED Provider Notes (Signed)
 [facility]    CSN: [id number] Arrival date & time: 11/07/23  1328      History   Chief Complaint Chief Complaint  Patient presents with   Headache    Body aches low grade fever and headache - pain behind the eyes - worsen with sunlight - Entered by patient    HPI Alexandra Collins is a 28 y.o. female.   The history is provided by the patient.   Patient presents for complaints of headache, body aches, and pain behind her eyes.  Symptoms started over the past 24 hours.  She reports that she has also had a low-grade temperature, which was noticed during triage today.  She denies fever, ear pain, nasal congestion, runny nose, cough, sore throat, abdominal pain, nausea, vomiting, diarrhea, or rash.  States that she has been taking Tylenol  for her symptoms with minimal relief.  She denies any obvious known sick contacts.  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 Authorizing Provider  clindamycin  (CLINDAGEL) 1 % gel Apply topically 2 (two) times daily. 08/25/22   Levora Reyes SAUNDERS, MD  cetirizine  (ZYRTEC  ALLERGY) 10 MG tablet Take 1 tablet (10 mg total) by mouth daily. Patient not taking: Reported on 03/12/2020 01/04/20 04/29/20  Hall-Potvin, [place], PA-C   fluticasone  (FLONASE ) 50 MCG/ACT nasal spray Place 1 spray into both nostrils daily. Patient not taking: Reported on 03/12/2020 01/04/20 04/29/20  Hall-Potvin, [place], PA-C    Family History Family History  Problem Relation Age of Onset   Hypertension Mother    Asthma Father    Seizures Father    Hypertension Father    Asthma Sister    Asthma Maternal Aunt    Diabetes Neg Hx    Heart disease Neg Hx    Stroke Neg Hx     Social History Social History   Tobacco Use   Smoking status: Never   Smokeless tobacco: Never  Vaping Use   Vaping status: Never Used  Substance Use Topics   Alcohol use: No   Drug use: No     Allergies   Ceclor [cefaclor], Elemental sulfur, Garlic, Penicillins, Shellfish allergy, Sulfamethoxazole, Shrimp extract, and Augmentin [amoxicillin-pot clavulanate]   Review of Systems Review of Systems Per HPI  Physical Exam Triage Vital Signs ED Triage Vitals  Encounter Vitals Group     BP 11/07/23 1343 128/86     Girls Systolic BP Percentile --      Girls Diastolic BP Percentile --  Boys Systolic BP Percentile --      Boys Diastolic BP Percentile --      Pulse Rate 11/07/23 1343 90     Resp 11/07/23 1343 20     Temp 11/07/23 1343 99.5 F (37.5 C)     Temp Source 11/07/23 1343 Oral     SpO2 11/07/23 1343 (!) 9 %     Weight --      Height --      Head Circumference --      Peak Flow --      Pain Score 11/07/23 1342 6     Pain Loc --      Pain Education --      Exclude from Growth Chart --    No data found.  Updated Vital Signs BP 128/86 (BP Location: Right Arm)   Pulse 90   Temp 99.5 F (37.5 C) (Oral)   Resp 20   LMP 10/25/2023 (Approximate)   SpO2 95%   Visual Acuity Right Eye Distance:   Left Eye Distance:   Bilateral Distance:    Right Eye Near:   Left Eye Near:    Bilateral Near:     Physical Exam Vitals and nursing note reviewed.  Constitutional:      General: She is not in acute distress.    Appearance: She  is well-developed.  HENT:     Head: Normocephalic.     Right Ear: Tympanic membrane, ear canal and external ear normal.     Left Ear: Tympanic membrane, ear canal and external ear normal.     Nose: Nose normal.     Mouth/Throat:     Mouth: Mucous membranes are moist.   Eyes:     Extraocular Movements: Extraocular movements intact.     Conjunctiva/sclera: Conjunctivae normal.     Pupils: Pupils are equal, round, and reactive to light.    Cardiovascular:     Rate and Rhythm: Regular rhythm.     Pulses: Normal pulses.     Heart sounds: Normal heart sounds.  Pulmonary:     Effort: Pulmonary effort is normal.     Breath sounds: Normal breath sounds.  Abdominal:     Palpations: Abdomen is soft.     Tenderness: There is no abdominal tenderness.   Musculoskeletal:     Cervical back: Normal range of motion.   Skin:    General: Skin is warm and dry.   Neurological:     General: No focal deficit present.     Mental Status: She is oriented to person, place, and time.   Psychiatric:        Mood and Affect: Mood normal.        Behavior: Behavior normal.      UC Treatments / Results  Labs (all labs ordered are listed, but only abnormal results are displayed) Labs Reviewed  POC SARS CORONAVIRUS 2 AG -  ED    EKG   Radiology No results found.  Procedures Procedures (including critical care time)  Medications Ordered in UC Medications - No data to display  Initial Impression / Assessment and Plan / UC Course  I have reviewed the triage vital signs and the nursing notes.  Pertinent labs & imaging results that were available during my care of the patient were reviewed by me and considered in my medical decision making (see chart for details).  COVID test was negative.  On exam, lung sounds are clear throughout, room air sats are at 95%.  The patient is well-appearing, symptoms are consistent with viral etiology.  Supportive care recommendations were provided and  discussed with the patient to include continuing over-the-counter analgesics, fluids, and rest.  Discussed follow-up indications with the patient.  Patient was in agreement with this plan of care and verbalizes understanding.  All questions were answered.  Patient stable for discharge.   Final Clinical Impressions(s) / UC Diagnoses   Final diagnoses:  Viral illness  Nonintractable headache, unspecified chronicity pattern, unspecified headache type     Discharge Instructions      Your COVID test was negative today. Continue over-the-counter analgesics such as Tylenol  or ibuprofen  as needed for pain, fever, or general discomfort. Apply cool compresses to the forehead and eyes to help with pain or discomfort. Make sure you are drinking plenty of fluids and getting plenty of rest.  Try to drink at least 8-10 8 ounce glasses of water daily while symptoms persist. Symptoms should improve over the next 5 to 7 days.  If symptoms fail to improve, or appear to be worsening, you may follow-up in this clinic or with your primary care physician for further evaluation. Follow-up as needed.     ED Prescriptions   None    PDMP not reviewed this encounter.   Gilmer Etta PARAS, NP 11/07/23 1452

## 2023-11-07 NOTE — ED Triage Notes (Signed)
 Pt reports the back of her eyes hurt (hurts to look in each direction),  headache, and body aches x 1 day

## 2023-11-19 DIAGNOSIS — Z419 Encounter for procedure for purposes other than remedying health state, unspecified: Secondary | ICD-10-CM | POA: Diagnosis not present

## 2023-12-11 ENCOUNTER — Encounter: Payer: Self-pay | Admitting: Obstetrics and Gynecology

## 2023-12-12 ENCOUNTER — Telehealth: Admitting: Physician Assistant

## 2023-12-12 DIAGNOSIS — B3731 Acute candidiasis of vulva and vagina: Secondary | ICD-10-CM | POA: Diagnosis not present

## 2023-12-12 MED ORDER — FLUCONAZOLE 150 MG PO TABS
150.0000 mg | ORAL_TABLET | ORAL | 0 refills | Status: DC | PRN
Start: 2023-12-12 — End: 2024-02-16

## 2023-12-12 NOTE — Patient Instructions (Signed)
 Winona Blush, thank you for joining Delon CHRISTELLA Dickinson, PA-C for today's virtual visit.  While this provider is not your primary care provider (PCP), if your PCP is located in our provider database this encounter information will be shared with them immediately following your visit.   A [facility] MyChart account gives you access to today's visit and all your visits, tests, and labs performed at [facility]  click here if you don't have a [facility] MyChart account or go to [email or web address]  Consent: (Patient) Bonnye Halle provided verbal consent for this virtual visit at the beginning of the encounter.  Current Medications:  Current Outpatient Medications:    fluconazole  (DIFLUCAN ) 150 MG tablet, Take 1 tablet (150 mg total) by mouth every 3 (three) days as needed., Disp: 3 tablet, Rfl: 0   clindamycin  (CLINDAGEL) 1 % gel, Apply topically 2 (two) times daily., Disp: 30 g, Rfl: 1   Medications ordered in this encounter:  Meds ordered this encounter  Medications   fluconazole  (DIFLUCAN ) 150 MG tablet    Sig: Take 1 tablet (150 mg total) by mouth every 3 (three) days as needed.    Dispense:  3 tablet    Refill:  0    Supervising Provider:   LAMPTEY, PHILIP O [[id number]]     *If you need refills on other medications prior to your next appointment, please contact your pharmacy*  Follow-Up: Call back or seek an in-person evaluation if the symptoms worsen or if the condition fails to improve as anticipated.  [facility] [phone]  Other Instructions Vaginal Probiotics: AZO vaginal probiotic OLLY Happy Hoo-Ha RAW Vaginal Care RenewLife Women's vaginal probiotic RepHresh Pro-B  Vaginal washes: Honey Pot Summer's Eve Vagisil Feminine cleanser  Boric Acid Suppositories   Healthy vaginal hygiene practices    -  Avoid sleeper pajamas. Nightgowns allow air to circulate.  Sleep without underpants whenever possible.   -  Wear cotton  underpants during the day. Double-rinse underwear after washing to avoid residual irritants. Do not use fabric softeners for underwear and swimsuits.   - Avoid tights, leotards, leggings, skinny jeans, and other tight-fitting clothing. Skirts and loose-fitting pants allow air to circulate.   - Avoid pantyliners.  Instead use tampons or cotton pads.   - Use the restroom after intercourse to help prevent UTI's   - Daily warm bathing is helpful:     - Soak in clean water (no soap) for 10 to 15 minutes. Adding vinegar or baking soda to the water has not been specifically studied and may not be better than clean water alone.      - Use soap to wash regions other than the genital area just before getting out of the tub. Limit use of any soap on genital areas. Use fragance-free soaps.     - Rinse the genital area well and gently pat dry.  Don't rub.  Hair dryer to assist with drying can be used only if on cool setting.     - Do not use bubble baths or perfumed soaps.   - Do not use any feminine sprays, douches or powders.  These contain chemicals that will irritate the skin.   - If the genital area is tender or swollen, cool compresses may relieve the discomfort. Unscented wet wipes can be used instead of toilet paper for wiping.    - Emollients, such as Vaseline, may help protect skin and can be applied to the irritated area.   - Always remember  to wipe front-to-back after bowel movements. Pat dry after urination.   - Do not sit in wet swimsuits for long periods of time after swimming    If you have been instructed to have an in-person evaluation today at a local Urgent Care facility, please use the link below. It will take you to a list of all of our available [facility], including address, phone number and hours of operation. Please do not delay care.  [facility]  If you or a family member do not have a primary care provider, use the link below to schedule a  visit and establish care. When you choose a [facility] primary care physician or advanced practice provider, you gain a long-term partner in health. Find a Primary Care Provider  Learn more about [facility] in-office and virtual care options: [facility] Get Care Now

## 2023-12-12 NOTE — Progress Notes (Signed)
 Virtual Visit Consent   Alexandra Collins, you are scheduled for a virtual visit with a [facility] provider today. Just as with appointments in the office, your consent must be obtained to participate. Your consent will be active for this visit and any virtual visit you may have with one of our providers in the next 365 days. If you have a MyChart account, a copy of this consent can be sent to you electronically.  As this is a virtual visit, video technology does not allow for your provider to perform a traditional examination. This may limit your provider's ability to fully assess your condition. If your provider identifies any concerns that need to be evaluated in person or the need to arrange testing (such as labs, EKG, etc.), we will make arrangements to do so. Although advances in technology are sophisticated, we cannot ensure that it will always work on either your end or our end. If the connection with a video visit is poor, the visit may have to be switched to a telephone visit. With either a video or telephone visit, we are not always able to ensure that we have a secure connection.  By engaging in this virtual visit, you consent to the provision of healthcare and authorize for your insurance to be billed (if applicable) for the services provided during this visit. Depending on your insurance coverage, you may receive a charge related to this service.  I need to obtain your verbal consent now. Are you willing to proceed with your visit today? Alexandra Collins has provided verbal consent on 12/12/2023 for a virtual visit (video or telephone). Alexandra CHRISTELLA Dickinson, PA-C  Date: 12/12/2023 10:51 AM   Virtual Visit via Video Note   I, Alexandra Collins, connected with  Alexandra Collins  ([id number], [birth year]) on 12/12/23 at 10:45 AM EDT by a video-enabled telemedicine application and verified that I am speaking with the correct person using two identifiers.  Location: Patient: Virtual Visit Location  Patient: Home Provider: Virtual Visit Location Provider: Home Office   I discussed the limitations of evaluation and management by telemedicine and the availability of in person appointments. The patient expressed understanding and agreed to proceed.    History of Present Illness: Alexandra Collins is a 28 y.o. who identifies as a female who was assigned female at birth, and is being seen today for vaginal irritation.  HPI: Vaginal Discharge The patient's primary symptoms include genital itching and vaginal discharge. The patient's pertinent negatives include no genital odor. This is a new problem. The current episode started in the past 7 days (4 days ago). The problem occurs constantly. The problem has been gradually worsening. The patient is experiencing no pain. Pertinent negatives include no chills, diarrhea, dysuria, fever, flank pain, frequency, hematuria, nausea, sore throat, urgency or vomiting. Associated symptoms comments: Vaginal irritation and redness. The vaginal discharge was white and thick (cottage cheese like). There has been no bleeding. She has not been passing clots. She has not been passing tissue. The symptoms are aggravated by tactile pressure. Treatments tried: vagisil. The treatment provided no relief. She is not sexually active. She uses nothing for contraception.     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    Shellfish Allergy Itching   Sulfamethoxazole Nausea And Vomiting   Shrimp Extract Hives   Augmentin [Amoxicillin-Pot Clavulanate] Rash   Medications:  Current Outpatient Medications:    fluconazole  (DIFLUCAN ) 150 MG tablet, Take 1 tablet (150 mg total) by mouth every 3 (three) days as needed., Disp: 3 tablet, Rfl: 0   clindamycin  (CLINDAGEL) 1 % gel, Apply topically 2 (two) times daily., Disp: 30 g, Rfl: 1  Observations/Objective: Patient is well-developed, well-nourished in no acute distress.  Resting comfortably at home.  Head is normocephalic, atraumatic.  No labored breathing.  Speech is clear and coherent with logical content.  Patient is alert and oriented at baseline.    Assessment and Plan: 1. Yeast vaginitis (Primary) - fluconazole  (DIFLUCAN ) 150 MG tablet; Take 1 tablet (150 mg total) by mouth every 3 (three) days as needed.  Dispense: 3 tablet; Refill: 0  - Symptoms consistent with yeast vaginitis - Fluconazole  prescribed - Limit bubble baths, scented lotions/soaps/detergents - Limit tight fitting clothing - Seek on person evaluation if not improving or if symptoms worsen   Follow Up Instructions: I discussed the assessment and treatment plan with the patient. The patient was provided an opportunity to ask questions and all were answered. The patient agreed with the plan and demonstrated an understanding of the instructions.  A copy of instructions were sent to the patient via MyChart unless otherwise noted below.    The patient was advised to call back or seek an in-person evaluation if the symptoms worsen or if the condition fails to improve as anticipated.    Alexandra CHRISTELLA Dickinson, PA-C

## 2023-12-20 DIAGNOSIS — Z419 Encounter for procedure for purposes other than remedying health state, unspecified: Secondary | ICD-10-CM | POA: Diagnosis not present

## 2024-02-16 ENCOUNTER — Ambulatory Visit
Admission: EM | Admit: 2024-02-16 | Discharge: 2024-02-16 | Disposition: A | Discharge status: Home / Self Care | Attending: Nurse Practitioner | Admitting: Nurse Practitioner

## 2024-02-16 DIAGNOSIS — B9689 Other specified bacterial agents as the cause of diseases classified elsewhere: Secondary | ICD-10-CM | POA: Diagnosis not present

## 2024-02-16 DIAGNOSIS — J019 Acute sinusitis, unspecified: Secondary | ICD-10-CM

## 2024-02-16 MED ORDER — DOXYCYCLINE HYCLATE 100 MG PO CAPS: 100.0000 mg | ORAL_CAPSULE | Freq: Two times a day (BID) | ORAL | 0 refills | Status: AC

## 2024-02-16 MED ORDER — PROMETHAZINE-DM 6.25-15 MG/5ML PO SYRP: 5.0000 mL | ORAL_SOLUTION | Freq: Every evening | ORAL | 0 refills | Status: DC | PRN

## 2024-02-16 MED ORDER — BENZONATATE 100 MG PO CAPS: 100.0000 mg | ORAL_CAPSULE | Freq: Three times a day (TID) | ORAL | 0 refills | Status: DC | PRN

## 2024-02-16 NOTE — ED Provider Notes (Signed)
 [facility]    CSN: [id number] Arrival date & time: 02/16/24  9171      History   Chief Complaint No chief complaint on file.   HPI Alexandra Collins is a 28 y.o. female.   Patient presents today with 1 week history of fever, congested cough, chest pain when she coughs, runny and stuffy nose, sinus pressure, headache, sore throat, nausea, decreased appetite, fatigue, and weakness.  She reports that fever and sore throat have improved.  No shortness of breath, chest tightness, ear pain, vomiting, or diarrhea.  No new rash.  Patient is 5th grade teacher.  Has taken Tylenol  and Benadryl  for symptoms with minimal improvement.    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 Authorizing Provider  benzonatate  (TESSALON ) 100 MG capsule Take 1 capsule (100 mg total) by mouth 3 (three) times daily as needed for cough. Do not take with alcohol or while operating or driving heavy machinery 89/0/74  Yes Chandra Raisin A, NP  doxycycline  (VIBRAMYCIN ) 100 MG capsule Take 1 capsule (100 mg total) by mouth 2 (two) times daily for 7 days. 02/16/24 02/23/24 Yes Chandra Raisin LABOR, NP  promethazine -dextromethorphan (PROMETHAZINE -DM) 6.25-15 MG/5ML syrup Take 5 mLs by mouth at bedtime  as needed for cough. Do not take with alcohol or while driving or operating heavy machinery.  May cause drowsiness. 02/16/24  Yes Chandra Raisin LABOR, NP  clindamycin  (CLINDAGEL) 1 % gel Apply topically 2 (two) times daily. 08/25/22   Levora Reyes SAUNDERS, MD  cetirizine  (ZYRTEC  ALLERGY) 10 MG tablet Take 1 tablet (10 mg total) by mouth daily. Patient not taking: Reported on 03/12/2020 01/04/20 04/29/20  Hall-Potvin, [place], PA-C  fluticasone  (FLONASE ) 50 MCG/ACT nasal spray Place 1 spray into both nostrils daily. Patient not taking: Reported on 03/12/2020 01/04/20 04/29/20  Hall-Potvin, [place], PA-C    Family History Family History  Problem Relation Age of Onset   Hypertension Mother    Asthma Father    Seizures Father    Hypertension Father    Asthma Sister    Asthma Maternal Aunt    Diabetes Neg Hx    Heart disease Neg Hx    Stroke Neg Hx     Social History Social History   Tobacco Use   Smoking status: Never   Smokeless tobacco: Never  Vaping Use   Vaping status: Never Used  Substance Use Topics   Alcohol use: No   Drug use: No  Allergies   Ceclor [cefaclor], Elemental sulfur, Penicillins, Shellfish allergy, Sulfamethoxazole, Shrimp extract, and Augmentin [amoxicillin-pot clavulanate]   Review of Systems Review of Systems Per HPI  Physical Exam Triage Vital Signs ED Triage Vitals  Encounter Vitals Group     BP 02/16/24 0901 (!) 141/101     Girls Systolic BP Percentile --      Girls Diastolic BP Percentile --      Boys Systolic BP Percentile --      Boys Diastolic BP Percentile --      Pulse Rate 02/16/24 0901 80     Resp 02/16/24 0901 20     Temp 02/16/24 0901 98.2 F (36.8 C)     Temp Source 02/16/24 0901 Oral     SpO2 02/16/24 0901 98 %     Weight --      Height --      Head Circumference --      Peak Flow --      Pain Score 02/16/24 0904 0     Pain Loc --      Pain Education --      Exclude from Growth Chart --    No data found.  Updated  Vital Signs BP (!) 141/101 (BP Location: Right Arm)   Pulse 80   Temp 98.2 F (36.8 C) (Oral)   Resp 20   LMP 02/06/2024 (Exact Date)   SpO2 98%   Breastfeeding No   Visual Acuity Right Eye Distance:   Left Eye Distance:   Bilateral Distance:    Right Eye Near:   Left Eye Near:    Bilateral Near:     Physical Exam Vitals and nursing note reviewed.  Constitutional:      General: She is not in acute distress.    Appearance: Normal appearance. She is not ill-appearing or toxic-appearing.  HENT:     Head: Normocephalic and atraumatic.     Right Ear: Tympanic membrane, ear canal and external ear normal.     Left Ear: Tympanic membrane, ear canal and external ear normal.     Nose: Congestion present. No rhinorrhea.     Right Sinus: Maxillary sinus tenderness present. No frontal sinus tenderness.     Left Sinus: Maxillary sinus tenderness present. No frontal sinus tenderness.     Mouth/Throat:     Mouth: Mucous membranes are moist.     Pharynx: Oropharynx is clear. No oropharyngeal exudate or posterior oropharyngeal erythema.  Eyes:     General: No scleral icterus.    Extraocular Movements: Extraocular movements intact.  Cardiovascular:     Rate and Rhythm: Normal rate and regular rhythm.  Pulmonary:     Effort: Pulmonary effort is normal. No respiratory distress.     Breath sounds: Normal breath sounds. No wheezing, rhonchi or rales.  Musculoskeletal:     Cervical back: Normal range of motion and neck supple.  Lymphadenopathy:     Cervical: No cervical adenopathy.  Skin:    General: Skin is warm and dry.     Capillary Refill: Capillary refill takes less than 2 seconds.     Coloration: Skin is not jaundiced or pale.     Findings: No erythema or rash.  Neurological:     Mental Status: She is alert and oriented to person, place, and time.     Motor: No weakness.  Psychiatric:        Behavior: Behavior is cooperative.      UC Treatments / Results  Labs (all labs  ordered are listed, but only abnormal results are displayed) Labs Reviewed - No data to display  EKG   Radiology No results found.  Procedures Procedures (including critical care time)  Medications Ordered in UC Medications - No data to display  Initial Impression / Assessment and Plan / UC Course  I have reviewed the triage vital signs and the nursing notes.  Pertinent labs & imaging results that were available during my care of the patient were reviewed by me and considered in my medical decision making (see chart for details).   Patient is mildly hypertensive in triage today, otherwise vital signs are stable.  1. Acute bacterial sinusitis Vitals and exam are reassuring Treat with doxycycline  twice daily for 7 days given allergy to penicillins and Augmentin Other supportive care discussed Return and ER precautions discussed Work excuse provided  The patient was given the opportunity to ask questions.  All questions answered to their satisfaction.  The patient is in agreement to this plan.   Final Clinical Impressions(s) / UC Diagnoses   Final diagnoses:  Acute bacterial sinusitis     Discharge Instructions      You have a bacterial sinus infection.  Take the doxycycline  as prescribed to treat it.  Symptoms should improve over the next week to 10 days.  If you develop chest pain or shortness of breath, go to the emergency room.  Some things that can make you feel better are: - Increased rest - Increasing fluid with water/sugar free electrolytes - Acetaminophen  and ibuprofen  as needed for fever/pain - Salt water gargling, chloraseptic spray and throat lozenges - OTC guaifenesin (Mucinex) 600 mg twice daily for congestion - Saline sinus flushes or a neti pot - Humidifying the air -Tessalon  Perles every 8 hours as needed for dry cough and cough syrup as needed     ED Prescriptions     Medication Sig Dispense Auth. Provider   doxycycline  (VIBRAMYCIN ) 100 MG  capsule Take 1 capsule (100 mg total) by mouth 2 (two) times daily for 7 days. 14 capsule Chandra Raisin A, NP   promethazine -dextromethorphan (PROMETHAZINE -DM) 6.25-15 MG/5ML syrup Take 5 mLs by mouth at bedtime as needed for cough. Do not take with alcohol or while driving or operating heavy machinery.  May cause drowsiness. 118 mL Chandra Raisin A, NP   benzonatate  (TESSALON ) 100 MG capsule Take 1 capsule (100 mg total) by mouth 3 (three) times daily as needed for cough. Do not take with alcohol or while operating or driving heavy machinery 21 capsule Chandra Raisin LABOR, NP      PDMP not reviewed this encounter.   Chandra Raisin LABOR, NP 02/16/24 1019

## 2024-02-16 NOTE — Discharge Instructions (Signed)
 You have a bacterial sinus infection.  Take the doxycycline  as prescribed to treat it.  Symptoms should improve over the next week to 10 days.  If you develop chest pain or shortness of breath, go to the emergency room.  Some things that can make you feel better are: - Increased rest - Increasing fluid with water/sugar free electrolytes - Acetaminophen  and ibuprofen  as needed for fever/pain - Salt water gargling, chloraseptic spray and throat lozenges - OTC guaifenesin (Mucinex) 600 mg twice daily for congestion - Saline sinus flushes or a neti pot - Humidifying the air -Tessalon  Perles every 8 hours as needed for dry cough and cough syrup as needed

## 2024-02-16 NOTE — ED Triage Notes (Signed)
 Pt reports cough, congestion,chest discomfrot fatigued x 1 week.

## 2024-03-01 ENCOUNTER — Ambulatory Visit

## 2024-03-01 ENCOUNTER — Ambulatory Visit
Admission: RE | Admit: 2024-03-01 | Discharge: 2024-03-01 | Disposition: A | Discharge status: Home / Self Care | Source: Ambulatory Visit | Attending: Family Medicine | Admitting: Family Medicine

## 2024-03-01 ENCOUNTER — Telehealth: Payer: Self-pay

## 2024-03-01 VITALS — BP 144/90 | HR 69 | Temp 98.9°F | Resp 18

## 2024-03-01 DIAGNOSIS — W19XXXA Unspecified fall, initial encounter: Secondary | ICD-10-CM

## 2024-03-01 DIAGNOSIS — G44209 Tension-type headache, unspecified, not intractable: Secondary | ICD-10-CM | POA: Diagnosis not present

## 2024-03-01 DIAGNOSIS — M549 Dorsalgia, unspecified: Secondary | ICD-10-CM | POA: Diagnosis not present

## 2024-03-01 MED ORDER — TIZANIDINE HCL 4 MG PO CAPS: 4.0000 mg | ORAL_CAPSULE | Freq: Three times a day (TID) | ORAL | 0 refills | Status: AC | PRN

## 2024-03-01 MED ORDER — NAPROXEN 500 MG PO TABS: 500.0000 mg | ORAL_TABLET | Freq: Two times a day (BID) | ORAL | 0 refills | Status: AC | PRN

## 2024-03-01 NOTE — Discharge Instructions (Signed)
 We will let you know if anything comes back abnormal on your back x-ray.  I have sent in a muscle relaxer, anti-inflammatory pain medication and you may use heat, massage, stretches, rest.  Follow-up for worsening or unresolving symptoms.

## 2024-03-01 NOTE — Telephone Encounter (Signed)
 Pt has been contacted and made aware of x-ray results and recommendations from the provider. Pt has verbalized understanding.

## 2024-03-01 NOTE — ED Triage Notes (Signed)
 Pt reports lower back pain after a fall. 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 Also has headache and neck stiffness as well denies hitting head. Landed on bottom. Pain has gotten increasingly worse.

## 2024-03-01 NOTE — ED Provider Notes (Signed)
 [facility]    CSN: [id number] Arrival date & time: 03/01/24  1650      History   Chief Complaint Chief Complaint  Patient presents with   Back Pain    Fallen 2 days ago pain is getting worse - Entered by patient    HPI Alexandra Collins is a 28 y.o. female.   Pt reports lower back pain after a fall. 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 Also has headache and neck stiffness as well denies hitting head. Landed on bottom. Pain has gotten increasingly worse.       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 Authorizing Provider  naproxen  (NAPROSYN ) 500 MG tablet Take 1 tablet (500 mg total) by mouth 2 (two) times daily as needed. 03/01/24  Yes Stuart Vernell Norris, PA-C  tiZANidine (ZANAFLEX) 4 MG capsule Take 1 capsule (4 mg total) by mouth 3 (three) times daily as needed for muscle spasms. Do not drink whole or drive while taking this medication.  May cause drowsiness. 03/01/24  Yes Stuart Vernell Norris, PA-C  benzonatate  (TESSALON ) 100 MG capsule Take 1 capsule (100 mg total) by mouth 3 (three) times daily as needed for cough. Do not take with alcohol or while operating or driving heavy machinery 89/0/74   Chandra Harlene LABOR,  NP  clindamycin  (CLINDAGEL) 1 % gel Apply topically 2 (two) times daily. 08/25/22   Levora Reyes SAUNDERS, MD  promethazine -dextromethorphan (PROMETHAZINE -DM) 6.25-15 MG/5ML syrup Take 5 mLs by mouth at bedtime as needed for cough. Do not take with alcohol or while driving or operating heavy machinery.  May cause drowsiness. 02/16/24   Chandra Harlene LABOR, NP  cetirizine  (ZYRTEC  ALLERGY) 10 MG tablet Take 1 tablet (10 mg total) by mouth daily. Patient not taking: Reported on 03/12/2020 01/04/20 04/29/20  Hall-Potvin, [place], PA-C  fluticasone  (FLONASE ) 50 MCG/ACT nasal spray Place 1 spray into both nostrils daily. Patient not taking: Reported on 03/12/2020 01/04/20 04/29/20  Hall-Potvin, [place], PA-C    Family History Family History  Problem Relation Age of Onset   Hypertension Mother    Asthma Father    Seizures Father    Hypertension Father    Asthma Sister    Asthma Maternal Aunt    Diabetes Neg Hx    Heart disease Neg Hx    Stroke Neg Hx     Social History Social History   Tobacco Use   Smoking status: Never  Smokeless tobacco: Never  Vaping Use   Vaping status: Never Used  Substance Use Topics   Alcohol use: No   Drug use: No     Allergies   Ceclor [cefaclor], Elemental sulfur, Penicillins, Shellfish allergy, Sulfamethoxazole, Shrimp extract, and Augmentin [amoxicillin-pot clavulanate]   Review of Systems Review of Systems PER HPI  Physical Exam Triage Vital Signs ED Triage Vitals  Encounter Vitals Group     BP 03/01/24 1656 (!) 144/90     Girls Systolic BP Percentile --      Girls Diastolic BP Percentile --      Boys Systolic BP Percentile --      Boys Diastolic BP Percentile --      Pulse Rate 03/01/24 1656 69     Resp 03/01/24 1656 18     Temp 03/01/24 1656 98.9 F (37.2 C)     Temp Source 03/01/24 1656 Oral     SpO2 03/01/24 1656 97 %     Weight --      Height --      Head Circumference --      Peak Flow --      Pain Score 03/01/24 1658 7     Pain  Loc --      Pain Education --      Exclude from Growth Chart --    No data found.  Updated Vital Signs BP (!) 144/90 (BP Location: Right Arm)   Pulse 69   Temp 98.9 F (37.2 C) (Oral)   Resp 18   LMP 02/06/2024 (Exact Date)   SpO2 97%   Breastfeeding No   Visual Acuity Right Eye Distance:   Left Eye Distance:   Bilateral Distance:    Right Eye Near:   Left Eye Near:    Bilateral Near:     Physical Exam Vitals and nursing note reviewed.  Constitutional:      Appearance: Normal appearance. She is not ill-appearing.  HENT:     Head: Atraumatic.     Mouth/Throat:     Mouth: Mucous membranes are moist.  Eyes:     Extraocular Movements: Extraocular movements intact.     Conjunctiva/sclera: Conjunctivae normal.  Cardiovascular:     Rate and Rhythm: Normal rate.  Pulmonary:     Effort: Pulmonary effort is normal.     Breath sounds: Normal breath sounds.  Musculoskeletal:        General: Tenderness present. No swelling. Normal range of motion.     Cervical back: Normal range of motion and neck supple.     Comments: No deformities to palpation of midline spine.  Tenderness over thoracic region but otherwise no midline tenderness diffusely.  Normal gait and range of motion.  Tenderness to palpation to bilateral thoracic paraspinal musculature.  Negative straight leg raise bilateral lower extremities.  Skin:    General: Skin is warm and dry.     Findings: No bruising.  Neurological:     Mental Status: She is alert and oriented to person, place, and time.     Cranial Nerves: No cranial nerve deficit.     Motor: No weakness.     Gait: Gait normal.     Comments: Bilateral lower extremities neurovascularly intact  Psychiatric:        Mood and Affect: Mood normal.        Thought Content: Thought content normal.        Judgment: Judgment normal.      UC Treatments / Results  Labs (  all labs ordered are listed, but only abnormal results are displayed) Labs Reviewed - No  data to display  EKG   Radiology DG Thoracic Spine 2 View Result Date: 03/01/2024 CLINICAL DATA:  Mid to upper back pain after fall 2 days ago EXAM: THORACIC SPINE 2 VIEWS COMPARISON:  04/26/2020 FINDINGS: Slight convex rightward scoliosis centered in the lower thoracic spine. No fracture or subluxation. No focal bone lesion. IMPRESSION: Slight dextroscoliosis.  No acute bony abnormality. Electronically Signed   By: Franky Crease M.D.   On: 03/01/2024 17:39    Procedures Procedures (including critical care time)  Medications Ordered in UC Medications - No data to display  Initial Impression / Assessment and Plan / UC Course  I have reviewed the triage vital signs and the nursing notes.  Pertinent labs & imaging results that were available during my care of the patient were reviewed by me and considered in my medical decision making (see chart for details).     X-ray of the thoracic spine negative for acute bony abnormality.  Suspect muscular strain and contusion from fall.  Treat with Zanaflex, naproxen , heat, massage, stretches, rest.  Return for worsening symptoms.  Final Clinical Impressions(s) / UC Diagnoses   Final diagnoses:  Mid back pain  Tension headache  Fall, initial encounter     Discharge Instructions      We will let you know if anything comes back abnormal on your back x-ray.  I have sent in a muscle relaxer, anti-inflammatory pain medication and you may use heat, massage, stretches, rest.  Follow-up for worsening or unresolving symptoms.    ED Prescriptions     Medication Sig Dispense Auth. Provider   tiZANidine (ZANAFLEX) 4 MG capsule Take 1 capsule (4 mg total) by mouth 3 (three) times daily as needed for muscle spasms. Do not drink whole or drive while taking this medication.  May cause drowsiness. 15 capsule Stuart Vernell Norris, PA-C   naproxen  (NAPROSYN ) 500 MG tablet Take 1 tablet (500 mg total) by mouth 2 (two) times daily as needed. 20 tablet  Stuart Vernell Norris, [place]      PDMP not reviewed this encounter.   Stuart Vernell Norris, [place] 03/01/24 [phone]

## 2024-04-01 ENCOUNTER — Ambulatory Visit
Admission: EM | Admit: 2024-04-01 | Discharge: 2024-04-01 | Disposition: A | Discharge status: Home / Self Care | Attending: Nurse Practitioner | Admitting: Nurse Practitioner

## 2024-04-01 ENCOUNTER — Encounter: Payer: Self-pay | Admitting: Emergency Medicine

## 2024-04-01 DIAGNOSIS — U071 COVID-19: Secondary | ICD-10-CM

## 2024-04-01 DIAGNOSIS — H6993 Unspecified Eustachian tube disorder, bilateral: Secondary | ICD-10-CM | POA: Diagnosis not present

## 2024-04-01 LAB — POC COVID19/FLU A&B COMBO
Covid Antigen, POC: POSITIVE — AB
Influenza A Antigen, POC: NEGATIVE
Influenza B Antigen, POC: NEGATIVE

## 2024-04-01 MED ORDER — PSEUDOEPH-BROMPHEN-DM 30-2-10 MG/5ML PO SYRP: 5.0000 mL | ORAL_SOLUTION | Freq: Four times a day (QID) | ORAL | 0 refills | Status: AC | PRN

## 2024-04-01 MED ORDER — CETIRIZINE HCL 10 MG PO TABS: 10.0000 mg | ORAL_TABLET | Freq: Every day | ORAL | 0 refills | Status: AC

## 2024-04-01 MED ORDER — FLUTICASONE PROPIONATE 50 MCG/ACT NA SUSP: 2.0000 | Freq: Every day | NASAL | 0 refills | Status: AC

## 2024-04-01 MED ORDER — PAXLOVID (300/100) 20 X 150 MG & 10 X 100MG PO TBPK: 3.0000 | ORAL_TABLET | Freq: Two times a day (BID) | ORAL | 0 refills | Status: AC

## 2024-04-01 NOTE — Discharge Instructions (Addendum)
 The COVID/flu test was positive for COVID. Take medication as prescribed. Increase fluids and allow for plenty of rest. You may continue over-the-counter Tylenol  or ibuprofen  as needed for pain, fever, or general discomfort. Recommend the use of normal saline nasal spray throughout the day for nasal congestion and runny nose. For your cough, recommend the use of a humidifier in your bedroom at nighttime during sleep and sleeping elevated on pillows while symptoms persist. If you develop fever, you will need to remain home until you have been fever free for 24 hours with no medication. While you are taking Paxlovid , continue to wear your mask.  If you continue to experience symptoms after completing Paxlovid , wear your mask for an additional 5 days. Go to the emergency department if you experience shortness of breath, difficulty breathing, or other concerns. Follow-up as needed.

## 2024-04-01 NOTE — ED Triage Notes (Signed)
 Headache, nasal congestion, since Thursday.  Has been taking mucinex and ibuprofen .  States feels pain in both ears.  States symptoms have become worse.

## 2024-04-01 NOTE — ED Provider Notes (Signed)
 [facility]    CSN: [id number] Arrival date & time: 04/01/24  1006      History   Chief Complaint No chief complaint on file.   HPI Alexandra Collins is a 28 y.o. female.   The history is provided by the patient.   Patient presents for complaints of headache, nasal congestion, bilateral ear pain, and cough.  Symptoms have been present for the past 3 days.  She believes that she may have had a fever when her symptoms initially started.  She denies ear drainage, sore throat, wheezing, difficulty breathing, abdominal pain, nausea, vomiting, diarrhea, or rash.  Patient states that she did go on a field trip with her child and since that time, she developed symptoms.  States she has been using Mucinex and ibuprofen  for her symptoms.  States that she took Benadryl  last evening.  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 Authorizing Provider  brompheniramine-pseudoephedrine-DM 30-2-10 MG/5ML syrup Take 5 mLs by mouth 4 (four) times daily as needed. 04/01/24  Yes Leath-Warren, Etta PARAS, NP  cetirizine  (ZYRTEC ) 10 MG tablet Take 1 tablet (10 mg total) by mouth daily. 04/01/24  Yes Leath-Warren, Etta PARAS, NP  fluticasone  (FLONASE ) 50 MCG/ACT nasal spray Place 2 sprays  into both nostrils daily. 04/01/24  Yes Leath-Warren, Etta PARAS, NP  nirmatrelvir/ritonavir (PAXLOVID , 300/100,) 20 x 150 MG & 10 x 100MG  TBPK Take 3 tablets by mouth 2 (two) times daily for 5 days. Patient GFR is 89. Take nirmatrelvir (150 mg) two tablets twice daily for 5 days and ritonavir (100 mg) one tablet twice daily for 5 days. 04/01/24 04/06/24 Yes Leath-Warren, Etta PARAS, NP  clindamycin  (CLINDAGEL) 1 % gel Apply topically 2 (two) times daily. 08/25/22   Levora Reyes SAUNDERS, MD  naproxen  (NAPROSYN ) 500 MG tablet Take 1 tablet (500 mg total) by mouth 2 (two) times daily as needed. 03/01/24   Stuart Vernell Norris, PA-C  tiZANidine  (ZANAFLEX ) 4 MG capsule Take 1 capsule (4 mg total) by mouth 3 (three) times daily as needed for muscle spasms. Do not drink whole or drive while taking this medication.  May cause drowsiness. 03/01/24   Stuart Vernell Norris, PA-C    Family History Family History  Problem Relation Age of Onset   Hypertension Mother    Asthma Father    Seizures Father    Hypertension Father    Asthma Sister    Asthma Maternal Aunt    Diabetes Neg Hx  Heart disease Neg Hx    Stroke Neg Hx     Social History Social History   Tobacco Use   Smoking status: Never   Smokeless tobacco: Never  Vaping Use   Vaping status: Never Used  Substance Use Topics   Alcohol use: No   Drug use: No     Allergies   Ceclor [cefaclor], Elemental sulfur, Penicillins, Shellfish allergy, Sulfamethoxazole, Shrimp extract, and Augmentin [amoxicillin-pot clavulanate]   Review of Systems Review of Systems Per HPI  Physical Exam Triage Vital Signs ED Triage Vitals  Encounter Vitals Group     BP 04/01/24 1109 136/87     Girls Systolic BP Percentile --      Girls Diastolic BP Percentile --      Boys Systolic BP Percentile --      Boys Diastolic BP Percentile --      Pulse Rate 04/01/24 1109 86     Resp 04/01/24 1109 18     Temp 04/01/24 1109 98.5 F (36.9 C)     Temp  Source 04/01/24 1109 Oral     SpO2 04/01/24 1109 97 %     Weight --      Height --      Head Circumference --      Peak Flow --      Pain Score 04/01/24 1111 7     Pain Loc --      Pain Education --      Exclude from Growth Chart --    No data found.  Updated Vital Signs BP 136/87 (BP Location: Right Arm)   Pulse 86   Temp 98.5 F (36.9 C) (Oral)   Resp 18   LMP 04/01/2024 (Exact Date)   SpO2 97%   Visual Acuity Right Eye Distance:   Left Eye Distance:   Bilateral Distance:    Right Eye Near:   Left Eye Near:    Bilateral Near:     Physical Exam Vitals and nursing note reviewed.  Constitutional:      General: She is not in acute distress.    Appearance: Normal appearance. She is well-developed.  HENT:     Head: Normocephalic and atraumatic.     Right Ear: Ear canal and external ear normal. A middle ear effusion is present.     Left Ear: Ear canal and external ear normal. A middle ear effusion is present.     Nose: Congestion present.     Right Turbinates: Enlarged and swollen.     Left Turbinates: Enlarged and swollen.     Right Sinus: No maxillary sinus tenderness or frontal sinus tenderness.     Left Sinus: No maxillary sinus tenderness or frontal sinus tenderness.     Mouth/Throat:     Lips: Pink.     Mouth: Mucous membranes are moist.     Pharynx: Uvula midline. Postnasal drip present. No pharyngeal swelling, oropharyngeal exudate, posterior oropharyngeal erythema or uvula swelling.     Comments: Cobblestoning present to posterior oropharynx  Eyes:     Extraocular Movements: Extraocular movements intact.     Conjunctiva/sclera: Conjunctivae normal.     Pupils: Pupils are equal, round, and reactive to light.  Neck:     Thyroid: No thyromegaly.     Trachea: No tracheal deviation.  Cardiovascular:     Rate and Rhythm: Normal rate and regular rhythm.     Pulses: Normal pulses.     Heart sounds: Normal heart sounds.  Pulmonary:  Effort: Pulmonary effort  is normal. No respiratory distress.     Breath sounds: Normal breath sounds. No stridor. No wheezing, rhonchi or rales.  Abdominal:     General: Bowel sounds are normal.     Palpations: Abdomen is soft.     Tenderness: There is no abdominal tenderness.  Musculoskeletal:     Cervical back: Normal range of motion and neck supple.  Skin:    General: Skin is warm and dry.  Neurological:     General: No focal deficit present.     Mental Status: She is alert and oriented to person, place, and time.  Psychiatric:        Mood and Affect: Mood normal.        Behavior: Behavior normal.        Thought Content: Thought content normal.        Judgment: Judgment normal.      UC Treatments / Results  Labs (all labs ordered are listed, but only abnormal results are displayed) Labs Reviewed  POC COVID19/FLU A&B COMBO - Abnormal; Notable for the following components:      Result Value   Covid Antigen, POC Positive (*)    All other components within normal limits    EKG   Radiology No results found.  Procedures Procedures (including critical care time)  Medications Ordered in UC Medications - No data to display  Initial Impression / Assessment and Plan / UC Course  I have reviewed the triage vital signs and the nursing notes.  Pertinent labs & imaging results that were available during my care of the patient were reviewed by me and considered in my medical decision making (see chart for details).  On exam, the patient's lung sounds are clear throughout, room air sats are at 97%.  She does have moderate bilateral middle ear effusions noted on her exam.  COVID/flu test is positive for COVID.  Patient has elected to begin Paxlovid  (patient denies history of kidney disease, use of blood thinning medications, or use of statins).  Will provide symptomatic treatment for her COVID symptoms and bilateral eustachian tube dysfunction.  Will provide symptomatic treatment with Bromfed-DM, cetirizine   10 mg, and fluticasone  50 mcg nasal spray.  Supportive care recommendations were provided and discussed with the patient to include fluids, rest, over-the-counter analgesics, use of normal saline nasal spray, and use of a humidifier during sleep.  Discussed indications with patient regarding follow-up.  Patient was in agreement with this plan of care and verbalizes understanding.  All questions were answered.  Patient stable for discharge.  Work note was provided.   Final Clinical Impressions(s) / UC Diagnoses   Final diagnoses:  Acute dysfunction of Eustachian tube, bilateral  COVID     Discharge Instructions      The COVID/flu test was positive for COVID. Take medication as prescribed. Increase fluids and allow for plenty of rest. You may continue over-the-counter Tylenol  or ibuprofen  as needed for pain, fever, or general discomfort. Recommend the use of normal saline nasal spray throughout the day for nasal congestion and runny nose. For your cough, recommend the use of a humidifier in your bedroom at nighttime during sleep and sleeping elevated on pillows while symptoms persist. If you develop fever, you will need to remain home until you have been fever free for 24 hours with no medication. While you are taking Paxlovid , continue to wear your mask.  If you continue to experience symptoms after completing Paxlovid , wear your mask for an  additional 5 days. Go to the emergency department if you experience shortness of breath, difficulty breathing, or other concerns. Follow-up as needed.     ED Prescriptions     Medication Sig Dispense Auth. Provider   brompheniramine-pseudoephedrine-DM 30-2-10 MG/5ML syrup Take 5 mLs by mouth 4 (four) times daily as needed. 140 mL Leath-Warren, Etta PARAS, NP   cetirizine  (ZYRTEC ) 10 MG tablet Take 1 tablet (10 mg total) by mouth daily. 30 tablet Leath-Warren, Etta PARAS, NP   fluticasone  (FLONASE ) 50 MCG/ACT nasal spray Place 2 sprays into both  nostrils daily. 16 g Leath-Warren, Etta PARAS, NP   nirmatrelvir/ritonavir (PAXLOVID , 300/100,) 20 x 150 MG & 10 x 100MG  TBPK Take 3 tablets by mouth 2 (two) times daily for 5 days. Patient GFR is 89. Take nirmatrelvir (150 mg) two tablets twice daily for 5 days and ritonavir (100 mg) one tablet twice daily for 5 days. 30 tablet Leath-Warren, Etta PARAS, NP      PDMP not reviewed this encounter.   Gilmer Etta PARAS, NP 04/01/24 1136

## 2024-04-08 ENCOUNTER — Encounter (HOSPITAL_COMMUNITY): Payer: Self-pay | Admitting: Emergency Medicine

## 2024-04-08 ENCOUNTER — Emergency Department (HOSPITAL_COMMUNITY)

## 2024-04-08 ENCOUNTER — Emergency Department (HOSPITAL_COMMUNITY)
Admission: EM | Admit: 2024-04-08 | Discharge: 2024-04-08 | Disposition: A | Discharge status: Home / Self Care | Attending: Emergency Medicine | Admitting: Emergency Medicine

## 2024-04-08 DIAGNOSIS — G259 Extrapyramidal and movement disorder, unspecified: Secondary | ICD-10-CM | POA: Diagnosis not present

## 2024-04-08 DIAGNOSIS — R410 Disorientation, unspecified: Secondary | ICD-10-CM | POA: Insufficient documentation

## 2024-04-08 DIAGNOSIS — R260 Ataxic gait: Secondary | ICD-10-CM

## 2024-04-08 DIAGNOSIS — R519 Headache, unspecified: Secondary | ICD-10-CM | POA: Diagnosis present

## 2024-04-08 DIAGNOSIS — R202 Paresthesia of skin: Secondary | ICD-10-CM

## 2024-04-08 DIAGNOSIS — G43009 Migraine without aura, not intractable, without status migrainosus: Secondary | ICD-10-CM

## 2024-04-08 DIAGNOSIS — G43809 Other migraine, not intractable, without status migrainosus: Secondary | ICD-10-CM | POA: Insufficient documentation

## 2024-04-08 DIAGNOSIS — I10 Essential (primary) hypertension: Secondary | ICD-10-CM | POA: Diagnosis not present

## 2024-04-08 DIAGNOSIS — R251 Tremor, unspecified: Secondary | ICD-10-CM | POA: Insufficient documentation

## 2024-04-08 DIAGNOSIS — Z8616 Personal history of COVID-19: Secondary | ICD-10-CM | POA: Diagnosis not present

## 2024-04-08 MED ORDER — GADOBUTROL 1 MMOL/ML IV SOLN
10.0000 mL | Freq: Once | INTRAVENOUS | Status: AC | PRN
  Administered 2024-04-08: 10 mL via INTRAVENOUS

## 2024-04-08 MED ORDER — METOCLOPRAMIDE HCL 10 MG PO TABS: 10.0000 mg | ORAL_TABLET | Freq: Four times a day (QID) | ORAL | 0 refills | Status: AC

## 2024-04-08 MED ORDER — METOCLOPRAMIDE HCL 5 MG/ML IJ SOLN
10.0000 mg | Freq: Once | INTRAMUSCULAR | Status: AC
  Administered 2024-04-08: 10 mg via INTRAVENOUS
  Filled 2024-04-08: qty 2

## 2024-04-08 MED ORDER — DIPHENHYDRAMINE HCL 50 MG/ML IJ SOLN
25.0000 mg | Freq: Once | INTRAMUSCULAR | Status: AC
  Administered 2024-04-08: 25 mg via INTRAVENOUS
  Filled 2024-04-08: qty 1

## 2024-04-08 MED ORDER — KETOROLAC TROMETHAMINE 30 MG/ML IJ SOLN
30.0000 mg | Freq: Once | INTRAMUSCULAR | Status: AC
  Administered 2024-04-08: 30 mg via INTRAVENOUS
  Filled 2024-04-08: qty 1

## 2024-04-08 NOTE — ED Triage Notes (Signed)
 Pt here from [facility] signed out ama for shaking that started this am , slight headache,

## 2024-04-08 NOTE — Consult Note (Signed)
 [facility] Telemedicine Consult   Requesting Provider: Cleotilde, B Consult Participants: Patient, family member, nursing Location of the provider: [place], [place] Location of the patient: [facility]  This consult was provided via telemedicine with 2-way video and audio communication. The patient/family was informed that care would be provided in this way and agreed to receive care in this manner.    Chief Complaint: head movements  HPI: 28 yo F with abnormal movements that started this morning around 11:30 am.  She states that initially she had shaking on both sides, though this has improved over time.  They sought treatment at the urgent care for these symptoms.  She had a transient episode of tingling on her right side that improved.  It started on the right side of her face and quickly spread down to her right arm.  It lasted for a few minutes and then resolved.  She previously had a retro-orbital headache on the left, but this is also resolved.  She does have headaches from time to time which are bifrontal in location, and she states that they last for about 30 minutes at a time.  She denies photophobia.  She has had some nausea today.  Currently most of her symptoms are resolved, she still has a little bit of shakiness in her arms, but her head is persistently moving.  She can cause it to stop moving for a brief period of time with effort, though when she stops thinking about it it begins moving again.  She does have some back and neck pain.  Also, she was recently diagnosed with COVID.  Exam: Vitals:   04/08/24 1520  BP: (!) 147/90  Pulse: 67  Resp: 18  Temp: 98 F (36.7 C)  SpO2: 100%    General: in bed, NAD  1A: Level of Consciousness - 0 1B: Ask Month and Age - 0 1C: 'Blink Eyes' & 'Squeeze Hands' - 0 2: Test Horizontal Extraocular Movements - 0 3: Test Visual Fields - 0 4: Test Facial Palsy - 0 5A: Test Left Arm Motor Drift - 0 5B: Test Right Arm Motor Drift -  0 6A: Test Left Leg Motor Drift - 0 6B: Test Right Leg Motor Drift - 0 7: Test Limb Ataxia - 0 8: Test Sensation - 0 9: Test Language/Aphasia- 0 10: Test Dysarthria - 0 11: Test Extinction/Inattention - 0 NIHSS score: 0  She has persistent titubation throughout my examination.  She does not have any significant appendicular tremor that I can appreciate over the camera.  Imaging Reviewed: MRI - negative  Labs reviewed in epic and pertinent values follow: normal   Assessment: 28 yo F with bilateral abnormal movements as well as right sided paresthesia.  Some of the symptoms including spreading paresthesia as well as retro-orbital headache are quite suggestive of complicated migraine, but her abnormal movements would be less common for this.  The head movement is titubation, and this can be associated with movement disorders, cervical dystonia, functional neurological disorder.  I would favor treating this as complicated migraine with Reglan  and Toradol .  If her symptoms improve, I would recommend no further treatment or workup unless the symptoms return.  If she continues to have the abnormal head movements, could consider treatment with an anticholinergic. Given that the diagnosis is not certain, and there is evidence that benadryl  is helpful, I would start with benadryl  25mg  BID to see if that provides some improvement.   Recommendations:  1) Reglan  + toradol  for complicated migraine.  2) If titubation remains, could try benadryl  25mg  BID to see if it provides relief as she is awaiting outpatient appointment.  3) If other oral agents are desired, baclofen can be used as well if this does indeed represent dystonia.  4) I would have her follow up with outpatient neurology.   Aisha Seals, MD [facility] [phone]  If 7pm- 7am, please page neurology on call as listed in [facility].

## 2024-04-08 NOTE — Discharge Instructions (Addendum)
 Your MRI today did not show evidence of a stroke or tumor.  You have been treated today for a complicated migraine.  Please call the neurology office listed to arrange follow-up appointment.  You can take Reglan  1 tablet every 8 hours as needed for headaches, if you continue to have some of the head rocking and jittery movements you can take Benadryl  1 tablet every 8 hours as needed as well.  Please call the phone number for the neurologist to make an appointment for the next couple of weeks

## 2024-04-08 NOTE — Nursing Note (Signed)
 After Dr. Rosamond talked with patient patient still requesting to sign out against medical advice.

## 2024-04-08 NOTE — ED Provider Notes (Signed)
 [place] [facility] DEPARTMENT AT [facility] Provider Note   CSN: [id number] Arrival date & time: 04/08/24  1512     Patient presents with: Shaking   Alexandra Collins is a 28 y.o. female.   HPI     Alexandra Collins is a 28 y.o. female past medical history of hypertension, anemia, headaches who presents to the Emergency Department requesting evaluation for shaking and bobbing of her head with confusion.  She states her symptoms began around 9 AM this morning.  She was getting ready for church when she noticed that she was having some confusion and difficulty with getting my words out approximately 2 hours later significant other began noticing that she was bobbing her head back-and-forth.  She went to urgent care for evaluation developed some numbness and tingling sensations of her right arm to the level of her wrist and her right face.  Those symptoms have since resolved.  She also had a frontal headache that has now resolved.  She was advised to go to the ER for further evaluation.  She went to [facility] and had blood work.  She states they wanted to get a CT scan and gave her Ativan but she decided to leave AGAINST MEDICAL ADVICE and came here. Of note, patient states that she had COVID last week, took Benadryl  and cough medication but feels that her symptoms from COVID resolved.  She also notes that she fell yesterday while rollerskating and landed on her buttocks.  Fall was somewhat controlled and minimal.  She felt a sharp jolt of pain from her lower back that radiated to the base of her skull when she fell.  Symptoms from the fall resolved.  She was ambulatory and asymptomatic after the fall.  She did not strike her head.  She denies any neck pain.  Denies any visual changes.  Prior to Admission medications   Medication Sig Start Date End Date Taking? Authorizing Provider  brompheniramine-pseudoephedrine-DM 30-2-10 MG/5ML syrup Take 5 mLs by mouth 4 (four) times daily as  needed. 04/01/24   Leath-Warren, Etta PARAS, NP  cetirizine  (ZYRTEC ) 10 MG tablet Take 1 tablet (10 mg total) by mouth daily. 04/01/24   Leath-Warren, Etta PARAS, NP  clindamycin  (CLINDAGEL) 1 % gel Apply topically 2 (two) times daily. 08/25/22   Levora Reyes SAUNDERS, MD  fluticasone  (FLONASE ) 50 MCG/ACT nasal spray Place 2 sprays into both nostrils daily. 04/01/24   Leath-Warren, Etta PARAS, NP  naproxen  (NAPROSYN ) 500 MG tablet Take 1 tablet (500 mg total) by mouth 2 (two) times daily as needed. 03/01/24   Stuart Vernell Norris, PA-C  tiZANidine  (ZANAFLEX ) 4 MG capsule Take 1 capsule (4 mg total) by mouth 3 (three) times daily as needed for muscle spasms. Do not drink whole or drive while taking this medication.  May cause drowsiness. 03/01/24   Stuart Vernell Norris, PA-C    Allergies: Ceclor [cefaclor], Elemental sulfur, Penicillins, Shellfish allergy, Sulfamethoxazole, Shrimp extract, and Augmentin [amoxicillin-pot clavulanate]    Review of Systems  Constitutional:  Negative for chills and fever.  HENT:  Negative for facial swelling.   Eyes:  Negative for visual disturbance.  Respiratory:  Negative for shortness of breath.   Cardiovascular:  Negative for chest pain.  Gastrointestinal:  Positive for nausea. Negative for vomiting.  Genitourinary:  Negative for dysuria.  Musculoskeletal:  Negative for back pain and neck pain.  Neurological:  Positive for tremors, speech difficulty, numbness (Right face right arm) and headaches. Negative for dizziness and facial asymmetry.  Updated Vital Signs BP (!) 147/90   Pulse 67   Temp 98 F (36.7 C) (Oral)   Resp 18   LMP 04/01/2024 (Exact Date)   SpO2 100%   Physical Exam Vitals reviewed.  Constitutional:      General: She is not in acute distress.    Appearance: Normal appearance. She is not toxic-appearing.  HENT:     Head: Atraumatic.  Eyes:     Extraocular Movements: Extraocular movements intact.     Right eye: Normal extraocular  motion and no nystagmus.     Left eye: Normal extraocular motion and no nystagmus.     Conjunctiva/sclera: Conjunctivae normal.     Pupils: Pupils are equal, round, and reactive to light.  Neck:     Trachea: Phonation normal.  Cardiovascular:     Rate and Rhythm: Normal rate and regular rhythm.     Pulses: Normal pulses.  Pulmonary:     Effort: Pulmonary effort is normal.     Breath sounds: Normal breath sounds.  Chest:     Chest wall: No tenderness.  Musculoskeletal:        General: Normal range of motion.     Cervical back: Normal range of motion. No tenderness. No spinous process tenderness or muscular tenderness.  Skin:    General: Skin is warm.     Capillary Refill: Capillary refill takes less than 2 seconds.  Neurological:     Mental Status: She is alert.     Cranial Nerves: No facial asymmetry.     Sensory: Sensation is intact.     Motor: Motor function is intact. No abnormal muscle tone or pronator drift.     Coordination: Coordination abnormal. Finger-Nose-Finger Test abnormal.     Gait: Gait is intact.     Comments: Speech is clear, mentating well.  She is slow to answer questions, but responds appropriately.  She has a repetitive bobbing-type movement of her head on exam and during finger nose testing, she leans to left when touching her nose.       (all labs ordered are listed, but only abnormal results are displayed) Labs Reviewed  VITAMIN B12    EKG: None  Radiology: MR MRV HEAD W WO CONTRAST Result Date: 04/08/2024 EXAM: MRI BRAIN WITHOUT AND WITH CONTRAST 04/08/2024 05:22:55 PM TECHNIQUE: Multiplanar multisequence MRI of the head/brain was performed without and with the administration of 10 mL gadobutrol  (GADAVIST ) 1 MMOL/ML injection 10 mL GADOBUTROL  1 MMOL/ML IV SOLN intravenous contrast. COMPARISON: None available. CLINICAL HISTORY: Dural venous sinus thrombosis suspected. FINDINGS: BRAIN AND VENTRICLES: No acute infarct. No intracranial hemorrhage. No  mass. No midline shift. No hydrocephalus. The left transverse sinus is dominant. The sella is unremarkable. Normal flow voids. ORBITS: No acute abnormality. SINUSES AND MASTOIDS: No acute abnormality. BONES AND SOFT TISSUES: Normal marrow signal. No acute soft tissue abnormality. IMPRESSION: 1. No evidence of dural venous sinus thrombosis. Electronically signed by: Lonni Necessary MD 04/08/2024 05:44 PM EST RP Workstation: [id number]   MR Brain W and Wo Contrast Result Date: 04/08/2024 EXAM: MRI BRAIN WITH AND WITHOUT CONTRAST 04/08/2024 05:22:55 PM TECHNIQUE: Multiplanar multisequence MRI of the head/brain was performed with and without the administration of intravenous contrast. COMPARISON: None available. CLINICAL HISTORY: Neuro deficit, acute, stroke suspected. Episode of confusion and abnormal speech. Paresthesias in the right upper extremity. FINDINGS: BRAIN AND VENTRICLES: No acute infarct. No acute intracranial hemorrhage. No mass effect or midline shift. No hydrocephalus. The sella is unremarkable. Normal flow voids. No mass  or abnormal enhancement. ORBITS: No acute abnormality. SINUSES: Moderate mucosal thickening is present in the left sphenoid sinus. BONES AND SOFT TISSUES: Normal bone marrow signal and enhancement. No acute soft tissue abnormality. IMPRESSION: 1. No acute intracranial abnormality. 2. No mass or abnormal enhancement. 3. Moderate mucosal thickening in the left sphenoid sinus. Electronically signed by: Lonni Necessary MD 04/08/2024 05:41 PM EST RP Workstation: [id number]     Procedures   Medications Ordered in the ED - No data to display                                  Medical Decision Making   Pt her from [facility] where she left AMA, and came here for evaluation of confusion, involuntary movements of her head frontal headache that is now resolved and intermittent numbness and tingling of her right face and arm.  No similar symptoms previously.  No vomiting or  reported facial weakness or lower extremity weakness.  She does endorse having a mechanical fall yesterday while rollerskating but states that she landed on her buttocks and did not hit her head.  She also tested positive for COVID last week, but feels as though her symptoms has now resolved.  Patient is pleasant, mentating well on my exam.  She does have an involuntary repetitive bobbing movement of her head.  No headache currently.  I do not appreciate any unilateral weakness or facial weakness on exam.  There is no nystagmus.  She leans to the left with right sided finger-nose testing.  I am able to review labs from [facility] from earlier today.  Her magnesium  CBC, complete metabolic panel were reassuring.  She had a negative UDS.  Urinalysis, urine pregnancy test were negative  Exam is concerning for acute neurologic process, specifically stroke versus dural venous sinus thrombosis.  Will proceed with MRI imaging here  Amount and/or Complexity of Data Reviewed Labs: ordered. Radiology: ordered.    Details: MRI brain with without without acute intracranial abnormality  MRI of the brain with without no evidence of stroke or dural venous sinus thrombosis Discussion of management or test interpretation with external provider(s):   Pt also seen by Dr. Cleotilde and care plan discussed.  Patient to have tele neurology consult  Patient had consult with Dr. Michaela with neurology.  Recommended treatment for complicated migraine.  On recheck, patient resting comfortably.  She is given migraine cocktail and will reassess, if improving will likely be discharged home with neurology follow-up.  Dr. Cleotilde to reevaluate    Risk Prescription drug management.        Final diagnoses:  Atypical migraine    ED Discharge Orders     None          Alexandra Collins, DEVONNA 04/08/24 1950    Cleotilde Rogue, MD 04/08/24 2027

## 2024-04-08 NOTE — ED Triage Notes (Signed)
 Patient reports she had some confusion this morning. Patient stated, I had trouble getting my words out. Patient also reports she began having tremors and head bobbing around 11am. Patient began having tingling in right arm around 1230 at [facility].

## 2024-04-08 NOTE — Nursing Note (Signed)
 Patient called out and requested to sign out against medical advice. Dr. Rosamond notified and went to room to talk with patient.

## 2024-04-08 NOTE — ED Notes (Signed)
 Pt ambulated to ED room. Visibly shaking. Denies pain at this time.

## 2024-04-08 NOTE — ED Notes (Signed)
 Pt has been sleeping since medication was given.  Pt states that she is feeling better.  No bobbing of her head at this time when sitting up.

## 2024-04-08 NOTE — ED Provider Notes (Signed)
 This patient is a 28 year old female presenting with uncontrolled movements of her head, she did have a fall yesterday where she fell on her back at the roller rink but did not hit her head.  She has had quite severe headaches in the past and has been treated by neurology.  At 9:00 today she did notice that she was having some slight tingling and a headache, that is resolved but now she is having difficulty controlling her head.  She does report having COVID last week but has completely resolved with those respiratory symptoms.  On exam the patient does have what appears to be titubation, no other acute findings on the neurologic exam with normal strength sensation cranial nerves etc.   I discussed the care with Dr. Michaela who recommends the patient be treated for a complicated migraine,, gauge improvement and he will make other recommendations.  Patient improved after cocktail, stable for discharge   Cleotilde Rogue, MD 04/08/24 2026

## 2024-04-09 LAB — VITAMIN B12: Vitamin B-12: 647 pg/mL (ref 180–914)
